# Patient Record
Sex: Female | Born: 1945 | Race: White | Hispanic: No | Smoking: Former smoker
Health system: Southern US, Community
[De-identification: ages and names within clinical notes are randomized; demographics above are authoritative.]

## PROBLEM LIST (undated history)

## (undated) DIAGNOSIS — I10 Essential (primary) hypertension: Secondary | ICD-10-CM

## (undated) DIAGNOSIS — K859 Acute pancreatitis without necrosis or infection, unspecified: Secondary | ICD-10-CM

## (undated) DIAGNOSIS — I4891 Unspecified atrial fibrillation: Secondary | ICD-10-CM

## (undated) DIAGNOSIS — B029 Zoster without complications: Secondary | ICD-10-CM

## (undated) HISTORY — DX: Zoster without complications: B02.9

## (undated) HISTORY — PX: CHOLECYSTECTOMY: SHX55

## (undated) HISTORY — DX: Acute pancreatitis without necrosis or infection, unspecified: K85.90

## (undated) HISTORY — DX: Essential (primary) hypertension: I10

## (undated) HISTORY — PX: OTHER SURGICAL HISTORY: SHX169

---

## 2012-01-29 DIAGNOSIS — I10 Essential (primary) hypertension: Secondary | ICD-10-CM | POA: Diagnosis not present

## 2012-05-06 DIAGNOSIS — J309 Allergic rhinitis, unspecified: Secondary | ICD-10-CM | POA: Diagnosis not present

## 2012-05-06 DIAGNOSIS — I10 Essential (primary) hypertension: Secondary | ICD-10-CM | POA: Diagnosis not present

## 2012-08-23 DIAGNOSIS — Z0000 Encounter for general adult medical examination without abnormal findings: Secondary | ICD-10-CM | POA: Diagnosis not present

## 2012-08-23 DIAGNOSIS — Z136 Encounter for screening for cardiovascular disorders: Secondary | ICD-10-CM | POA: Diagnosis not present

## 2012-08-23 DIAGNOSIS — I10 Essential (primary) hypertension: Secondary | ICD-10-CM | POA: Diagnosis not present

## 2012-08-23 DIAGNOSIS — Z13220 Encounter for screening for lipoid disorders: Secondary | ICD-10-CM | POA: Diagnosis not present

## 2012-11-25 DIAGNOSIS — I10 Essential (primary) hypertension: Secondary | ICD-10-CM | POA: Diagnosis not present

## 2013-05-09 DIAGNOSIS — I10 Essential (primary) hypertension: Secondary | ICD-10-CM | POA: Diagnosis not present

## 2013-08-09 DIAGNOSIS — I10 Essential (primary) hypertension: Secondary | ICD-10-CM | POA: Diagnosis not present

## 2013-08-09 DIAGNOSIS — Z131 Encounter for screening for diabetes mellitus: Secondary | ICD-10-CM | POA: Diagnosis not present

## 2013-11-17 DIAGNOSIS — I10 Essential (primary) hypertension: Secondary | ICD-10-CM | POA: Diagnosis not present

## 2014-01-20 DIAGNOSIS — M25569 Pain in unspecified knee: Secondary | ICD-10-CM | POA: Diagnosis not present

## 2014-01-20 DIAGNOSIS — J019 Acute sinusitis, unspecified: Secondary | ICD-10-CM | POA: Diagnosis not present

## 2014-05-25 DIAGNOSIS — Z1389 Encounter for screening for other disorder: Secondary | ICD-10-CM | POA: Diagnosis not present

## 2014-05-25 DIAGNOSIS — I10 Essential (primary) hypertension: Secondary | ICD-10-CM | POA: Diagnosis not present

## 2014-05-25 DIAGNOSIS — Z0000 Encounter for general adult medical examination without abnormal findings: Secondary | ICD-10-CM | POA: Diagnosis not present

## 2014-06-05 DIAGNOSIS — H25813 Combined forms of age-related cataract, bilateral: Secondary | ICD-10-CM | POA: Diagnosis not present

## 2014-06-20 DIAGNOSIS — Z1231 Encounter for screening mammogram for malignant neoplasm of breast: Secondary | ICD-10-CM | POA: Diagnosis not present

## 2014-06-20 DIAGNOSIS — Z23 Encounter for immunization: Secondary | ICD-10-CM | POA: Diagnosis not present

## 2014-07-14 HISTORY — PX: CATARACT EXTRACTION: SUR2

## 2014-11-29 DIAGNOSIS — H25813 Combined forms of age-related cataract, bilateral: Secondary | ICD-10-CM | POA: Diagnosis not present

## 2014-12-28 DIAGNOSIS — I10 Essential (primary) hypertension: Secondary | ICD-10-CM | POA: Diagnosis not present

## 2015-02-26 DIAGNOSIS — H18411 Arcus senilis, right eye: Secondary | ICD-10-CM | POA: Diagnosis not present

## 2015-02-26 DIAGNOSIS — H25011 Cortical age-related cataract, right eye: Secondary | ICD-10-CM | POA: Diagnosis not present

## 2015-02-26 DIAGNOSIS — H2511 Age-related nuclear cataract, right eye: Secondary | ICD-10-CM | POA: Diagnosis not present

## 2015-02-26 DIAGNOSIS — H269 Unspecified cataract: Secondary | ICD-10-CM | POA: Diagnosis not present

## 2015-02-26 DIAGNOSIS — H02839 Dermatochalasis of unspecified eye, unspecified eyelid: Secondary | ICD-10-CM | POA: Diagnosis not present

## 2015-03-30 DIAGNOSIS — N3080 Other cystitis without hematuria: Secondary | ICD-10-CM | POA: Diagnosis not present

## 2015-03-30 DIAGNOSIS — I10 Essential (primary) hypertension: Secondary | ICD-10-CM | POA: Diagnosis not present

## 2015-04-12 DIAGNOSIS — H2511 Age-related nuclear cataract, right eye: Secondary | ICD-10-CM | POA: Diagnosis not present

## 2015-04-12 DIAGNOSIS — H25811 Combined forms of age-related cataract, right eye: Secondary | ICD-10-CM | POA: Diagnosis not present

## 2015-04-13 DIAGNOSIS — H2512 Age-related nuclear cataract, left eye: Secondary | ICD-10-CM | POA: Diagnosis not present

## 2015-05-03 DIAGNOSIS — H25812 Combined forms of age-related cataract, left eye: Secondary | ICD-10-CM | POA: Diagnosis not present

## 2015-05-03 DIAGNOSIS — H2512 Age-related nuclear cataract, left eye: Secondary | ICD-10-CM | POA: Diagnosis not present

## 2015-07-02 DIAGNOSIS — Z131 Encounter for screening for diabetes mellitus: Secondary | ICD-10-CM | POA: Diagnosis not present

## 2015-07-02 DIAGNOSIS — Z0000 Encounter for general adult medical examination without abnormal findings: Secondary | ICD-10-CM | POA: Diagnosis not present

## 2015-07-02 DIAGNOSIS — Z23 Encounter for immunization: Secondary | ICD-10-CM | POA: Diagnosis not present

## 2015-07-02 DIAGNOSIS — Z1389 Encounter for screening for other disorder: Secondary | ICD-10-CM | POA: Diagnosis not present

## 2015-07-02 DIAGNOSIS — I10 Essential (primary) hypertension: Secondary | ICD-10-CM | POA: Diagnosis not present

## 2015-10-04 DIAGNOSIS — I10 Essential (primary) hypertension: Secondary | ICD-10-CM | POA: Diagnosis not present

## 2015-10-04 DIAGNOSIS — F3289 Other specified depressive episodes: Secondary | ICD-10-CM | POA: Diagnosis not present

## 2016-01-03 DIAGNOSIS — N952 Postmenopausal atrophic vaginitis: Secondary | ICD-10-CM | POA: Diagnosis not present

## 2016-01-03 DIAGNOSIS — I10 Essential (primary) hypertension: Secondary | ICD-10-CM | POA: Diagnosis not present

## 2016-01-03 DIAGNOSIS — F3289 Other specified depressive episodes: Secondary | ICD-10-CM | POA: Diagnosis not present

## 2016-04-07 DIAGNOSIS — I10 Essential (primary) hypertension: Secondary | ICD-10-CM | POA: Diagnosis not present

## 2016-04-07 DIAGNOSIS — F3289 Other specified depressive episodes: Secondary | ICD-10-CM | POA: Diagnosis not present

## 2016-07-10 DIAGNOSIS — Z1389 Encounter for screening for other disorder: Secondary | ICD-10-CM | POA: Diagnosis not present

## 2016-07-10 DIAGNOSIS — Z0000 Encounter for general adult medical examination without abnormal findings: Secondary | ICD-10-CM | POA: Diagnosis not present

## 2016-07-10 DIAGNOSIS — Z23 Encounter for immunization: Secondary | ICD-10-CM | POA: Diagnosis not present

## 2016-07-10 DIAGNOSIS — I10 Essential (primary) hypertension: Secondary | ICD-10-CM | POA: Diagnosis not present

## 2016-07-10 DIAGNOSIS — F3289 Other specified depressive episodes: Secondary | ICD-10-CM | POA: Diagnosis not present

## 2016-07-11 DIAGNOSIS — Z23 Encounter for immunization: Secondary | ICD-10-CM | POA: Diagnosis not present

## 2016-10-08 DIAGNOSIS — F3289 Other specified depressive episodes: Secondary | ICD-10-CM | POA: Diagnosis not present

## 2016-10-08 DIAGNOSIS — I10 Essential (primary) hypertension: Secondary | ICD-10-CM | POA: Diagnosis not present

## 2017-01-12 DIAGNOSIS — I10 Essential (primary) hypertension: Secondary | ICD-10-CM | POA: Diagnosis not present

## 2017-01-12 DIAGNOSIS — N3000 Acute cystitis without hematuria: Secondary | ICD-10-CM | POA: Diagnosis not present

## 2017-01-12 DIAGNOSIS — F3289 Other specified depressive episodes: Secondary | ICD-10-CM | POA: Diagnosis not present

## 2017-04-13 DIAGNOSIS — I10 Essential (primary) hypertension: Secondary | ICD-10-CM | POA: Diagnosis not present

## 2017-04-13 DIAGNOSIS — M5432 Sciatica, left side: Secondary | ICD-10-CM | POA: Diagnosis not present

## 2017-04-13 DIAGNOSIS — F3289 Other specified depressive episodes: Secondary | ICD-10-CM | POA: Diagnosis not present

## 2017-06-04 DIAGNOSIS — M79602 Pain in left arm: Secondary | ICD-10-CM | POA: Diagnosis not present

## 2017-06-04 DIAGNOSIS — S59902A Unspecified injury of left elbow, initial encounter: Secondary | ICD-10-CM | POA: Diagnosis not present

## 2017-06-04 DIAGNOSIS — T148XXA Other injury of unspecified body region, initial encounter: Secondary | ICD-10-CM | POA: Diagnosis not present

## 2017-06-04 DIAGNOSIS — M25522 Pain in left elbow: Secondary | ICD-10-CM | POA: Diagnosis not present

## 2017-06-04 DIAGNOSIS — I10 Essential (primary) hypertension: Secondary | ICD-10-CM | POA: Diagnosis not present

## 2017-06-04 DIAGNOSIS — S42212A Unspecified displaced fracture of surgical neck of left humerus, initial encounter for closed fracture: Secondary | ICD-10-CM | POA: Diagnosis not present

## 2017-06-04 DIAGNOSIS — W1830XA Fall on same level, unspecified, initial encounter: Secondary | ICD-10-CM | POA: Diagnosis not present

## 2017-06-04 DIAGNOSIS — S42202A Unspecified fracture of upper end of left humerus, initial encounter for closed fracture: Secondary | ICD-10-CM | POA: Diagnosis not present

## 2017-06-04 DIAGNOSIS — S42352A Displaced comminuted fracture of shaft of humerus, left arm, initial encounter for closed fracture: Secondary | ICD-10-CM | POA: Diagnosis not present

## 2017-06-04 DIAGNOSIS — Z885 Allergy status to narcotic agent status: Secondary | ICD-10-CM | POA: Diagnosis not present

## 2017-06-04 DIAGNOSIS — M79632 Pain in left forearm: Secondary | ICD-10-CM | POA: Diagnosis not present

## 2017-06-04 DIAGNOSIS — S59912A Unspecified injury of left forearm, initial encounter: Secondary | ICD-10-CM | POA: Diagnosis not present

## 2017-06-04 DIAGNOSIS — Z882 Allergy status to sulfonamides status: Secondary | ICD-10-CM | POA: Diagnosis not present

## 2017-06-05 DIAGNOSIS — S42202A Unspecified fracture of upper end of left humerus, initial encounter for closed fracture: Secondary | ICD-10-CM | POA: Diagnosis not present

## 2017-06-05 DIAGNOSIS — S4292XA Fracture of left shoulder girdle, part unspecified, initial encounter for closed fracture: Secondary | ICD-10-CM | POA: Diagnosis not present

## 2017-06-05 DIAGNOSIS — S143XXA Injury of brachial plexus, initial encounter: Secondary | ICD-10-CM | POA: Diagnosis not present

## 2017-06-05 DIAGNOSIS — I10 Essential (primary) hypertension: Secondary | ICD-10-CM | POA: Diagnosis not present

## 2017-06-05 DIAGNOSIS — Z01810 Encounter for preprocedural cardiovascular examination: Secondary | ICD-10-CM | POA: Diagnosis not present

## 2017-06-05 DIAGNOSIS — Z01818 Encounter for other preprocedural examination: Secondary | ICD-10-CM | POA: Diagnosis not present

## 2017-06-05 DIAGNOSIS — W11XXXA Fall on and from ladder, initial encounter: Secondary | ICD-10-CM | POA: Diagnosis not present

## 2017-06-05 DIAGNOSIS — S42112S Displaced fracture of body of scapula, left shoulder, sequela: Secondary | ICD-10-CM | POA: Diagnosis not present

## 2017-06-05 DIAGNOSIS — S42292A Other displaced fracture of upper end of left humerus, initial encounter for closed fracture: Secondary | ICD-10-CM | POA: Diagnosis not present

## 2017-06-05 DIAGNOSIS — Z0000 Encounter for general adult medical examination without abnormal findings: Secondary | ICD-10-CM | POA: Diagnosis not present

## 2017-06-05 DIAGNOSIS — R001 Bradycardia, unspecified: Secondary | ICD-10-CM | POA: Diagnosis not present

## 2017-06-09 DIAGNOSIS — S143XXA Injury of brachial plexus, initial encounter: Secondary | ICD-10-CM | POA: Diagnosis not present

## 2017-06-09 DIAGNOSIS — S43005A Unspecified dislocation of left shoulder joint, initial encounter: Secondary | ICD-10-CM | POA: Diagnosis not present

## 2017-06-09 DIAGNOSIS — S42202A Unspecified fracture of upper end of left humerus, initial encounter for closed fracture: Secondary | ICD-10-CM | POA: Diagnosis not present

## 2017-06-10 DIAGNOSIS — M84412A Pathological fracture, left shoulder, initial encounter for fracture: Secondary | ICD-10-CM | POA: Diagnosis not present

## 2017-06-10 DIAGNOSIS — S143XXA Injury of brachial plexus, initial encounter: Secondary | ICD-10-CM | POA: Diagnosis present

## 2017-06-10 DIAGNOSIS — S42202A Unspecified fracture of upper end of left humerus, initial encounter for closed fracture: Secondary | ICD-10-CM | POA: Diagnosis not present

## 2017-06-10 DIAGNOSIS — M25512 Pain in left shoulder: Secondary | ICD-10-CM | POA: Diagnosis not present

## 2017-06-10 DIAGNOSIS — S42242A 4-part fracture of surgical neck of left humerus, initial encounter for closed fracture: Secondary | ICD-10-CM | POA: Diagnosis present

## 2017-06-10 DIAGNOSIS — Z96612 Presence of left artificial shoulder joint: Secondary | ICD-10-CM | POA: Diagnosis not present

## 2017-06-10 DIAGNOSIS — I10 Essential (primary) hypertension: Secondary | ICD-10-CM | POA: Diagnosis present

## 2017-06-11 DIAGNOSIS — Z96612 Presence of left artificial shoulder joint: Secondary | ICD-10-CM | POA: Insufficient documentation

## 2017-06-12 DIAGNOSIS — Z96612 Presence of left artificial shoulder joint: Secondary | ICD-10-CM | POA: Diagnosis not present

## 2017-06-12 DIAGNOSIS — Z7982 Long term (current) use of aspirin: Secondary | ICD-10-CM | POA: Diagnosis not present

## 2017-06-12 DIAGNOSIS — Z79891 Long term (current) use of opiate analgesic: Secondary | ICD-10-CM | POA: Diagnosis not present

## 2017-06-12 DIAGNOSIS — R2689 Other abnormalities of gait and mobility: Secondary | ICD-10-CM | POA: Diagnosis not present

## 2017-06-12 DIAGNOSIS — Z471 Aftercare following joint replacement surgery: Secondary | ICD-10-CM | POA: Diagnosis not present

## 2017-06-12 DIAGNOSIS — Z9049 Acquired absence of other specified parts of digestive tract: Secondary | ICD-10-CM | POA: Diagnosis not present

## 2017-06-19 DIAGNOSIS — Z471 Aftercare following joint replacement surgery: Secondary | ICD-10-CM | POA: Diagnosis not present

## 2017-06-19 DIAGNOSIS — Z9049 Acquired absence of other specified parts of digestive tract: Secondary | ICD-10-CM | POA: Diagnosis not present

## 2017-06-19 DIAGNOSIS — Z96612 Presence of left artificial shoulder joint: Secondary | ICD-10-CM | POA: Diagnosis not present

## 2017-06-19 DIAGNOSIS — R2689 Other abnormalities of gait and mobility: Secondary | ICD-10-CM | POA: Diagnosis not present

## 2017-06-19 DIAGNOSIS — Z79891 Long term (current) use of opiate analgesic: Secondary | ICD-10-CM | POA: Diagnosis not present

## 2017-06-19 DIAGNOSIS — Z7982 Long term (current) use of aspirin: Secondary | ICD-10-CM | POA: Diagnosis not present

## 2017-06-25 DIAGNOSIS — R2689 Other abnormalities of gait and mobility: Secondary | ICD-10-CM | POA: Diagnosis not present

## 2017-06-25 DIAGNOSIS — Z7982 Long term (current) use of aspirin: Secondary | ICD-10-CM | POA: Diagnosis not present

## 2017-06-25 DIAGNOSIS — Z96612 Presence of left artificial shoulder joint: Secondary | ICD-10-CM | POA: Diagnosis not present

## 2017-06-25 DIAGNOSIS — Z9049 Acquired absence of other specified parts of digestive tract: Secondary | ICD-10-CM | POA: Diagnosis not present

## 2017-06-25 DIAGNOSIS — Z79891 Long term (current) use of opiate analgesic: Secondary | ICD-10-CM | POA: Diagnosis not present

## 2017-06-25 DIAGNOSIS — Z471 Aftercare following joint replacement surgery: Secondary | ICD-10-CM | POA: Diagnosis not present

## 2017-06-29 DIAGNOSIS — Z471 Aftercare following joint replacement surgery: Secondary | ICD-10-CM | POA: Diagnosis not present

## 2017-07-01 DIAGNOSIS — Z7982 Long term (current) use of aspirin: Secondary | ICD-10-CM | POA: Diagnosis not present

## 2017-07-01 DIAGNOSIS — Z96612 Presence of left artificial shoulder joint: Secondary | ICD-10-CM | POA: Diagnosis not present

## 2017-07-01 DIAGNOSIS — Z9049 Acquired absence of other specified parts of digestive tract: Secondary | ICD-10-CM | POA: Diagnosis not present

## 2017-07-01 DIAGNOSIS — Z79891 Long term (current) use of opiate analgesic: Secondary | ICD-10-CM | POA: Diagnosis not present

## 2017-07-01 DIAGNOSIS — R2689 Other abnormalities of gait and mobility: Secondary | ICD-10-CM | POA: Diagnosis not present

## 2017-07-01 DIAGNOSIS — Z471 Aftercare following joint replacement surgery: Secondary | ICD-10-CM | POA: Diagnosis not present

## 2017-07-10 DIAGNOSIS — Z96612 Presence of left artificial shoulder joint: Secondary | ICD-10-CM | POA: Diagnosis not present

## 2017-07-10 DIAGNOSIS — Z471 Aftercare following joint replacement surgery: Secondary | ICD-10-CM | POA: Diagnosis not present

## 2017-07-10 DIAGNOSIS — R2689 Other abnormalities of gait and mobility: Secondary | ICD-10-CM | POA: Diagnosis not present

## 2017-07-10 DIAGNOSIS — Z9049 Acquired absence of other specified parts of digestive tract: Secondary | ICD-10-CM | POA: Diagnosis not present

## 2017-07-10 DIAGNOSIS — Z79891 Long term (current) use of opiate analgesic: Secondary | ICD-10-CM | POA: Diagnosis not present

## 2017-07-10 DIAGNOSIS — Z7982 Long term (current) use of aspirin: Secondary | ICD-10-CM | POA: Diagnosis not present

## 2017-07-15 DIAGNOSIS — I10 Essential (primary) hypertension: Secondary | ICD-10-CM | POA: Diagnosis not present

## 2017-07-15 DIAGNOSIS — M5432 Sciatica, left side: Secondary | ICD-10-CM | POA: Diagnosis not present

## 2017-07-15 DIAGNOSIS — F3289 Other specified depressive episodes: Secondary | ICD-10-CM | POA: Diagnosis not present

## 2017-07-15 DIAGNOSIS — M14812 Arthropathies in other specified diseases classified elsewhere, left shoulder: Secondary | ICD-10-CM | POA: Diagnosis not present

## 2017-07-16 DIAGNOSIS — Z7982 Long term (current) use of aspirin: Secondary | ICD-10-CM | POA: Diagnosis not present

## 2017-07-16 DIAGNOSIS — Z96612 Presence of left artificial shoulder joint: Secondary | ICD-10-CM | POA: Diagnosis not present

## 2017-07-16 DIAGNOSIS — Z79891 Long term (current) use of opiate analgesic: Secondary | ICD-10-CM | POA: Diagnosis not present

## 2017-07-16 DIAGNOSIS — R2689 Other abnormalities of gait and mobility: Secondary | ICD-10-CM | POA: Diagnosis not present

## 2017-07-16 DIAGNOSIS — Z471 Aftercare following joint replacement surgery: Secondary | ICD-10-CM | POA: Diagnosis not present

## 2017-07-16 DIAGNOSIS — Z9049 Acquired absence of other specified parts of digestive tract: Secondary | ICD-10-CM | POA: Diagnosis not present

## 2017-07-22 DIAGNOSIS — Z471 Aftercare following joint replacement surgery: Secondary | ICD-10-CM | POA: Diagnosis not present

## 2017-07-22 DIAGNOSIS — Z9049 Acquired absence of other specified parts of digestive tract: Secondary | ICD-10-CM | POA: Diagnosis not present

## 2017-07-22 DIAGNOSIS — R2689 Other abnormalities of gait and mobility: Secondary | ICD-10-CM | POA: Diagnosis not present

## 2017-07-22 DIAGNOSIS — Z7982 Long term (current) use of aspirin: Secondary | ICD-10-CM | POA: Diagnosis not present

## 2017-07-22 DIAGNOSIS — Z79891 Long term (current) use of opiate analgesic: Secondary | ICD-10-CM | POA: Diagnosis not present

## 2017-07-22 DIAGNOSIS — Z96612 Presence of left artificial shoulder joint: Secondary | ICD-10-CM | POA: Diagnosis not present

## 2017-07-24 DIAGNOSIS — Z7982 Long term (current) use of aspirin: Secondary | ICD-10-CM | POA: Diagnosis not present

## 2017-07-24 DIAGNOSIS — Z79891 Long term (current) use of opiate analgesic: Secondary | ICD-10-CM | POA: Diagnosis not present

## 2017-07-24 DIAGNOSIS — Z9049 Acquired absence of other specified parts of digestive tract: Secondary | ICD-10-CM | POA: Diagnosis not present

## 2017-07-24 DIAGNOSIS — Z471 Aftercare following joint replacement surgery: Secondary | ICD-10-CM | POA: Diagnosis not present

## 2017-07-24 DIAGNOSIS — R2689 Other abnormalities of gait and mobility: Secondary | ICD-10-CM | POA: Diagnosis not present

## 2017-07-24 DIAGNOSIS — Z96612 Presence of left artificial shoulder joint: Secondary | ICD-10-CM | POA: Diagnosis not present

## 2017-07-27 DIAGNOSIS — Z9049 Acquired absence of other specified parts of digestive tract: Secondary | ICD-10-CM | POA: Diagnosis not present

## 2017-07-27 DIAGNOSIS — R2689 Other abnormalities of gait and mobility: Secondary | ICD-10-CM | POA: Diagnosis not present

## 2017-07-27 DIAGNOSIS — Z7982 Long term (current) use of aspirin: Secondary | ICD-10-CM | POA: Diagnosis not present

## 2017-07-27 DIAGNOSIS — Z79891 Long term (current) use of opiate analgesic: Secondary | ICD-10-CM | POA: Diagnosis not present

## 2017-07-27 DIAGNOSIS — Z96612 Presence of left artificial shoulder joint: Secondary | ICD-10-CM | POA: Diagnosis not present

## 2017-07-27 DIAGNOSIS — Z471 Aftercare following joint replacement surgery: Secondary | ICD-10-CM | POA: Diagnosis not present

## 2017-07-28 DIAGNOSIS — Z96612 Presence of left artificial shoulder joint: Secondary | ICD-10-CM | POA: Diagnosis not present

## 2017-07-28 DIAGNOSIS — Z471 Aftercare following joint replacement surgery: Secondary | ICD-10-CM | POA: Diagnosis not present

## 2017-07-30 DIAGNOSIS — Z7982 Long term (current) use of aspirin: Secondary | ICD-10-CM | POA: Diagnosis not present

## 2017-07-30 DIAGNOSIS — Z96612 Presence of left artificial shoulder joint: Secondary | ICD-10-CM | POA: Diagnosis not present

## 2017-07-30 DIAGNOSIS — Z9049 Acquired absence of other specified parts of digestive tract: Secondary | ICD-10-CM | POA: Diagnosis not present

## 2017-07-30 DIAGNOSIS — R2689 Other abnormalities of gait and mobility: Secondary | ICD-10-CM | POA: Diagnosis not present

## 2017-07-30 DIAGNOSIS — Z471 Aftercare following joint replacement surgery: Secondary | ICD-10-CM | POA: Diagnosis not present

## 2017-07-30 DIAGNOSIS — Z79891 Long term (current) use of opiate analgesic: Secondary | ICD-10-CM | POA: Diagnosis not present

## 2017-07-31 DIAGNOSIS — Z79891 Long term (current) use of opiate analgesic: Secondary | ICD-10-CM | POA: Diagnosis not present

## 2017-07-31 DIAGNOSIS — R2689 Other abnormalities of gait and mobility: Secondary | ICD-10-CM | POA: Diagnosis not present

## 2017-07-31 DIAGNOSIS — Z9049 Acquired absence of other specified parts of digestive tract: Secondary | ICD-10-CM | POA: Diagnosis not present

## 2017-07-31 DIAGNOSIS — Z96612 Presence of left artificial shoulder joint: Secondary | ICD-10-CM | POA: Diagnosis not present

## 2017-07-31 DIAGNOSIS — Z7982 Long term (current) use of aspirin: Secondary | ICD-10-CM | POA: Diagnosis not present

## 2017-07-31 DIAGNOSIS — Z471 Aftercare following joint replacement surgery: Secondary | ICD-10-CM | POA: Diagnosis not present

## 2017-08-04 DIAGNOSIS — Z7982 Long term (current) use of aspirin: Secondary | ICD-10-CM | POA: Diagnosis not present

## 2017-08-04 DIAGNOSIS — Z9049 Acquired absence of other specified parts of digestive tract: Secondary | ICD-10-CM | POA: Diagnosis not present

## 2017-08-04 DIAGNOSIS — Z79891 Long term (current) use of opiate analgesic: Secondary | ICD-10-CM | POA: Diagnosis not present

## 2017-08-04 DIAGNOSIS — Z471 Aftercare following joint replacement surgery: Secondary | ICD-10-CM | POA: Diagnosis not present

## 2017-08-04 DIAGNOSIS — R2689 Other abnormalities of gait and mobility: Secondary | ICD-10-CM | POA: Diagnosis not present

## 2017-08-04 DIAGNOSIS — Z96612 Presence of left artificial shoulder joint: Secondary | ICD-10-CM | POA: Diagnosis not present

## 2017-08-06 DIAGNOSIS — Z9049 Acquired absence of other specified parts of digestive tract: Secondary | ICD-10-CM | POA: Diagnosis not present

## 2017-08-06 DIAGNOSIS — R2689 Other abnormalities of gait and mobility: Secondary | ICD-10-CM | POA: Diagnosis not present

## 2017-08-06 DIAGNOSIS — Z7982 Long term (current) use of aspirin: Secondary | ICD-10-CM | POA: Diagnosis not present

## 2017-08-06 DIAGNOSIS — Z79891 Long term (current) use of opiate analgesic: Secondary | ICD-10-CM | POA: Diagnosis not present

## 2017-08-06 DIAGNOSIS — Z96612 Presence of left artificial shoulder joint: Secondary | ICD-10-CM | POA: Diagnosis not present

## 2017-08-06 DIAGNOSIS — Z471 Aftercare following joint replacement surgery: Secondary | ICD-10-CM | POA: Diagnosis not present

## 2017-08-07 DIAGNOSIS — R2689 Other abnormalities of gait and mobility: Secondary | ICD-10-CM | POA: Diagnosis not present

## 2017-08-07 DIAGNOSIS — Z7982 Long term (current) use of aspirin: Secondary | ICD-10-CM | POA: Diagnosis not present

## 2017-08-07 DIAGNOSIS — Z79891 Long term (current) use of opiate analgesic: Secondary | ICD-10-CM | POA: Diagnosis not present

## 2017-08-07 DIAGNOSIS — Z9049 Acquired absence of other specified parts of digestive tract: Secondary | ICD-10-CM | POA: Diagnosis not present

## 2017-08-07 DIAGNOSIS — Z471 Aftercare following joint replacement surgery: Secondary | ICD-10-CM | POA: Diagnosis not present

## 2017-08-07 DIAGNOSIS — Z96612 Presence of left artificial shoulder joint: Secondary | ICD-10-CM | POA: Diagnosis not present

## 2017-08-10 DIAGNOSIS — Z7982 Long term (current) use of aspirin: Secondary | ICD-10-CM | POA: Diagnosis not present

## 2017-08-10 DIAGNOSIS — Z9049 Acquired absence of other specified parts of digestive tract: Secondary | ICD-10-CM | POA: Diagnosis not present

## 2017-08-10 DIAGNOSIS — R2689 Other abnormalities of gait and mobility: Secondary | ICD-10-CM | POA: Diagnosis not present

## 2017-08-10 DIAGNOSIS — Z79891 Long term (current) use of opiate analgesic: Secondary | ICD-10-CM | POA: Diagnosis not present

## 2017-08-10 DIAGNOSIS — Z96612 Presence of left artificial shoulder joint: Secondary | ICD-10-CM | POA: Diagnosis not present

## 2017-08-10 DIAGNOSIS — Z471 Aftercare following joint replacement surgery: Secondary | ICD-10-CM | POA: Diagnosis not present

## 2017-08-11 DIAGNOSIS — I10 Essential (primary) hypertension: Secondary | ICD-10-CM | POA: Diagnosis not present

## 2017-08-11 DIAGNOSIS — Z9049 Acquired absence of other specified parts of digestive tract: Secondary | ICD-10-CM | POA: Diagnosis not present

## 2017-08-11 DIAGNOSIS — Z7982 Long term (current) use of aspirin: Secondary | ICD-10-CM | POA: Diagnosis not present

## 2017-08-11 DIAGNOSIS — R2689 Other abnormalities of gait and mobility: Secondary | ICD-10-CM | POA: Diagnosis not present

## 2017-08-11 DIAGNOSIS — Z96612 Presence of left artificial shoulder joint: Secondary | ICD-10-CM | POA: Diagnosis not present

## 2017-08-11 DIAGNOSIS — Z471 Aftercare following joint replacement surgery: Secondary | ICD-10-CM | POA: Diagnosis not present

## 2017-08-11 DIAGNOSIS — Z79891 Long term (current) use of opiate analgesic: Secondary | ICD-10-CM | POA: Diagnosis not present

## 2017-08-13 DIAGNOSIS — Z9049 Acquired absence of other specified parts of digestive tract: Secondary | ICD-10-CM | POA: Diagnosis not present

## 2017-08-13 DIAGNOSIS — Z96612 Presence of left artificial shoulder joint: Secondary | ICD-10-CM | POA: Diagnosis not present

## 2017-08-13 DIAGNOSIS — Z79891 Long term (current) use of opiate analgesic: Secondary | ICD-10-CM | POA: Diagnosis not present

## 2017-08-13 DIAGNOSIS — R2689 Other abnormalities of gait and mobility: Secondary | ICD-10-CM | POA: Diagnosis not present

## 2017-08-13 DIAGNOSIS — Z471 Aftercare following joint replacement surgery: Secondary | ICD-10-CM | POA: Diagnosis not present

## 2017-08-13 DIAGNOSIS — Z7982 Long term (current) use of aspirin: Secondary | ICD-10-CM | POA: Diagnosis not present

## 2017-08-14 DIAGNOSIS — Z7982 Long term (current) use of aspirin: Secondary | ICD-10-CM | POA: Diagnosis not present

## 2017-08-14 DIAGNOSIS — R2689 Other abnormalities of gait and mobility: Secondary | ICD-10-CM | POA: Diagnosis not present

## 2017-08-14 DIAGNOSIS — Z471 Aftercare following joint replacement surgery: Secondary | ICD-10-CM | POA: Diagnosis not present

## 2017-08-14 DIAGNOSIS — Z9049 Acquired absence of other specified parts of digestive tract: Secondary | ICD-10-CM | POA: Diagnosis not present

## 2017-08-14 DIAGNOSIS — Z79891 Long term (current) use of opiate analgesic: Secondary | ICD-10-CM | POA: Diagnosis not present

## 2017-08-14 DIAGNOSIS — Z96612 Presence of left artificial shoulder joint: Secondary | ICD-10-CM | POA: Diagnosis not present

## 2017-08-17 DIAGNOSIS — Z7982 Long term (current) use of aspirin: Secondary | ICD-10-CM | POA: Diagnosis not present

## 2017-08-17 DIAGNOSIS — Z471 Aftercare following joint replacement surgery: Secondary | ICD-10-CM | POA: Diagnosis not present

## 2017-08-17 DIAGNOSIS — Z96612 Presence of left artificial shoulder joint: Secondary | ICD-10-CM | POA: Diagnosis not present

## 2017-08-17 DIAGNOSIS — R2689 Other abnormalities of gait and mobility: Secondary | ICD-10-CM | POA: Diagnosis not present

## 2017-08-17 DIAGNOSIS — Z79891 Long term (current) use of opiate analgesic: Secondary | ICD-10-CM | POA: Diagnosis not present

## 2017-08-17 DIAGNOSIS — Z9049 Acquired absence of other specified parts of digestive tract: Secondary | ICD-10-CM | POA: Diagnosis not present

## 2017-08-19 DIAGNOSIS — Z96612 Presence of left artificial shoulder joint: Secondary | ICD-10-CM | POA: Diagnosis not present

## 2017-08-19 DIAGNOSIS — Z7982 Long term (current) use of aspirin: Secondary | ICD-10-CM | POA: Diagnosis not present

## 2017-08-19 DIAGNOSIS — R2689 Other abnormalities of gait and mobility: Secondary | ICD-10-CM | POA: Diagnosis not present

## 2017-08-19 DIAGNOSIS — Z9049 Acquired absence of other specified parts of digestive tract: Secondary | ICD-10-CM | POA: Diagnosis not present

## 2017-08-19 DIAGNOSIS — Z79891 Long term (current) use of opiate analgesic: Secondary | ICD-10-CM | POA: Diagnosis not present

## 2017-08-19 DIAGNOSIS — Z471 Aftercare following joint replacement surgery: Secondary | ICD-10-CM | POA: Diagnosis not present

## 2017-08-25 DIAGNOSIS — J40 Bronchitis, not specified as acute or chronic: Secondary | ICD-10-CM | POA: Diagnosis not present

## 2017-08-27 DIAGNOSIS — Z471 Aftercare following joint replacement surgery: Secondary | ICD-10-CM | POA: Diagnosis not present

## 2017-08-27 DIAGNOSIS — R2689 Other abnormalities of gait and mobility: Secondary | ICD-10-CM | POA: Diagnosis not present

## 2017-08-27 DIAGNOSIS — Z79891 Long term (current) use of opiate analgesic: Secondary | ICD-10-CM | POA: Diagnosis not present

## 2017-08-27 DIAGNOSIS — Z7982 Long term (current) use of aspirin: Secondary | ICD-10-CM | POA: Diagnosis not present

## 2017-08-27 DIAGNOSIS — Z96612 Presence of left artificial shoulder joint: Secondary | ICD-10-CM | POA: Diagnosis not present

## 2017-08-27 DIAGNOSIS — Z9049 Acquired absence of other specified parts of digestive tract: Secondary | ICD-10-CM | POA: Diagnosis not present

## 2017-08-31 DIAGNOSIS — Z7982 Long term (current) use of aspirin: Secondary | ICD-10-CM | POA: Diagnosis not present

## 2017-08-31 DIAGNOSIS — Z9049 Acquired absence of other specified parts of digestive tract: Secondary | ICD-10-CM | POA: Diagnosis not present

## 2017-08-31 DIAGNOSIS — Z471 Aftercare following joint replacement surgery: Secondary | ICD-10-CM | POA: Diagnosis not present

## 2017-08-31 DIAGNOSIS — Z79891 Long term (current) use of opiate analgesic: Secondary | ICD-10-CM | POA: Diagnosis not present

## 2017-08-31 DIAGNOSIS — Z96612 Presence of left artificial shoulder joint: Secondary | ICD-10-CM | POA: Diagnosis not present

## 2017-08-31 DIAGNOSIS — R2689 Other abnormalities of gait and mobility: Secondary | ICD-10-CM | POA: Diagnosis not present

## 2017-09-02 DIAGNOSIS — Z79891 Long term (current) use of opiate analgesic: Secondary | ICD-10-CM | POA: Diagnosis not present

## 2017-09-02 DIAGNOSIS — R2689 Other abnormalities of gait and mobility: Secondary | ICD-10-CM | POA: Diagnosis not present

## 2017-09-02 DIAGNOSIS — Z9049 Acquired absence of other specified parts of digestive tract: Secondary | ICD-10-CM | POA: Diagnosis not present

## 2017-09-02 DIAGNOSIS — Z7982 Long term (current) use of aspirin: Secondary | ICD-10-CM | POA: Diagnosis not present

## 2017-09-02 DIAGNOSIS — Z471 Aftercare following joint replacement surgery: Secondary | ICD-10-CM | POA: Diagnosis not present

## 2017-09-02 DIAGNOSIS — Z96612 Presence of left artificial shoulder joint: Secondary | ICD-10-CM | POA: Diagnosis not present

## 2017-09-04 DIAGNOSIS — R2689 Other abnormalities of gait and mobility: Secondary | ICD-10-CM | POA: Diagnosis not present

## 2017-09-04 DIAGNOSIS — Z471 Aftercare following joint replacement surgery: Secondary | ICD-10-CM | POA: Diagnosis not present

## 2017-09-04 DIAGNOSIS — Z96612 Presence of left artificial shoulder joint: Secondary | ICD-10-CM | POA: Diagnosis not present

## 2017-09-04 DIAGNOSIS — Z9049 Acquired absence of other specified parts of digestive tract: Secondary | ICD-10-CM | POA: Diagnosis not present

## 2017-09-04 DIAGNOSIS — Z79891 Long term (current) use of opiate analgesic: Secondary | ICD-10-CM | POA: Diagnosis not present

## 2017-09-04 DIAGNOSIS — Z7982 Long term (current) use of aspirin: Secondary | ICD-10-CM | POA: Diagnosis not present

## 2017-09-08 DIAGNOSIS — S42292A Other displaced fracture of upper end of left humerus, initial encounter for closed fracture: Secondary | ICD-10-CM | POA: Diagnosis not present

## 2017-09-08 DIAGNOSIS — Z96612 Presence of left artificial shoulder joint: Secondary | ICD-10-CM | POA: Diagnosis not present

## 2017-09-08 DIAGNOSIS — Z471 Aftercare following joint replacement surgery: Secondary | ICD-10-CM | POA: Diagnosis not present

## 2017-09-08 DIAGNOSIS — Z0000 Encounter for general adult medical examination without abnormal findings: Secondary | ICD-10-CM | POA: Diagnosis not present

## 2017-09-09 DIAGNOSIS — Z471 Aftercare following joint replacement surgery: Secondary | ICD-10-CM | POA: Diagnosis not present

## 2017-09-09 DIAGNOSIS — Z7982 Long term (current) use of aspirin: Secondary | ICD-10-CM | POA: Diagnosis not present

## 2017-09-09 DIAGNOSIS — Z96612 Presence of left artificial shoulder joint: Secondary | ICD-10-CM | POA: Diagnosis not present

## 2017-09-09 DIAGNOSIS — Z9049 Acquired absence of other specified parts of digestive tract: Secondary | ICD-10-CM | POA: Diagnosis not present

## 2017-09-09 DIAGNOSIS — R2689 Other abnormalities of gait and mobility: Secondary | ICD-10-CM | POA: Diagnosis not present

## 2017-09-09 DIAGNOSIS — Z79891 Long term (current) use of opiate analgesic: Secondary | ICD-10-CM | POA: Diagnosis not present

## 2017-09-11 DIAGNOSIS — Z7982 Long term (current) use of aspirin: Secondary | ICD-10-CM | POA: Diagnosis not present

## 2017-09-11 DIAGNOSIS — Z96612 Presence of left artificial shoulder joint: Secondary | ICD-10-CM | POA: Diagnosis not present

## 2017-09-11 DIAGNOSIS — Z9049 Acquired absence of other specified parts of digestive tract: Secondary | ICD-10-CM | POA: Diagnosis not present

## 2017-09-11 DIAGNOSIS — R2689 Other abnormalities of gait and mobility: Secondary | ICD-10-CM | POA: Diagnosis not present

## 2017-09-11 DIAGNOSIS — Z79891 Long term (current) use of opiate analgesic: Secondary | ICD-10-CM | POA: Diagnosis not present

## 2017-09-11 DIAGNOSIS — Z471 Aftercare following joint replacement surgery: Secondary | ICD-10-CM | POA: Diagnosis not present

## 2017-09-15 DIAGNOSIS — Z471 Aftercare following joint replacement surgery: Secondary | ICD-10-CM | POA: Diagnosis not present

## 2017-09-15 DIAGNOSIS — R2689 Other abnormalities of gait and mobility: Secondary | ICD-10-CM | POA: Diagnosis not present

## 2017-09-15 DIAGNOSIS — Z7982 Long term (current) use of aspirin: Secondary | ICD-10-CM | POA: Diagnosis not present

## 2017-09-15 DIAGNOSIS — Z9049 Acquired absence of other specified parts of digestive tract: Secondary | ICD-10-CM | POA: Diagnosis not present

## 2017-09-15 DIAGNOSIS — Z79891 Long term (current) use of opiate analgesic: Secondary | ICD-10-CM | POA: Diagnosis not present

## 2017-09-15 DIAGNOSIS — Z96612 Presence of left artificial shoulder joint: Secondary | ICD-10-CM | POA: Diagnosis not present

## 2017-09-18 DIAGNOSIS — Z96612 Presence of left artificial shoulder joint: Secondary | ICD-10-CM | POA: Diagnosis not present

## 2017-09-18 DIAGNOSIS — Z7982 Long term (current) use of aspirin: Secondary | ICD-10-CM | POA: Diagnosis not present

## 2017-09-18 DIAGNOSIS — R2689 Other abnormalities of gait and mobility: Secondary | ICD-10-CM | POA: Diagnosis not present

## 2017-09-18 DIAGNOSIS — Z9049 Acquired absence of other specified parts of digestive tract: Secondary | ICD-10-CM | POA: Diagnosis not present

## 2017-09-18 DIAGNOSIS — Z79891 Long term (current) use of opiate analgesic: Secondary | ICD-10-CM | POA: Diagnosis not present

## 2017-09-18 DIAGNOSIS — Z471 Aftercare following joint replacement surgery: Secondary | ICD-10-CM | POA: Diagnosis not present

## 2017-09-23 DIAGNOSIS — Z79891 Long term (current) use of opiate analgesic: Secondary | ICD-10-CM | POA: Diagnosis not present

## 2017-09-23 DIAGNOSIS — Z471 Aftercare following joint replacement surgery: Secondary | ICD-10-CM | POA: Diagnosis not present

## 2017-09-23 DIAGNOSIS — Z7982 Long term (current) use of aspirin: Secondary | ICD-10-CM | POA: Diagnosis not present

## 2017-09-23 DIAGNOSIS — R2689 Other abnormalities of gait and mobility: Secondary | ICD-10-CM | POA: Diagnosis not present

## 2017-09-23 DIAGNOSIS — Z96612 Presence of left artificial shoulder joint: Secondary | ICD-10-CM | POA: Diagnosis not present

## 2017-09-23 DIAGNOSIS — Z9049 Acquired absence of other specified parts of digestive tract: Secondary | ICD-10-CM | POA: Diagnosis not present

## 2017-09-24 DIAGNOSIS — Z96612 Presence of left artificial shoulder joint: Secondary | ICD-10-CM | POA: Diagnosis not present

## 2017-09-24 DIAGNOSIS — Z7982 Long term (current) use of aspirin: Secondary | ICD-10-CM | POA: Diagnosis not present

## 2017-09-24 DIAGNOSIS — Z471 Aftercare following joint replacement surgery: Secondary | ICD-10-CM | POA: Diagnosis not present

## 2017-09-24 DIAGNOSIS — Z9049 Acquired absence of other specified parts of digestive tract: Secondary | ICD-10-CM | POA: Diagnosis not present

## 2017-09-24 DIAGNOSIS — R2689 Other abnormalities of gait and mobility: Secondary | ICD-10-CM | POA: Diagnosis not present

## 2017-09-24 DIAGNOSIS — Z79891 Long term (current) use of opiate analgesic: Secondary | ICD-10-CM | POA: Diagnosis not present

## 2017-09-28 DIAGNOSIS — Z471 Aftercare following joint replacement surgery: Secondary | ICD-10-CM | POA: Diagnosis not present

## 2017-09-28 DIAGNOSIS — Z9049 Acquired absence of other specified parts of digestive tract: Secondary | ICD-10-CM | POA: Diagnosis not present

## 2017-09-28 DIAGNOSIS — Z96612 Presence of left artificial shoulder joint: Secondary | ICD-10-CM | POA: Diagnosis not present

## 2017-09-28 DIAGNOSIS — Z79891 Long term (current) use of opiate analgesic: Secondary | ICD-10-CM | POA: Diagnosis not present

## 2017-09-28 DIAGNOSIS — R2689 Other abnormalities of gait and mobility: Secondary | ICD-10-CM | POA: Diagnosis not present

## 2017-09-28 DIAGNOSIS — Z7982 Long term (current) use of aspirin: Secondary | ICD-10-CM | POA: Diagnosis not present

## 2017-09-30 DIAGNOSIS — R2689 Other abnormalities of gait and mobility: Secondary | ICD-10-CM | POA: Diagnosis not present

## 2017-09-30 DIAGNOSIS — Z96612 Presence of left artificial shoulder joint: Secondary | ICD-10-CM | POA: Diagnosis not present

## 2017-09-30 DIAGNOSIS — Z7982 Long term (current) use of aspirin: Secondary | ICD-10-CM | POA: Diagnosis not present

## 2017-09-30 DIAGNOSIS — Z471 Aftercare following joint replacement surgery: Secondary | ICD-10-CM | POA: Diagnosis not present

## 2017-09-30 DIAGNOSIS — Z79891 Long term (current) use of opiate analgesic: Secondary | ICD-10-CM | POA: Diagnosis not present

## 2017-09-30 DIAGNOSIS — Z9049 Acquired absence of other specified parts of digestive tract: Secondary | ICD-10-CM | POA: Diagnosis not present

## 2017-10-02 DIAGNOSIS — Z7982 Long term (current) use of aspirin: Secondary | ICD-10-CM | POA: Diagnosis not present

## 2017-10-02 DIAGNOSIS — Z79891 Long term (current) use of opiate analgesic: Secondary | ICD-10-CM | POA: Diagnosis not present

## 2017-10-02 DIAGNOSIS — Z9049 Acquired absence of other specified parts of digestive tract: Secondary | ICD-10-CM | POA: Diagnosis not present

## 2017-10-02 DIAGNOSIS — R2689 Other abnormalities of gait and mobility: Secondary | ICD-10-CM | POA: Diagnosis not present

## 2017-10-02 DIAGNOSIS — Z96612 Presence of left artificial shoulder joint: Secondary | ICD-10-CM | POA: Diagnosis not present

## 2017-10-02 DIAGNOSIS — Z471 Aftercare following joint replacement surgery: Secondary | ICD-10-CM | POA: Diagnosis not present

## 2017-10-05 DIAGNOSIS — Z7982 Long term (current) use of aspirin: Secondary | ICD-10-CM | POA: Diagnosis not present

## 2017-10-05 DIAGNOSIS — Z471 Aftercare following joint replacement surgery: Secondary | ICD-10-CM | POA: Diagnosis not present

## 2017-10-05 DIAGNOSIS — Z79891 Long term (current) use of opiate analgesic: Secondary | ICD-10-CM | POA: Diagnosis not present

## 2017-10-05 DIAGNOSIS — Z9049 Acquired absence of other specified parts of digestive tract: Secondary | ICD-10-CM | POA: Diagnosis not present

## 2017-10-05 DIAGNOSIS — Z96612 Presence of left artificial shoulder joint: Secondary | ICD-10-CM | POA: Diagnosis not present

## 2017-10-05 DIAGNOSIS — R2689 Other abnormalities of gait and mobility: Secondary | ICD-10-CM | POA: Diagnosis not present

## 2017-10-07 DIAGNOSIS — R2689 Other abnormalities of gait and mobility: Secondary | ICD-10-CM | POA: Diagnosis not present

## 2017-10-07 DIAGNOSIS — Z79891 Long term (current) use of opiate analgesic: Secondary | ICD-10-CM | POA: Diagnosis not present

## 2017-10-07 DIAGNOSIS — Z9049 Acquired absence of other specified parts of digestive tract: Secondary | ICD-10-CM | POA: Diagnosis not present

## 2017-10-07 DIAGNOSIS — Z471 Aftercare following joint replacement surgery: Secondary | ICD-10-CM | POA: Diagnosis not present

## 2017-10-07 DIAGNOSIS — Z7982 Long term (current) use of aspirin: Secondary | ICD-10-CM | POA: Diagnosis not present

## 2017-10-07 DIAGNOSIS — Z96612 Presence of left artificial shoulder joint: Secondary | ICD-10-CM | POA: Diagnosis not present

## 2017-10-09 DIAGNOSIS — Z471 Aftercare following joint replacement surgery: Secondary | ICD-10-CM | POA: Diagnosis not present

## 2017-10-09 DIAGNOSIS — Z79891 Long term (current) use of opiate analgesic: Secondary | ICD-10-CM | POA: Diagnosis not present

## 2017-10-09 DIAGNOSIS — Z96612 Presence of left artificial shoulder joint: Secondary | ICD-10-CM | POA: Diagnosis not present

## 2017-10-09 DIAGNOSIS — R2689 Other abnormalities of gait and mobility: Secondary | ICD-10-CM | POA: Diagnosis not present

## 2017-10-09 DIAGNOSIS — Z9049 Acquired absence of other specified parts of digestive tract: Secondary | ICD-10-CM | POA: Diagnosis not present

## 2017-10-09 DIAGNOSIS — Z7982 Long term (current) use of aspirin: Secondary | ICD-10-CM | POA: Diagnosis not present

## 2017-10-13 DIAGNOSIS — M6281 Muscle weakness (generalized): Secondary | ICD-10-CM | POA: Diagnosis not present

## 2017-10-13 DIAGNOSIS — M25612 Stiffness of left shoulder, not elsewhere classified: Secondary | ICD-10-CM | POA: Diagnosis not present

## 2017-10-14 DIAGNOSIS — Z0000 Encounter for general adult medical examination without abnormal findings: Secondary | ICD-10-CM | POA: Diagnosis not present

## 2017-10-14 DIAGNOSIS — F329 Major depressive disorder, single episode, unspecified: Secondary | ICD-10-CM | POA: Diagnosis not present

## 2017-10-14 DIAGNOSIS — I10 Essential (primary) hypertension: Secondary | ICD-10-CM | POA: Diagnosis not present

## 2017-10-14 DIAGNOSIS — Z1389 Encounter for screening for other disorder: Secondary | ICD-10-CM | POA: Diagnosis not present

## 2017-10-15 DIAGNOSIS — M6281 Muscle weakness (generalized): Secondary | ICD-10-CM | POA: Diagnosis not present

## 2017-10-15 DIAGNOSIS — M25612 Stiffness of left shoulder, not elsewhere classified: Secondary | ICD-10-CM | POA: Diagnosis not present

## 2017-10-20 DIAGNOSIS — M25612 Stiffness of left shoulder, not elsewhere classified: Secondary | ICD-10-CM | POA: Diagnosis not present

## 2017-10-20 DIAGNOSIS — M6281 Muscle weakness (generalized): Secondary | ICD-10-CM | POA: Diagnosis not present

## 2017-10-22 DIAGNOSIS — M25612 Stiffness of left shoulder, not elsewhere classified: Secondary | ICD-10-CM | POA: Diagnosis not present

## 2017-10-22 DIAGNOSIS — M6281 Muscle weakness (generalized): Secondary | ICD-10-CM | POA: Diagnosis not present

## 2017-10-27 DIAGNOSIS — M6281 Muscle weakness (generalized): Secondary | ICD-10-CM | POA: Diagnosis not present

## 2017-10-27 DIAGNOSIS — M25612 Stiffness of left shoulder, not elsewhere classified: Secondary | ICD-10-CM | POA: Diagnosis not present

## 2017-11-03 DIAGNOSIS — M25612 Stiffness of left shoulder, not elsewhere classified: Secondary | ICD-10-CM | POA: Diagnosis not present

## 2017-11-03 DIAGNOSIS — M6281 Muscle weakness (generalized): Secondary | ICD-10-CM | POA: Diagnosis not present

## 2017-11-10 DIAGNOSIS — S143XXD Injury of brachial plexus, subsequent encounter: Secondary | ICD-10-CM | POA: Diagnosis not present

## 2017-11-10 DIAGNOSIS — Z09 Encounter for follow-up examination after completed treatment for conditions other than malignant neoplasm: Secondary | ICD-10-CM | POA: Diagnosis not present

## 2017-11-10 DIAGNOSIS — Z471 Aftercare following joint replacement surgery: Secondary | ICD-10-CM | POA: Diagnosis not present

## 2017-11-10 DIAGNOSIS — S42292A Other displaced fracture of upper end of left humerus, initial encounter for closed fracture: Secondary | ICD-10-CM | POA: Diagnosis not present

## 2017-11-10 DIAGNOSIS — Z96612 Presence of left artificial shoulder joint: Secondary | ICD-10-CM | POA: Diagnosis not present

## 2017-11-10 DIAGNOSIS — Z0000 Encounter for general adult medical examination without abnormal findings: Secondary | ICD-10-CM | POA: Diagnosis not present

## 2017-11-12 DIAGNOSIS — M25612 Stiffness of left shoulder, not elsewhere classified: Secondary | ICD-10-CM | POA: Diagnosis not present

## 2017-11-12 DIAGNOSIS — M6281 Muscle weakness (generalized): Secondary | ICD-10-CM | POA: Diagnosis not present

## 2017-11-17 DIAGNOSIS — M6281 Muscle weakness (generalized): Secondary | ICD-10-CM | POA: Diagnosis not present

## 2017-11-17 DIAGNOSIS — M25612 Stiffness of left shoulder, not elsewhere classified: Secondary | ICD-10-CM | POA: Diagnosis not present

## 2017-11-19 DIAGNOSIS — M79602 Pain in left arm: Secondary | ICD-10-CM | POA: Diagnosis not present

## 2017-11-19 DIAGNOSIS — R202 Paresthesia of skin: Secondary | ICD-10-CM | POA: Diagnosis not present

## 2017-11-19 DIAGNOSIS — S143XXS Injury of brachial plexus, sequela: Secondary | ICD-10-CM | POA: Diagnosis not present

## 2017-11-24 DIAGNOSIS — M6281 Muscle weakness (generalized): Secondary | ICD-10-CM | POA: Diagnosis not present

## 2017-11-24 DIAGNOSIS — M25612 Stiffness of left shoulder, not elsewhere classified: Secondary | ICD-10-CM | POA: Diagnosis not present

## 2017-11-26 DIAGNOSIS — S143XXD Injury of brachial plexus, subsequent encounter: Secondary | ICD-10-CM | POA: Diagnosis not present

## 2017-12-01 DIAGNOSIS — M25612 Stiffness of left shoulder, not elsewhere classified: Secondary | ICD-10-CM | POA: Diagnosis not present

## 2017-12-01 DIAGNOSIS — M6281 Muscle weakness (generalized): Secondary | ICD-10-CM | POA: Diagnosis not present

## 2017-12-03 DIAGNOSIS — M6281 Muscle weakness (generalized): Secondary | ICD-10-CM | POA: Diagnosis not present

## 2017-12-03 DIAGNOSIS — M25612 Stiffness of left shoulder, not elsewhere classified: Secondary | ICD-10-CM | POA: Diagnosis not present

## 2017-12-08 DIAGNOSIS — M6281 Muscle weakness (generalized): Secondary | ICD-10-CM | POA: Diagnosis not present

## 2017-12-08 DIAGNOSIS — M25612 Stiffness of left shoulder, not elsewhere classified: Secondary | ICD-10-CM | POA: Diagnosis not present

## 2017-12-10 DIAGNOSIS — M25612 Stiffness of left shoulder, not elsewhere classified: Secondary | ICD-10-CM | POA: Diagnosis not present

## 2017-12-10 DIAGNOSIS — M6281 Muscle weakness (generalized): Secondary | ICD-10-CM | POA: Diagnosis not present

## 2017-12-17 DIAGNOSIS — M6281 Muscle weakness (generalized): Secondary | ICD-10-CM | POA: Diagnosis not present

## 2017-12-17 DIAGNOSIS — M25612 Stiffness of left shoulder, not elsewhere classified: Secondary | ICD-10-CM | POA: Diagnosis not present

## 2017-12-22 DIAGNOSIS — M25612 Stiffness of left shoulder, not elsewhere classified: Secondary | ICD-10-CM | POA: Diagnosis not present

## 2017-12-22 DIAGNOSIS — M2560 Stiffness of unspecified joint, not elsewhere classified: Secondary | ICD-10-CM | POA: Diagnosis not present

## 2017-12-22 DIAGNOSIS — M6281 Muscle weakness (generalized): Secondary | ICD-10-CM | POA: Diagnosis not present

## 2017-12-27 DIAGNOSIS — K449 Diaphragmatic hernia without obstruction or gangrene: Secondary | ICD-10-CM | POA: Diagnosis not present

## 2017-12-27 DIAGNOSIS — K859 Acute pancreatitis without necrosis or infection, unspecified: Secondary | ICD-10-CM | POA: Diagnosis not present

## 2017-12-27 DIAGNOSIS — Z90710 Acquired absence of both cervix and uterus: Secondary | ICD-10-CM | POA: Diagnosis not present

## 2017-12-27 DIAGNOSIS — R079 Chest pain, unspecified: Secondary | ICD-10-CM | POA: Diagnosis not present

## 2017-12-27 DIAGNOSIS — Z9049 Acquired absence of other specified parts of digestive tract: Secondary | ICD-10-CM | POA: Diagnosis not present

## 2017-12-27 DIAGNOSIS — R945 Abnormal results of liver function studies: Secondary | ICD-10-CM | POA: Diagnosis not present

## 2017-12-27 DIAGNOSIS — Z87891 Personal history of nicotine dependence: Secondary | ICD-10-CM | POA: Diagnosis not present

## 2017-12-27 DIAGNOSIS — I10 Essential (primary) hypertension: Secondary | ICD-10-CM | POA: Diagnosis not present

## 2017-12-27 DIAGNOSIS — F329 Major depressive disorder, single episode, unspecified: Secondary | ICD-10-CM | POA: Diagnosis not present

## 2017-12-27 DIAGNOSIS — G629 Polyneuropathy, unspecified: Secondary | ICD-10-CM | POA: Diagnosis not present

## 2017-12-27 DIAGNOSIS — K858 Other acute pancreatitis without necrosis or infection: Secondary | ICD-10-CM | POA: Diagnosis not present

## 2017-12-27 DIAGNOSIS — B029 Zoster without complications: Secondary | ICD-10-CM | POA: Diagnosis not present

## 2017-12-27 DIAGNOSIS — Z79899 Other long term (current) drug therapy: Secondary | ICD-10-CM | POA: Diagnosis not present

## 2018-01-06 DIAGNOSIS — K903 Pancreatic steatorrhea: Secondary | ICD-10-CM | POA: Diagnosis not present

## 2018-01-06 DIAGNOSIS — I10 Essential (primary) hypertension: Secondary | ICD-10-CM | POA: Diagnosis not present

## 2018-01-06 DIAGNOSIS — K859 Acute pancreatitis without necrosis or infection, unspecified: Secondary | ICD-10-CM | POA: Diagnosis not present

## 2018-01-12 DIAGNOSIS — Z79899 Other long term (current) drug therapy: Secondary | ICD-10-CM | POA: Diagnosis not present

## 2018-01-12 DIAGNOSIS — I10 Essential (primary) hypertension: Secondary | ICD-10-CM | POA: Diagnosis not present

## 2018-01-12 DIAGNOSIS — K903 Pancreatic steatorrhea: Secondary | ICD-10-CM | POA: Diagnosis not present

## 2018-01-27 ENCOUNTER — Encounter (INDEPENDENT_AMBULATORY_CARE_PROVIDER_SITE_OTHER): Payer: Self-pay | Admitting: Internal Medicine

## 2018-01-27 ENCOUNTER — Ambulatory Visit (INDEPENDENT_AMBULATORY_CARE_PROVIDER_SITE_OTHER): Payer: Medicare Other | Admitting: Internal Medicine

## 2018-01-27 ENCOUNTER — Encounter (INDEPENDENT_AMBULATORY_CARE_PROVIDER_SITE_OTHER): Payer: Self-pay | Admitting: *Deleted

## 2018-01-27 VITALS — BP 150/90 | HR 52 | Temp 98.3°F | Ht 64.5 in | Wt 190.6 lb

## 2018-01-27 DIAGNOSIS — R1012 Left upper quadrant pain: Secondary | ICD-10-CM

## 2018-01-27 DIAGNOSIS — K858 Other acute pancreatitis without necrosis or infection: Secondary | ICD-10-CM

## 2018-01-27 NOTE — Patient Instructions (Signed)
Labs and CT.  

## 2018-01-27 NOTE — Progress Notes (Addendum)
   Subjective:    Patient ID: Shelly Maldonado, female    DOB: 01/15/1946, 72 y.o.   MRN: [id number]  HPI Referred by Dr. Olena LeatherwoodHasanaj for elevated liver enzymes. She was [facility] for acute pancreatitis per records. She had pain in her chest and LUQ. She says if she lies on her rt side she has pain. Had had pain for several weeks before admission. She had diarrhea. She thought she had a bug. She was having 5 loose stools a day. When she ate, she would have diarrhea.She says the pain has resolved now unless she is on a Surveyor, mininglawn mower.  Having a BM every 2-3 days which is normal for her. Please note she also had shingles to left lower portion of her chest.    12/27/2017 CT abdomen/pelvis with CM: chest pain. No abnormality of the bowel. No bowel inflammation or obstruction. Dilatation of the intra and extra hepatic bile ducts to the level the ampulla. May well represent benign biliary dilation following cholecystomy.  Mild prominence of the pancreatic duct with differential. CBD 6 mm.  No pancreatic inflammation  01/06/2018 ALP 130, total bili 0.2, AST 24, Lipase 116.Marland Kitchen.    Review of Systems Past Medical History:  Diagnosis Date  . Hypertension   . Pancreatitis   . Shingles       Allergies  Allergen Reactions  . Codeine     unknown  . Morphine And Related     Vein became red    Current Outpatient Medications on File Prior to Visit  Medication Sig Dispense Refill  . bisoprolol-hydrochlorothiazide (ZIAC) 10-6.25 MG tablet Take 1 tablet by mouth daily.    . DULoxetine (CYMBALTA) 60 MG capsule Take 60 mg by mouth daily.    Marland Kitchen. gabapentin (NEURONTIN) 300 MG capsule Take 300 mg by mouth 3 (three) times daily.    Marland Kitchen. HYDROcodone-acetaminophen (NORCO/VICODIN) 5-325 MG tablet Take 1 tablet by mouth every 6 (six) hours as needed for moderate pain.    Marland Kitchen. lipase/protease/amylase (CREON) 12000 units CPEP capsule Take 12,000 Units by mouth. Before meals     No current  facility-administered medications on file prior to visit.         Objective:   Physical Exam Blood pressure (!) 150/90, pulse (!) 52, temperature 98.3 F (36.8 C), height 5' 4.5" (1.638 m), weight 190 lb 9.6 oz (86.5 kg). Alert and oriented. Skin warm and dry. Oral mucosa is moist.   . Sclera anicteric, conjunctivae is pink. Thyroid not enlarged. No cervical lymphadenopathy. Lungs clear. Heart regular rate and rhythm.  Abdomen is soft. Bowel sounds are positive. No hepatomegaly. No abdominal masses felt. No tenderness.  No edema to lower extremities. Patient is alert and oriented.        Assessment & Plan:  ? Pancreatitis. CT did not show. However she did have elevated Lipase and LUQ pain. She had shingles LUQ. Will get a CBC, CMET, Lipase and CT abdomen

## 2018-01-28 LAB — LIPID PANEL
CHOL/HDL RATIO: 3.6 (calc) (ref <5.0)
Cholesterol: 186 mg/dL (ref <200)
HDL: 52 mg/dL (ref >50)
LDL Cholesterol (Calc): 94 mg/dL (calc)
NON-HDL CHOLESTEROL (CALC): 134 mg/dL — AB (ref <130)
Triglycerides: 281 mg/dL — ABNORMAL HIGH (ref <150)

## 2018-01-28 LAB — COMPREHENSIVE METABOLIC PANEL
AG Ratio: 1.4 (calc) (ref 1.0–2.5)
ALBUMIN MSPROF: 4.1 g/dL (ref 3.6–5.1)
ALT: 7 U/L (ref 6–29)
AST: 13 U/L (ref 10–35)
Alkaline phosphatase (APISO): 78 U/L (ref 33–130)
BUN: 15 mg/dL (ref 7–25)
CHLORIDE: 101 mmol/L (ref 98–110)
CO2: 28 mmol/L (ref 20–32)
CREATININE: 0.88 mg/dL (ref 0.60–0.93)
Calcium: 9.8 mg/dL (ref 8.6–10.4)
GLOBULIN: 3 g/dL (ref 1.9–3.7)
Glucose, Bld: 82 mg/dL (ref 65–139)
POTASSIUM: 4.4 mmol/L (ref 3.5–5.3)
SODIUM: 138 mmol/L (ref 135–146)
TOTAL PROTEIN: 7.1 g/dL (ref 6.1–8.1)
Total Bilirubin: 0.2 mg/dL (ref 0.2–1.2)

## 2018-01-28 LAB — CBC WITH DIFFERENTIAL/PLATELET
Basophils Absolute: 96 cells/uL (ref 0–200)
Basophils Relative: 1.3 %
Eosinophils Absolute: 170 cells/uL (ref 15–500)
Eosinophils Relative: 2.3 %
HEMATOCRIT: 40 % (ref 35.0–45.0)
Hemoglobin: 13.4 g/dL (ref 11.7–15.5)
LYMPHS ABS: 1665 {cells}/uL (ref 850–3900)
MCH: 32.3 pg (ref 27.0–33.0)
MCHC: 33.5 g/dL (ref 32.0–36.0)
MCV: 96.4 fL (ref 80.0–100.0)
MPV: 10.2 fL (ref 7.5–12.5)
Monocytes Relative: 11.2 %
NEUTROS PCT: 62.7 %
Neutro Abs: 4640 cells/uL (ref 1500–7800)
PLATELETS: 369 10*3/uL (ref 140–400)
RBC: 4.15 10*6/uL (ref 3.80–5.10)
RDW: 13 % (ref 11.0–15.0)
TOTAL LYMPHOCYTE: 22.5 %
WBC: 7.4 10*3/uL (ref 3.8–10.8)
WBCMIX: 829 {cells}/uL (ref 200–950)

## 2018-01-28 LAB — LIPASE: LIPASE: 64 U/L — AB (ref 7–60)

## 2018-02-04 ENCOUNTER — Ambulatory Visit (HOSPITAL_COMMUNITY)
Admission: RE | Admit: 2018-02-04 | Discharge: 2018-02-04 | Disposition: A | Discharge status: Home / Self Care | Payer: Medicare Other | Source: Ambulatory Visit | Attending: Internal Medicine | Admitting: Internal Medicine

## 2018-02-04 ENCOUNTER — Encounter (HOSPITAL_COMMUNITY): Payer: Self-pay | Admitting: Radiology

## 2018-02-04 DIAGNOSIS — R1012 Left upper quadrant pain: Secondary | ICD-10-CM | POA: Diagnosis not present

## 2018-02-04 DIAGNOSIS — K869 Disease of pancreas, unspecified: Secondary | ICD-10-CM | POA: Diagnosis not present

## 2018-02-04 DIAGNOSIS — K858 Other acute pancreatitis without necrosis or infection: Secondary | ICD-10-CM

## 2018-02-04 DIAGNOSIS — N281 Cyst of kidney, acquired: Secondary | ICD-10-CM | POA: Diagnosis not present

## 2018-02-04 MED ORDER — IOPAMIDOL (ISOVUE-300) INJECTION 61%
100.0000 mL | Freq: Once | INTRAVENOUS | Status: AC | PRN
  Administered 2018-02-04: 100 mL via INTRAVENOUS

## 2018-02-08 DIAGNOSIS — S143XXD Injury of brachial plexus, subsequent encounter: Secondary | ICD-10-CM | POA: Diagnosis not present

## 2018-02-08 DIAGNOSIS — Z96612 Presence of left artificial shoulder joint: Secondary | ICD-10-CM | POA: Diagnosis not present

## 2018-02-08 DIAGNOSIS — Z471 Aftercare following joint replacement surgery: Secondary | ICD-10-CM | POA: Diagnosis not present

## 2018-02-09 ENCOUNTER — Telehealth (INDEPENDENT_AMBULATORY_CARE_PROVIDER_SITE_OTHER): Payer: Self-pay | Admitting: *Deleted

## 2018-02-09 ENCOUNTER — Telehealth (INDEPENDENT_AMBULATORY_CARE_PROVIDER_SITE_OTHER): Payer: Self-pay | Admitting: Internal Medicine

## 2018-02-09 ENCOUNTER — Encounter (INDEPENDENT_AMBULATORY_CARE_PROVIDER_SITE_OTHER): Payer: Self-pay | Admitting: *Deleted

## 2018-02-09 ENCOUNTER — Other Ambulatory Visit (INDEPENDENT_AMBULATORY_CARE_PROVIDER_SITE_OTHER): Payer: Self-pay | Admitting: Internal Medicine

## 2018-02-09 DIAGNOSIS — R935 Abnormal findings on diagnostic imaging of other abdominal regions, including retroperitoneum: Secondary | ICD-10-CM | POA: Insufficient documentation

## 2018-02-09 MED ORDER — SUPREP BOWEL PREP KIT 17.5-3.13-1.6 GM/177ML PO SOLN: 1.0000 | Freq: Once | ORAL | 0 refills | Status: AC

## 2018-02-09 NOTE — Telephone Encounter (Signed)
Patient needs suprep 

## 2018-02-09 NOTE — Telephone Encounter (Signed)
err

## 2018-02-09 NOTE — Telephone Encounter (Signed)
She had a colonoscopy 07/31/2009 by Dr. Linna DarnerAnwar. Screening.  Normal. No colonic or rectal polyps. No colonic neoplasms noted. No diverticulosis.  Scoped was advanced to the cecum with no limitations.

## 2018-02-09 NOTE — Telephone Encounter (Signed)
Ann, colonoscopy. Dx: abnormal CT abdomen.

## 2018-02-09 NOTE — Telephone Encounter (Signed)
TCS sch'd 03/04/18, patient aware, instructions mailed

## 2018-02-12 DIAGNOSIS — M25612 Stiffness of left shoulder, not elsewhere classified: Secondary | ICD-10-CM | POA: Diagnosis not present

## 2018-02-12 DIAGNOSIS — M25632 Stiffness of left wrist, not elsewhere classified: Secondary | ICD-10-CM | POA: Diagnosis not present

## 2018-02-12 DIAGNOSIS — M6281 Muscle weakness (generalized): Secondary | ICD-10-CM | POA: Diagnosis not present

## 2018-02-12 DIAGNOSIS — M25642 Stiffness of left hand, not elsewhere classified: Secondary | ICD-10-CM | POA: Diagnosis not present

## 2018-02-15 DIAGNOSIS — M25642 Stiffness of left hand, not elsewhere classified: Secondary | ICD-10-CM | POA: Diagnosis not present

## 2018-02-15 DIAGNOSIS — M25612 Stiffness of left shoulder, not elsewhere classified: Secondary | ICD-10-CM | POA: Diagnosis not present

## 2018-02-15 DIAGNOSIS — M6281 Muscle weakness (generalized): Secondary | ICD-10-CM | POA: Diagnosis not present

## 2018-02-15 DIAGNOSIS — M25632 Stiffness of left wrist, not elsewhere classified: Secondary | ICD-10-CM | POA: Diagnosis not present

## 2018-02-19 DIAGNOSIS — M25642 Stiffness of left hand, not elsewhere classified: Secondary | ICD-10-CM | POA: Diagnosis not present

## 2018-02-19 DIAGNOSIS — M6281 Muscle weakness (generalized): Secondary | ICD-10-CM | POA: Diagnosis not present

## 2018-02-19 DIAGNOSIS — M25632 Stiffness of left wrist, not elsewhere classified: Secondary | ICD-10-CM | POA: Diagnosis not present

## 2018-02-19 DIAGNOSIS — M25612 Stiffness of left shoulder, not elsewhere classified: Secondary | ICD-10-CM | POA: Diagnosis not present

## 2018-02-23 DIAGNOSIS — M25612 Stiffness of left shoulder, not elsewhere classified: Secondary | ICD-10-CM | POA: Diagnosis not present

## 2018-02-23 DIAGNOSIS — M25632 Stiffness of left wrist, not elsewhere classified: Secondary | ICD-10-CM | POA: Diagnosis not present

## 2018-02-23 DIAGNOSIS — M6281 Muscle weakness (generalized): Secondary | ICD-10-CM | POA: Diagnosis not present

## 2018-02-23 DIAGNOSIS — M25642 Stiffness of left hand, not elsewhere classified: Secondary | ICD-10-CM | POA: Diagnosis not present

## 2018-02-26 DIAGNOSIS — M25612 Stiffness of left shoulder, not elsewhere classified: Secondary | ICD-10-CM | POA: Diagnosis not present

## 2018-02-26 DIAGNOSIS — M6281 Muscle weakness (generalized): Secondary | ICD-10-CM | POA: Diagnosis not present

## 2018-02-26 DIAGNOSIS — M25632 Stiffness of left wrist, not elsewhere classified: Secondary | ICD-10-CM | POA: Diagnosis not present

## 2018-02-26 DIAGNOSIS — M25642 Stiffness of left hand, not elsewhere classified: Secondary | ICD-10-CM | POA: Diagnosis not present

## 2018-03-01 DIAGNOSIS — M25632 Stiffness of left wrist, not elsewhere classified: Secondary | ICD-10-CM | POA: Diagnosis not present

## 2018-03-01 DIAGNOSIS — M25642 Stiffness of left hand, not elsewhere classified: Secondary | ICD-10-CM | POA: Diagnosis not present

## 2018-03-01 DIAGNOSIS — M6281 Muscle weakness (generalized): Secondary | ICD-10-CM | POA: Diagnosis not present

## 2018-03-01 DIAGNOSIS — M25612 Stiffness of left shoulder, not elsewhere classified: Secondary | ICD-10-CM | POA: Diagnosis not present

## 2018-03-04 ENCOUNTER — Other Ambulatory Visit: Payer: Self-pay

## 2018-03-04 ENCOUNTER — Encounter (HOSPITAL_COMMUNITY)
Admission: RE | Disposition: A | Discharge status: Home / Self Care | Payer: Self-pay | Source: Ambulatory Visit | Attending: Internal Medicine

## 2018-03-04 ENCOUNTER — Encounter (HOSPITAL_COMMUNITY): Payer: Self-pay | Admitting: *Deleted

## 2018-03-04 ENCOUNTER — Ambulatory Visit (HOSPITAL_COMMUNITY)
Admission: RE | Admit: 2018-03-04 | Discharge: 2018-03-04 | Disposition: A | Discharge status: Home / Self Care | Payer: Medicare Other | Source: Ambulatory Visit | Attending: Internal Medicine | Admitting: Internal Medicine

## 2018-03-04 DIAGNOSIS — Z87891 Personal history of nicotine dependence: Secondary | ICD-10-CM | POA: Diagnosis not present

## 2018-03-04 DIAGNOSIS — R109 Unspecified abdominal pain: Secondary | ICD-10-CM | POA: Diagnosis not present

## 2018-03-04 DIAGNOSIS — R935 Abnormal findings on diagnostic imaging of other abdominal regions, including retroperitoneum: Secondary | ICD-10-CM | POA: Diagnosis not present

## 2018-03-04 DIAGNOSIS — I10 Essential (primary) hypertension: Secondary | ICD-10-CM | POA: Insufficient documentation

## 2018-03-04 DIAGNOSIS — Z791 Long term (current) use of non-steroidal anti-inflammatories (NSAID): Secondary | ICD-10-CM | POA: Diagnosis not present

## 2018-03-04 DIAGNOSIS — Z9049 Acquired absence of other specified parts of digestive tract: Secondary | ICD-10-CM | POA: Insufficient documentation

## 2018-03-04 DIAGNOSIS — R933 Abnormal findings on diagnostic imaging of other parts of digestive tract: Secondary | ICD-10-CM | POA: Diagnosis not present

## 2018-03-04 DIAGNOSIS — K644 Residual hemorrhoidal skin tags: Secondary | ICD-10-CM | POA: Diagnosis not present

## 2018-03-04 DIAGNOSIS — Z79899 Other long term (current) drug therapy: Secondary | ICD-10-CM | POA: Insufficient documentation

## 2018-03-04 DIAGNOSIS — K5730 Diverticulosis of large intestine without perforation or abscess without bleeding: Secondary | ICD-10-CM | POA: Diagnosis not present

## 2018-03-04 DIAGNOSIS — Z8719 Personal history of other diseases of the digestive system: Secondary | ICD-10-CM | POA: Diagnosis not present

## 2018-03-04 HISTORY — PX: COLONOSCOPY: SHX5424

## 2018-03-04 SURGERY — COLONOSCOPY
Anesthesia: Moderate Sedation

## 2018-03-04 MED ORDER — MEPERIDINE HCL 50 MG/ML IJ SOLN
INTRAMUSCULAR | Status: DC | PRN
  Administered 2018-03-04 (×2): 25 mg via INTRAVENOUS

## 2018-03-04 MED ORDER — MIDAZOLAM HCL 5 MG/5ML IJ SOLN
INTRAMUSCULAR | Status: AC
  Filled 2018-03-04: qty 10

## 2018-03-04 MED ORDER — STERILE WATER FOR IRRIGATION IR SOLN
Status: DC | PRN
  Administered 2018-03-04: 100 mL

## 2018-03-04 MED ORDER — SODIUM CHLORIDE 0.9 % IV SOLN
INTRAVENOUS | Status: DC
  Administered 2018-03-04: 13:00:00 via INTRAVENOUS

## 2018-03-04 MED ORDER — MEPERIDINE HCL 50 MG/ML IJ SOLN
INTRAMUSCULAR | Status: AC
  Filled 2018-03-04: qty 1

## 2018-03-04 MED ORDER — MIDAZOLAM HCL 5 MG/5ML IJ SOLN
INTRAMUSCULAR | Status: DC | PRN
  Administered 2018-03-04 (×3): 2 mg via INTRAVENOUS

## 2018-03-04 NOTE — Discharge Instructions (Signed)
Resume usual medications as before. High-fiber diet. No driving for 24 hours. Call if right lower quadrant abdominal pain becomes more frequent or intense.       Colonoscopy, Adult, Care After This sheet gives you information about how to care for yourself after your procedure. Your doctor may also give you more specific instructions. If you have problems or questions, call your doctor. Follow these instructions at home: General instructions   For the first 24 hours after the procedure: ? Do not drive or use machinery. ? Do not sign important documents. ? Do not drink alcohol. ? Do your daily activities more slowly than normal. ? Eat foods that are soft and easy to digest. ? Rest often.  Take over-the-counter or prescription medicines only as told by your doctor.  It is up to you to get the results of your procedure. Ask your doctor, or the department performing the procedure, when your results will be ready. To help cramping and bloating:  Try walking around.  Put heat on your belly (abdomen) as told by your doctor. Use a heat source that your doctor recommends, such as a moist heat pack or a heating pad. ? Put a towel between your skin and the heat source. ? Leave the heat on for 20-30 minutes. ? Remove the heat if your skin turns bright red. This is especially important if you cannot feel pain, heat, or cold. You can get burned. Eating and drinking  Drink enough fluid to keep your pee (urine) clear or pale yellow.  Return to your normal diet as told by your doctor. Avoid heavy or fried foods that are hard to digest.  Avoid drinking alcohol for as long as told by your doctor. Contact a doctor if:  You have blood in your poop (stool) 2-3 days after the procedure. Get help right away if:  You have more than a small amount of blood in your poop.  You see large clumps of tissue (blood clots) in your poop.  Your belly is swollen.  You feel sick to your stomach  (nauseous).  You throw up (vomit).  You have a fever.  You have belly pain that gets worse, and medicine does not help your pain. This information is not intended to replace advice given to you by your health care provider. Make sure you discuss any questions you have with your health care provider. Document Released: 08/02/2010 Document Revised: 03/24/2016 Document Reviewed: 03/24/2016 Elsevier Interactive Patient Education  2017 Elsevier Inc.     High-Fiber Diet Fiber, also called dietary fiber, is a type of carbohydrate found in fruits, vegetables, whole grains, and beans. A high-fiber diet can have many health benefits. Your health care provider may recommend a high-fiber diet to help:  Prevent constipation. Fiber can make your bowel movements more regular.  Lower your cholesterol.  Relieve hemorrhoids, uncomplicated diverticulosis, or irritable bowel syndrome.  Prevent overeating as part of a weight-loss plan.  Prevent heart disease, type 2 diabetes, and certain cancers.  What is my plan? The recommended daily intake of fiber includes:  38 grams for men under age 71.  30 grams for men over age 71.  25 grams for women under age 71.  21 grams for women over age 71.  You can get the recommended daily intake of dietary fiber by eating a variety of fruits, vegetables, grains, and beans. Your health care provider may also recommend a fiber supplement if it is not possible to get enough fiber through your  diet. What do I need to know about a high-fiber diet?  Fiber supplements have not been widely studied for their effectiveness, so it is better to get fiber through food sources.  Always check the fiber content on thenutrition facts label of any prepackaged food. Look for foods that contain at least 5 grams of fiber per serving.  Ask your dietitian if you have questions about specific foods that are related to your condition, especially if those foods are not listed in  the following section.  Increase your daily fiber consumption gradually. Increasing your intake of dietary fiber too quickly may cause bloating, cramping, or gas.  Drink plenty of water. Water helps you to digest fiber. What foods can I eat? Grains Whole-grain breads. Multigrain cereal. Oats and oatmeal. Brown rice. Barley. Bulgur wheat. Millet. Bran muffins. Popcorn. Rye wafer crackers. Vegetables Sweet potatoes. Spinach. Kale. Artichokes. Cabbage. Broccoli. Green peas. Carrots. Squash. Fruits Berries. Pears. Apples. Oranges. Avocados. Prunes and raisins. Dried figs. Meats and Other Protein Sources Navy, kidney, pinto, and soy beans. Split peas. Lentils. Nuts and seeds. Dairy Fiber-fortified yogurt. Beverages Fiber-fortified soy milk. Fiber-fortified orange juice. Other Fiber bars. The items listed above may not be a complete list of recommended foods or beverages. Contact your dietitian for more options. What foods are not recommended? Grains White bread. Pasta made with refined flour. White rice. Vegetables Fried potatoes. Canned vegetables. Well-cooked vegetables. Fruits Fruit juice. Cooked, strained fruit. Meats and Other Protein Sources Fatty cuts of meat. Fried Environmental education officer or fried fish. Dairy Milk. Yogurt. Cream cheese. Sour cream. Beverages Soft drinks. Other Cakes and pastries. Butter and oils. The items listed above may not be a complete list of foods and beverages to avoid. Contact your dietitian for more information. What are some tips for including high-fiber foods in my diet?  Eat a wide variety of high-fiber foods.  Make sure that half of all grains consumed each day are whole grains.  Replace breads and cereals made from refined flour or white flour with whole-grain breads and cereals.  Replace white rice with brown rice, bulgur wheat, or millet.  Start the day with a breakfast that is high in fiber, such as a cereal that contains at least 5 grams of fiber  per serving.  Use beans in place of meat in soups, salads, or pasta.  Eat high-fiber snacks, such as berries, raw vegetables, nuts, or popcorn. This information is not intended to replace advice given to you by your health care provider. Make sure you discuss any questions you have with your health care provider. Document Released: 06/30/2005 Document Revised: 12/06/2015 Document Reviewed: 12/13/2013 Elsevier Interactive Patient Education  2018 [employer].   Diverticulosis Diverticulosis is a condition that develops when small pouches (diverticula) form in the wall of the large intestine (colon). The colon is where water is absorbed and stool is formed. The pouches form when the inside layer of the colon pushes through weak spots in the outer layers of the colon. You may have a few pouches or many of them. What are the causes? The cause of this condition is not known. What increases the risk? The following factors may make you more likely to develop this condition:  Being older than age 61. Your risk for this condition increases with age. Diverticulosis is rare among people younger than age 60. By age 11, many people have it.  Eating a low-fiber diet.  Having frequent constipation.  Being overweight.  Not getting enough exercise.  Smoking.  Taking over-the-counter pain medicines, like aspirin and ibuprofen.  Having a family history of diverticulosis.  What are the signs or symptoms? In most people, there are no symptoms of this condition. If you do have symptoms, they may include:  Bloating.  Cramps in the abdomen.  Constipation or diarrhea.  Pain in the lower left side of the abdomen.  How is this diagnosed? This condition is most often diagnosed during an exam for other colon problems. Because diverticulosis usually has no symptoms, it often cannot be diagnosed independently. This condition may be diagnosed by:  Using a flexible scope to examine the colon  (colonoscopy).  Taking an X-ray of the colon after dye has been put into the colon (barium enema).  Doing a CT scan.  How is this treated? You may not need treatment for this condition if you have never developed an infection related to diverticulosis. If you have had an infection before, treatment may include:  Eating a high-fiber diet. This may include eating more fruits, vegetables, and grains.  Taking a fiber supplement.  Taking a live bacteria supplement (probiotic).  Taking medicine to relax your colon.  Taking antibiotic medicines.  Follow these instructions at home:  Drink 6-8 glasses of water or more each day to prevent constipation.  Try not to strain when you have a bowel movement.  If you have had an infection before: ? Eat more fiber as directed by your health care provider or your diet and nutrition specialist (dietitian). ? Take a fiber supplement or probiotic, if your health care provider approves.  Take over-the-counter and prescription medicines only as told by your health care provider.  If you were prescribed an antibiotic, take it as told by your health care provider. Do not stop taking the antibiotic even if you start to feel better.  Keep all follow-up visits as told by your health care provider. This is important. Contact a health care provider if:  You have pain in your abdomen.  You have bloating.  You have cramps.  You have not had a bowel movement in 3 days. Get help right away if:  Your pain gets worse.  Your bloating becomes very bad.  You have a fever or chills, and your symptoms suddenly get worse.  You vomit.  You have bowel movements that are bloody or black.  You have bleeding from your rectum. Summary  Diverticulosis is a condition that develops when small pouches (diverticula) form in the wall of the large intestine (colon).  You may have a few pouches or many of them.  This condition is most often diagnosed during an  exam for other colon problems.  If you have had an infection related to diverticulosis, treatment may include increasing the fiber in your diet, taking supplements, or taking medicines. This information is not intended to replace advice given to you by your health care provider. Make sure you discuss any questions you have with your health care provider. Document Released: 03/27/2004 Document Revised: 05/19/2016 Document Reviewed: 05/19/2016 Elsevier Interactive Patient Education  2017 [employer].

## 2018-03-04 NOTE — Op Note (Signed)
[facility] Patient Name: Shelly SavoyFrances Maldonado Procedure Date: 03/04/2018 1:00 PM MRN: [id number] Date of Birth: 09/01/1945 Attending MD: Lionel DecemberNajeeb Rehman , MD CSN: [id number] Age: [age] Admit Type: Outpatient Procedure:                Colonoscopy Indications:              Abnormal CT of the GI tract; cecal wall thickening. Providers:                Lionel DecemberNajeeb Rehman, MD, Nena PolioLisa Moore, RN, Dyann Ruddleonya Wilson Referring MD:             Lia HoppingXaje Hasanaj, MD Medicines:                Meperidine 50 mg IV, Midazolam 6 mg IV Complications:            No immediate complications. Estimated Blood Loss:     Estimated blood loss: none. Procedure:                Pre-Anesthesia Assessment:                           - Prior to the procedure, a History and Physical                            was performed, and patient medications and                            allergies were reviewed. The patient's tolerance of                            previous anesthesia was also reviewed. The risks                            and benefits of the procedure and the sedation                            options and risks were discussed with the patient.                            All questions were answered, and informed consent                            was obtained. Prior Anticoagulants: The patient                            last took ibuprofen 2 days prior to the procedure.                            ASA Grade Assessment: II - A patient with mild                            systemic disease. After reviewing the risks and                            benefits, the patient was deemed in satisfactory  condition to undergo the procedure.                           After obtaining informed consent, the colonoscope                            was passed under direct vision. Throughout the                            procedure, the patient's blood pressure, pulse, and                            oxygen saturations were  monitored continuously. The                            [id number]-[id number] ([id number]) scope was introduced through                            the anus and advanced to the the cecum, identified                            by appendiceal orifice and ileocecal valve. The                            colonoscopy was performed without difficulty. The                            patient tolerated the procedure well. The quality                            of the bowel preparation was good. The ileocecal                            valve, appendiceal orifice, and rectum were                            photographed. Scope In: 1:22:31 PM Scope Out: 1:47:38 PM Scope Withdrawal Time: 0 hours 13 minutes 7 seconds  Total Procedure Duration: 0 hours 25 minutes 7 seconds  Findings:      The perianal and digital rectal examinations were normal.      A few medium-mouthed diverticula were found in the sigmoid colon.      The exam was otherwise normal throughout the examined colon.      External hemorrhoids were found during retroflexion. The hemorrhoids       were small. Impression:               - Diverticulosis in the sigmoid colon.                           - External hemorrhoids.                           - No specimens collected. Moderate Sedation:      Moderate (conscious) sedation was administered by the endoscopy nurse       and  supervised by the endoscopist. The following parameters were       monitored: oxygen saturation, heart rate, blood pressure, CO2       capnography and response to care. Total physician intraservice time was       30 minutes. Recommendation:           - Patient has a contact number available for                            emergencies. The signs and symptoms of potential                            delayed complications were discussed with the                            patient. Return to normal activities tomorrow.                            Written discharge instructions were provided  to the                            patient.                           - High fiber diet today.                           - Continue present medications.                           - No repeat colonoscopy due to age and the absence                            of advanced adenomas. Procedure Code(s):        --- Professional ---                           [phone], Colonoscopy, flexible; diagnostic, including                            collection of specimen(s) by brushing or washing,                            when performed (separate procedure)                           G0500, Moderate sedation services provided by the                            same physician or other qualified health care                            professional performing a gastrointestinal                            endoscopic service that sedation supports,  requiring the presence of an independent trained                            observer to assist in the monitoring of the                            patient's level of consciousness and physiological                            status; initial 15 minutes of intra-service time;                            patient age 84 years or older (additional time may                            be reported with 16109, as appropriate)                           [phone], Moderate sedation services provided by the                            same physician or other qualified health care                            professional performing the diagnostic or                            therapeutic service that the sedation supports,                            requiring the presence of an independent trained                            observer to assist in the monitoring of the                            patient's level of consciousness and physiological                            status; each additional 15 minutes intraservice                            time (List separately  in addition to code for                            primary service) Diagnosis Code(s):        --- Professional ---                           K64.4, Residual hemorrhoidal skin tags                           K57.30, Diverticulosis of large intestine without  perforation or abscess without bleeding                           R93.3, Abnormal findings on diagnostic imaging of                            other parts of digestive tract CPT copyright 2017 American Medical Association. All rights reserved. The codes documented in this report are preliminary and upon coder review may  be revised to meet current compliance requirements. Lionel December, MD Lionel December, MD 03/04/2018 1:54:52 PM This report has been signed electronically. Number of Addenda: 0

## 2018-03-04 NOTE — H&P (Signed)
Pollyann SavoyFrances Fissel is an 72 y.o. female.   Chief Complaint: Patient is here for colonoscopy. HPI: Patient is 72 year old Caucasian female whose last colonoscopy was in 2011 who underwent abdominal pelvic CT for abdominal pain.  She was noted to have cecal wall thickening.  Pain comes and goes.  She denies melena or rectal bleeding. Family history is negative for CRC.  Past Medical History:  Diagnosis Date  . Hypertension   . Pancreatitis   . Shingles     Past Surgical History:  Procedure Laterality Date  . CHOLECYSTECTOMY    . complete hysterecomy      Family History  Problem Relation Age of Onset  . Colon cancer Neg Hx    Social History:  reports that she has quit smoking. She has never used smokeless tobacco. She reports that she does not drink alcohol or use drugs.  Allergies:  Allergies  Allergen Reactions  . Codeine Other (See Comments)    unknown  . Morphine And Related     Vein became red    Medications Prior to Admission  Medication Sig Dispense Refill  . bisoprolol-hydrochlorothiazide (ZIAC) 10-6.25 MG tablet Take 1 tablet by mouth daily.    . cephALEXin (KEFLEX) 500 MG capsule Take 500 mg by mouth 2 (two) times daily.  0  . DULoxetine (CYMBALTA) 60 MG capsule Take 60 mg by mouth daily.    Marland Kitchen. gabapentin (NEURONTIN) 300 MG capsule Take 300 mg by mouth 3 (three) times daily.    Marland Kitchen. ibuprofen (ADVIL,MOTRIN) 200 MG tablet Take 200 mg by mouth every 6 (six) hours as needed for moderate pain.      No results found for this or any previous visit (from the past 48 hour(s)). No results found.  ROS  Blood pressure (!) 144/65, pulse (!) 56, temperature 98.7 F (37.1 C), temperature source Oral, resp. rate 17, height 5' 4.5" (1.638 m), weight 87.5 kg, SpO2 99 %. Physical Exam  Constitutional: She appears well-developed and well-nourished.  HENT:  Mouth/Throat: Oropharynx is clear and moist.  Bony exostosis at hard palate  Eyes: Conjunctivae are normal. No scleral icterus.   Neck: No thyromegaly present.  Cardiovascular: Normal rate, regular rhythm and normal heart sounds.  No murmur heard. Respiratory: Effort normal and breath sounds normal.  GI: Soft. She exhibits no distension and no mass. There is no tenderness.  Musculoskeletal: She exhibits no edema.  Neurological: She is alert.  Skin: Skin is warm and dry.     Assessment/Plan Cecal wall thickening on abdominal pelvic CT. Diagnostic colonoscopy.  Lionel DecemberNajeeb Grayson Pfefferle, MD 03/04/2018, 1:13 PM

## 2018-03-08 DIAGNOSIS — M25612 Stiffness of left shoulder, not elsewhere classified: Secondary | ICD-10-CM | POA: Diagnosis not present

## 2018-03-08 DIAGNOSIS — M25632 Stiffness of left wrist, not elsewhere classified: Secondary | ICD-10-CM | POA: Diagnosis not present

## 2018-03-08 DIAGNOSIS — M6281 Muscle weakness (generalized): Secondary | ICD-10-CM | POA: Diagnosis not present

## 2018-03-08 DIAGNOSIS — M25642 Stiffness of left hand, not elsewhere classified: Secondary | ICD-10-CM | POA: Diagnosis not present

## 2018-03-09 ENCOUNTER — Encounter (HOSPITAL_COMMUNITY): Payer: Self-pay | Admitting: Internal Medicine

## 2018-03-16 DIAGNOSIS — M25632 Stiffness of left wrist, not elsewhere classified: Secondary | ICD-10-CM | POA: Diagnosis not present

## 2018-03-16 DIAGNOSIS — M6281 Muscle weakness (generalized): Secondary | ICD-10-CM | POA: Diagnosis not present

## 2018-03-16 DIAGNOSIS — M25642 Stiffness of left hand, not elsewhere classified: Secondary | ICD-10-CM | POA: Diagnosis not present

## 2018-03-16 DIAGNOSIS — M25612 Stiffness of left shoulder, not elsewhere classified: Secondary | ICD-10-CM | POA: Diagnosis not present

## 2018-03-19 DIAGNOSIS — Z4889 Encounter for other specified surgical aftercare: Secondary | ICD-10-CM | POA: Diagnosis not present

## 2018-03-19 DIAGNOSIS — Z96612 Presence of left artificial shoulder joint: Secondary | ICD-10-CM | POA: Diagnosis not present

## 2018-03-22 DIAGNOSIS — M25612 Stiffness of left shoulder, not elsewhere classified: Secondary | ICD-10-CM | POA: Diagnosis not present

## 2018-03-22 DIAGNOSIS — M25642 Stiffness of left hand, not elsewhere classified: Secondary | ICD-10-CM | POA: Diagnosis not present

## 2018-03-22 DIAGNOSIS — M6281 Muscle weakness (generalized): Secondary | ICD-10-CM | POA: Diagnosis not present

## 2018-03-22 DIAGNOSIS — M25632 Stiffness of left wrist, not elsewhere classified: Secondary | ICD-10-CM | POA: Diagnosis not present

## 2018-03-25 DIAGNOSIS — M25632 Stiffness of left wrist, not elsewhere classified: Secondary | ICD-10-CM | POA: Diagnosis not present

## 2018-03-25 DIAGNOSIS — M25642 Stiffness of left hand, not elsewhere classified: Secondary | ICD-10-CM | POA: Diagnosis not present

## 2018-03-25 DIAGNOSIS — M6281 Muscle weakness (generalized): Secondary | ICD-10-CM | POA: Diagnosis not present

## 2018-03-25 DIAGNOSIS — M25612 Stiffness of left shoulder, not elsewhere classified: Secondary | ICD-10-CM | POA: Diagnosis not present

## 2018-03-29 DIAGNOSIS — M25632 Stiffness of left wrist, not elsewhere classified: Secondary | ICD-10-CM | POA: Diagnosis not present

## 2018-03-29 DIAGNOSIS — M25642 Stiffness of left hand, not elsewhere classified: Secondary | ICD-10-CM | POA: Diagnosis not present

## 2018-03-29 DIAGNOSIS — M6281 Muscle weakness (generalized): Secondary | ICD-10-CM | POA: Diagnosis not present

## 2018-03-29 DIAGNOSIS — M25612 Stiffness of left shoulder, not elsewhere classified: Secondary | ICD-10-CM | POA: Diagnosis not present

## 2018-04-01 DIAGNOSIS — M25642 Stiffness of left hand, not elsewhere classified: Secondary | ICD-10-CM | POA: Diagnosis not present

## 2018-04-01 DIAGNOSIS — M6281 Muscle weakness (generalized): Secondary | ICD-10-CM | POA: Diagnosis not present

## 2018-04-01 DIAGNOSIS — M25632 Stiffness of left wrist, not elsewhere classified: Secondary | ICD-10-CM | POA: Diagnosis not present

## 2018-04-01 DIAGNOSIS — M25612 Stiffness of left shoulder, not elsewhere classified: Secondary | ICD-10-CM | POA: Diagnosis not present

## 2018-04-05 DIAGNOSIS — M25612 Stiffness of left shoulder, not elsewhere classified: Secondary | ICD-10-CM | POA: Diagnosis not present

## 2018-04-05 DIAGNOSIS — M25632 Stiffness of left wrist, not elsewhere classified: Secondary | ICD-10-CM | POA: Diagnosis not present

## 2018-04-05 DIAGNOSIS — M25642 Stiffness of left hand, not elsewhere classified: Secondary | ICD-10-CM | POA: Diagnosis not present

## 2018-04-05 DIAGNOSIS — M6281 Muscle weakness (generalized): Secondary | ICD-10-CM | POA: Diagnosis not present

## 2018-04-12 DIAGNOSIS — M6281 Muscle weakness (generalized): Secondary | ICD-10-CM | POA: Diagnosis not present

## 2018-04-12 DIAGNOSIS — M25632 Stiffness of left wrist, not elsewhere classified: Secondary | ICD-10-CM | POA: Diagnosis not present

## 2018-04-12 DIAGNOSIS — M25642 Stiffness of left hand, not elsewhere classified: Secondary | ICD-10-CM | POA: Diagnosis not present

## 2018-04-12 DIAGNOSIS — M25612 Stiffness of left shoulder, not elsewhere classified: Secondary | ICD-10-CM | POA: Diagnosis not present

## 2018-04-15 DIAGNOSIS — M25632 Stiffness of left wrist, not elsewhere classified: Secondary | ICD-10-CM | POA: Diagnosis not present

## 2018-04-15 DIAGNOSIS — M25612 Stiffness of left shoulder, not elsewhere classified: Secondary | ICD-10-CM | POA: Diagnosis not present

## 2018-04-15 DIAGNOSIS — M25642 Stiffness of left hand, not elsewhere classified: Secondary | ICD-10-CM | POA: Diagnosis not present

## 2018-04-15 DIAGNOSIS — M6281 Muscle weakness (generalized): Secondary | ICD-10-CM | POA: Diagnosis not present

## 2018-04-19 DIAGNOSIS — M25612 Stiffness of left shoulder, not elsewhere classified: Secondary | ICD-10-CM | POA: Diagnosis not present

## 2018-04-19 DIAGNOSIS — M25642 Stiffness of left hand, not elsewhere classified: Secondary | ICD-10-CM | POA: Diagnosis not present

## 2018-04-19 DIAGNOSIS — M6281 Muscle weakness (generalized): Secondary | ICD-10-CM | POA: Diagnosis not present

## 2018-04-19 DIAGNOSIS — M25632 Stiffness of left wrist, not elsewhere classified: Secondary | ICD-10-CM | POA: Diagnosis not present

## 2018-04-22 DIAGNOSIS — M25642 Stiffness of left hand, not elsewhere classified: Secondary | ICD-10-CM | POA: Diagnosis not present

## 2018-04-22 DIAGNOSIS — M25612 Stiffness of left shoulder, not elsewhere classified: Secondary | ICD-10-CM | POA: Diagnosis not present

## 2018-04-22 DIAGNOSIS — M6281 Muscle weakness (generalized): Secondary | ICD-10-CM | POA: Diagnosis not present

## 2018-04-22 DIAGNOSIS — M25632 Stiffness of left wrist, not elsewhere classified: Secondary | ICD-10-CM | POA: Diagnosis not present

## 2018-04-29 DIAGNOSIS — M25632 Stiffness of left wrist, not elsewhere classified: Secondary | ICD-10-CM | POA: Diagnosis not present

## 2018-04-29 DIAGNOSIS — M25642 Stiffness of left hand, not elsewhere classified: Secondary | ICD-10-CM | POA: Diagnosis not present

## 2018-04-29 DIAGNOSIS — M6281 Muscle weakness (generalized): Secondary | ICD-10-CM | POA: Diagnosis not present

## 2018-04-29 DIAGNOSIS — M25612 Stiffness of left shoulder, not elsewhere classified: Secondary | ICD-10-CM | POA: Diagnosis not present

## 2018-05-04 DIAGNOSIS — M25612 Stiffness of left shoulder, not elsewhere classified: Secondary | ICD-10-CM | POA: Diagnosis not present

## 2018-05-04 DIAGNOSIS — M25642 Stiffness of left hand, not elsewhere classified: Secondary | ICD-10-CM | POA: Diagnosis not present

## 2018-05-04 DIAGNOSIS — M6281 Muscle weakness (generalized): Secondary | ICD-10-CM | POA: Diagnosis not present

## 2018-05-04 DIAGNOSIS — M25632 Stiffness of left wrist, not elsewhere classified: Secondary | ICD-10-CM | POA: Diagnosis not present

## 2018-05-06 DIAGNOSIS — M6281 Muscle weakness (generalized): Secondary | ICD-10-CM | POA: Diagnosis not present

## 2018-05-06 DIAGNOSIS — M25642 Stiffness of left hand, not elsewhere classified: Secondary | ICD-10-CM | POA: Diagnosis not present

## 2018-05-06 DIAGNOSIS — M25612 Stiffness of left shoulder, not elsewhere classified: Secondary | ICD-10-CM | POA: Diagnosis not present

## 2018-05-06 DIAGNOSIS — M25632 Stiffness of left wrist, not elsewhere classified: Secondary | ICD-10-CM | POA: Diagnosis not present

## 2018-05-12 DIAGNOSIS — M25632 Stiffness of left wrist, not elsewhere classified: Secondary | ICD-10-CM | POA: Diagnosis not present

## 2018-05-12 DIAGNOSIS — M25642 Stiffness of left hand, not elsewhere classified: Secondary | ICD-10-CM | POA: Diagnosis not present

## 2018-05-12 DIAGNOSIS — M6281 Muscle weakness (generalized): Secondary | ICD-10-CM | POA: Diagnosis not present

## 2018-05-12 DIAGNOSIS — M25612 Stiffness of left shoulder, not elsewhere classified: Secondary | ICD-10-CM | POA: Diagnosis not present

## 2018-05-21 DIAGNOSIS — Z4889 Encounter for other specified surgical aftercare: Secondary | ICD-10-CM | POA: Diagnosis not present

## 2018-05-21 DIAGNOSIS — Z96612 Presence of left artificial shoulder joint: Secondary | ICD-10-CM | POA: Diagnosis not present

## 2018-05-21 DIAGNOSIS — Z471 Aftercare following joint replacement surgery: Secondary | ICD-10-CM | POA: Diagnosis not present

## 2018-05-21 DIAGNOSIS — Z0000 Encounter for general adult medical examination without abnormal findings: Secondary | ICD-10-CM | POA: Diagnosis not present

## 2018-05-24 DIAGNOSIS — I10 Essential (primary) hypertension: Secondary | ICD-10-CM | POA: Diagnosis not present

## 2018-05-24 DIAGNOSIS — G588 Other specified mononeuropathies: Secondary | ICD-10-CM | POA: Diagnosis not present

## 2018-06-03 DIAGNOSIS — M6281 Muscle weakness (generalized): Secondary | ICD-10-CM | POA: Diagnosis not present

## 2018-06-03 DIAGNOSIS — M25632 Stiffness of left wrist, not elsewhere classified: Secondary | ICD-10-CM | POA: Diagnosis not present

## 2018-06-03 DIAGNOSIS — M25642 Stiffness of left hand, not elsewhere classified: Secondary | ICD-10-CM | POA: Diagnosis not present

## 2018-06-03 DIAGNOSIS — Z23 Encounter for immunization: Secondary | ICD-10-CM | POA: Diagnosis not present

## 2018-06-03 DIAGNOSIS — M25612 Stiffness of left shoulder, not elsewhere classified: Secondary | ICD-10-CM | POA: Diagnosis not present

## 2018-06-08 DIAGNOSIS — M25642 Stiffness of left hand, not elsewhere classified: Secondary | ICD-10-CM | POA: Diagnosis not present

## 2018-06-08 DIAGNOSIS — M25632 Stiffness of left wrist, not elsewhere classified: Secondary | ICD-10-CM | POA: Diagnosis not present

## 2018-06-08 DIAGNOSIS — M6281 Muscle weakness (generalized): Secondary | ICD-10-CM | POA: Diagnosis not present

## 2018-06-08 DIAGNOSIS — M25612 Stiffness of left shoulder, not elsewhere classified: Secondary | ICD-10-CM | POA: Diagnosis not present

## 2018-06-11 DIAGNOSIS — M6281 Muscle weakness (generalized): Secondary | ICD-10-CM | POA: Diagnosis not present

## 2018-06-11 DIAGNOSIS — M25632 Stiffness of left wrist, not elsewhere classified: Secondary | ICD-10-CM | POA: Diagnosis not present

## 2018-06-11 DIAGNOSIS — M25612 Stiffness of left shoulder, not elsewhere classified: Secondary | ICD-10-CM | POA: Diagnosis not present

## 2018-06-11 DIAGNOSIS — M25642 Stiffness of left hand, not elsewhere classified: Secondary | ICD-10-CM | POA: Diagnosis not present

## 2018-06-16 DIAGNOSIS — M25612 Stiffness of left shoulder, not elsewhere classified: Secondary | ICD-10-CM | POA: Diagnosis not present

## 2018-06-16 DIAGNOSIS — M25642 Stiffness of left hand, not elsewhere classified: Secondary | ICD-10-CM | POA: Diagnosis not present

## 2018-06-16 DIAGNOSIS — M25632 Stiffness of left wrist, not elsewhere classified: Secondary | ICD-10-CM | POA: Diagnosis not present

## 2018-06-16 DIAGNOSIS — M6281 Muscle weakness (generalized): Secondary | ICD-10-CM | POA: Diagnosis not present

## 2018-06-18 DIAGNOSIS — M25612 Stiffness of left shoulder, not elsewhere classified: Secondary | ICD-10-CM | POA: Diagnosis not present

## 2018-06-18 DIAGNOSIS — M25642 Stiffness of left hand, not elsewhere classified: Secondary | ICD-10-CM | POA: Diagnosis not present

## 2018-06-18 DIAGNOSIS — M25632 Stiffness of left wrist, not elsewhere classified: Secondary | ICD-10-CM | POA: Diagnosis not present

## 2018-06-18 DIAGNOSIS — M6281 Muscle weakness (generalized): Secondary | ICD-10-CM | POA: Diagnosis not present

## 2018-06-28 DIAGNOSIS — M25612 Stiffness of left shoulder, not elsewhere classified: Secondary | ICD-10-CM | POA: Diagnosis not present

## 2018-06-28 DIAGNOSIS — M6281 Muscle weakness (generalized): Secondary | ICD-10-CM | POA: Diagnosis not present

## 2018-06-28 DIAGNOSIS — M25642 Stiffness of left hand, not elsewhere classified: Secondary | ICD-10-CM | POA: Diagnosis not present

## 2018-06-28 DIAGNOSIS — M25632 Stiffness of left wrist, not elsewhere classified: Secondary | ICD-10-CM | POA: Diagnosis not present

## 2018-07-01 DIAGNOSIS — M25642 Stiffness of left hand, not elsewhere classified: Secondary | ICD-10-CM | POA: Diagnosis not present

## 2018-07-01 DIAGNOSIS — M25632 Stiffness of left wrist, not elsewhere classified: Secondary | ICD-10-CM | POA: Diagnosis not present

## 2018-07-01 DIAGNOSIS — M25612 Stiffness of left shoulder, not elsewhere classified: Secondary | ICD-10-CM | POA: Diagnosis not present

## 2018-07-01 DIAGNOSIS — M6281 Muscle weakness (generalized): Secondary | ICD-10-CM | POA: Diagnosis not present

## 2018-07-09 DIAGNOSIS — M6281 Muscle weakness (generalized): Secondary | ICD-10-CM | POA: Diagnosis not present

## 2018-07-09 DIAGNOSIS — M25612 Stiffness of left shoulder, not elsewhere classified: Secondary | ICD-10-CM | POA: Diagnosis not present

## 2018-07-09 DIAGNOSIS — M25632 Stiffness of left wrist, not elsewhere classified: Secondary | ICD-10-CM | POA: Diagnosis not present

## 2018-07-09 DIAGNOSIS — M25642 Stiffness of left hand, not elsewhere classified: Secondary | ICD-10-CM | POA: Diagnosis not present

## 2018-07-22 DIAGNOSIS — M6281 Muscle weakness (generalized): Secondary | ICD-10-CM | POA: Diagnosis not present

## 2018-07-22 DIAGNOSIS — M25612 Stiffness of left shoulder, not elsewhere classified: Secondary | ICD-10-CM | POA: Diagnosis not present

## 2018-07-22 DIAGNOSIS — M25642 Stiffness of left hand, not elsewhere classified: Secondary | ICD-10-CM | POA: Diagnosis not present

## 2018-07-22 DIAGNOSIS — M25632 Stiffness of left wrist, not elsewhere classified: Secondary | ICD-10-CM | POA: Diagnosis not present

## 2018-07-28 DIAGNOSIS — M6281 Muscle weakness (generalized): Secondary | ICD-10-CM | POA: Diagnosis not present

## 2018-07-28 DIAGNOSIS — M25632 Stiffness of left wrist, not elsewhere classified: Secondary | ICD-10-CM | POA: Diagnosis not present

## 2018-07-28 DIAGNOSIS — M25612 Stiffness of left shoulder, not elsewhere classified: Secondary | ICD-10-CM | POA: Diagnosis not present

## 2018-07-28 DIAGNOSIS — M25642 Stiffness of left hand, not elsewhere classified: Secondary | ICD-10-CM | POA: Diagnosis not present

## 2018-07-30 DIAGNOSIS — M6281 Muscle weakness (generalized): Secondary | ICD-10-CM | POA: Diagnosis not present

## 2018-07-30 DIAGNOSIS — M25612 Stiffness of left shoulder, not elsewhere classified: Secondary | ICD-10-CM | POA: Diagnosis not present

## 2018-07-30 DIAGNOSIS — M25632 Stiffness of left wrist, not elsewhere classified: Secondary | ICD-10-CM | POA: Diagnosis not present

## 2018-07-30 DIAGNOSIS — M25642 Stiffness of left hand, not elsewhere classified: Secondary | ICD-10-CM | POA: Diagnosis not present

## 2018-08-03 DIAGNOSIS — M25642 Stiffness of left hand, not elsewhere classified: Secondary | ICD-10-CM | POA: Diagnosis not present

## 2018-08-03 DIAGNOSIS — M25632 Stiffness of left wrist, not elsewhere classified: Secondary | ICD-10-CM | POA: Diagnosis not present

## 2018-08-03 DIAGNOSIS — M6281 Muscle weakness (generalized): Secondary | ICD-10-CM | POA: Diagnosis not present

## 2018-08-03 DIAGNOSIS — M25612 Stiffness of left shoulder, not elsewhere classified: Secondary | ICD-10-CM | POA: Diagnosis not present

## 2018-08-06 DIAGNOSIS — M25632 Stiffness of left wrist, not elsewhere classified: Secondary | ICD-10-CM | POA: Diagnosis not present

## 2018-08-06 DIAGNOSIS — M25612 Stiffness of left shoulder, not elsewhere classified: Secondary | ICD-10-CM | POA: Diagnosis not present

## 2018-08-06 DIAGNOSIS — M25642 Stiffness of left hand, not elsewhere classified: Secondary | ICD-10-CM | POA: Diagnosis not present

## 2018-08-06 DIAGNOSIS — M6281 Muscle weakness (generalized): Secondary | ICD-10-CM | POA: Diagnosis not present

## 2018-08-18 DIAGNOSIS — M6281 Muscle weakness (generalized): Secondary | ICD-10-CM | POA: Diagnosis not present

## 2018-08-18 DIAGNOSIS — M25612 Stiffness of left shoulder, not elsewhere classified: Secondary | ICD-10-CM | POA: Diagnosis not present

## 2018-08-18 DIAGNOSIS — M25642 Stiffness of left hand, not elsewhere classified: Secondary | ICD-10-CM | POA: Diagnosis not present

## 2018-08-18 DIAGNOSIS — M25632 Stiffness of left wrist, not elsewhere classified: Secondary | ICD-10-CM | POA: Diagnosis not present

## 2018-08-26 DIAGNOSIS — M25642 Stiffness of left hand, not elsewhere classified: Secondary | ICD-10-CM | POA: Diagnosis not present

## 2018-08-26 DIAGNOSIS — M6281 Muscle weakness (generalized): Secondary | ICD-10-CM | POA: Diagnosis not present

## 2018-08-26 DIAGNOSIS — M25632 Stiffness of left wrist, not elsewhere classified: Secondary | ICD-10-CM | POA: Diagnosis not present

## 2018-08-26 DIAGNOSIS — M25612 Stiffness of left shoulder, not elsewhere classified: Secondary | ICD-10-CM | POA: Diagnosis not present

## 2018-08-31 DIAGNOSIS — M25632 Stiffness of left wrist, not elsewhere classified: Secondary | ICD-10-CM | POA: Diagnosis not present

## 2018-08-31 DIAGNOSIS — M25642 Stiffness of left hand, not elsewhere classified: Secondary | ICD-10-CM | POA: Diagnosis not present

## 2018-08-31 DIAGNOSIS — M25612 Stiffness of left shoulder, not elsewhere classified: Secondary | ICD-10-CM | POA: Diagnosis not present

## 2018-08-31 DIAGNOSIS — M6281 Muscle weakness (generalized): Secondary | ICD-10-CM | POA: Diagnosis not present

## 2018-09-07 DIAGNOSIS — M25632 Stiffness of left wrist, not elsewhere classified: Secondary | ICD-10-CM | POA: Diagnosis not present

## 2018-09-07 DIAGNOSIS — M25612 Stiffness of left shoulder, not elsewhere classified: Secondary | ICD-10-CM | POA: Diagnosis not present

## 2018-09-07 DIAGNOSIS — M6281 Muscle weakness (generalized): Secondary | ICD-10-CM | POA: Diagnosis not present

## 2018-09-07 DIAGNOSIS — M25642 Stiffness of left hand, not elsewhere classified: Secondary | ICD-10-CM | POA: Diagnosis not present

## 2018-09-10 DIAGNOSIS — M6281 Muscle weakness (generalized): Secondary | ICD-10-CM | POA: Diagnosis not present

## 2018-09-10 DIAGNOSIS — M25612 Stiffness of left shoulder, not elsewhere classified: Secondary | ICD-10-CM | POA: Diagnosis not present

## 2018-09-10 DIAGNOSIS — M25632 Stiffness of left wrist, not elsewhere classified: Secondary | ICD-10-CM | POA: Diagnosis not present

## 2018-09-10 DIAGNOSIS — M25642 Stiffness of left hand, not elsewhere classified: Secondary | ICD-10-CM | POA: Diagnosis not present

## 2018-09-14 DIAGNOSIS — S143XXD Injury of brachial plexus, subsequent encounter: Secondary | ICD-10-CM | POA: Diagnosis not present

## 2018-10-19 DIAGNOSIS — G588 Other specified mononeuropathies: Secondary | ICD-10-CM | POA: Diagnosis not present

## 2018-10-19 DIAGNOSIS — I10 Essential (primary) hypertension: Secondary | ICD-10-CM | POA: Diagnosis not present

## 2018-10-19 DIAGNOSIS — K8591 Acute pancreatitis with uninfected necrosis, unspecified: Secondary | ICD-10-CM | POA: Diagnosis not present

## 2018-11-09 DIAGNOSIS — L279 Dermatitis due to unspecified substance taken internally: Secondary | ICD-10-CM | POA: Diagnosis not present

## 2019-01-12 DIAGNOSIS — F419 Anxiety disorder, unspecified: Secondary | ICD-10-CM | POA: Diagnosis not present

## 2019-01-12 DIAGNOSIS — I10 Essential (primary) hypertension: Secondary | ICD-10-CM | POA: Diagnosis not present

## 2019-01-12 DIAGNOSIS — Z0000 Encounter for general adult medical examination without abnormal findings: Secondary | ICD-10-CM | POA: Diagnosis not present

## 2019-01-12 DIAGNOSIS — Z1389 Encounter for screening for other disorder: Secondary | ICD-10-CM | POA: Diagnosis not present

## 2019-01-19 DIAGNOSIS — R300 Dysuria: Secondary | ICD-10-CM | POA: Diagnosis not present

## 2019-01-19 DIAGNOSIS — R21 Rash and other nonspecific skin eruption: Secondary | ICD-10-CM | POA: Diagnosis not present

## 2019-01-19 DIAGNOSIS — N3000 Acute cystitis without hematuria: Secondary | ICD-10-CM | POA: Diagnosis not present

## 2019-01-19 DIAGNOSIS — B029 Zoster without complications: Secondary | ICD-10-CM | POA: Diagnosis not present

## 2019-01-19 DIAGNOSIS — N390 Urinary tract infection, site not specified: Secondary | ICD-10-CM | POA: Diagnosis not present

## 2019-02-10 ENCOUNTER — Other Ambulatory Visit: Payer: Self-pay

## 2019-02-16 DIAGNOSIS — Z09 Encounter for follow-up examination after completed treatment for conditions other than malignant neoplasm: Secondary | ICD-10-CM | POA: Diagnosis not present

## 2019-02-16 DIAGNOSIS — Z96612 Presence of left artificial shoulder joint: Secondary | ICD-10-CM | POA: Diagnosis not present

## 2019-02-22 DIAGNOSIS — Z1231 Encounter for screening mammogram for malignant neoplasm of breast: Secondary | ICD-10-CM | POA: Diagnosis not present

## 2019-04-14 DIAGNOSIS — I10 Essential (primary) hypertension: Secondary | ICD-10-CM | POA: Diagnosis not present

## 2019-04-14 DIAGNOSIS — F411 Generalized anxiety disorder: Secondary | ICD-10-CM | POA: Diagnosis not present

## 2019-04-14 DIAGNOSIS — Z0000 Encounter for general adult medical examination without abnormal findings: Secondary | ICD-10-CM | POA: Diagnosis not present

## 2019-05-17 DIAGNOSIS — R21 Rash and other nonspecific skin eruption: Secondary | ICD-10-CM | POA: Diagnosis not present

## 2019-05-17 DIAGNOSIS — B029 Zoster without complications: Secondary | ICD-10-CM | POA: Diagnosis not present

## 2019-07-18 DIAGNOSIS — I10 Essential (primary) hypertension: Secondary | ICD-10-CM | POA: Diagnosis not present

## 2019-07-18 DIAGNOSIS — K861 Other chronic pancreatitis: Secondary | ICD-10-CM | POA: Diagnosis not present

## 2019-07-18 DIAGNOSIS — F411 Generalized anxiety disorder: Secondary | ICD-10-CM | POA: Diagnosis not present

## 2019-10-19 DIAGNOSIS — F411 Generalized anxiety disorder: Secondary | ICD-10-CM | POA: Diagnosis not present

## 2019-10-19 DIAGNOSIS — I10 Essential (primary) hypertension: Secondary | ICD-10-CM | POA: Diagnosis not present

## 2019-10-19 DIAGNOSIS — K861 Other chronic pancreatitis: Secondary | ICD-10-CM | POA: Diagnosis not present

## 2019-12-29 ENCOUNTER — Encounter (INDEPENDENT_AMBULATORY_CARE_PROVIDER_SITE_OTHER): Payer: Self-pay | Admitting: Ophthalmology

## 2019-12-29 ENCOUNTER — Other Ambulatory Visit: Payer: Self-pay

## 2019-12-29 ENCOUNTER — Ambulatory Visit (INDEPENDENT_AMBULATORY_CARE_PROVIDER_SITE_OTHER): Payer: Medicare Other | Admitting: Ophthalmology

## 2019-12-29 DIAGNOSIS — H35411 Lattice degeneration of retina, right eye: Secondary | ICD-10-CM

## 2019-12-29 DIAGNOSIS — H26492 Other secondary cataract, left eye: Secondary | ICD-10-CM

## 2019-12-29 DIAGNOSIS — H33329 Round hole, unspecified eye: Secondary | ICD-10-CM | POA: Insufficient documentation

## 2019-12-29 DIAGNOSIS — H33321 Round hole, right eye: Secondary | ICD-10-CM | POA: Diagnosis not present

## 2019-12-29 DIAGNOSIS — H43813 Vitreous degeneration, bilateral: Secondary | ICD-10-CM | POA: Diagnosis not present

## 2019-12-29 NOTE — Assessment & Plan Note (Signed)
OS I do recommend YAG capsulotomy to improve the acuity in the near future.

## 2019-12-29 NOTE — Assessment & Plan Note (Signed)
Consideration of laser retinopexy of the right eye for an area of retinal thinning and retinal hole that is not currently symptomatic but poses a risk for retinal detachment in the future.  This is a patient with former high myopia.

## 2019-12-29 NOTE — Progress Notes (Signed)
12/29/2019     CHIEF COMPLAINT Patient presents for Blurred Vision   HISTORY OF PRESENT ILLNESS: Shelly Maldonado is a 74 y.o. female who presents to the clinic today for:   HPI    Blurred Vision    In both eyes.  Onset was gradual.  Vision is blurred.  Severity is moderate.  Occurring intermittently.  It is worse throughout the day.  Since onset it is stable.  Treatments tried include no treatments.  Response to treatment was no improvement.  I, the attending physician,  performed the HPI with the patient and updated documentation appropriately.          Comments    Pt referred by Dr. Olena Leatherwood for Possible RD OS worse than OD Pt was seen this AM by Dr. Olena Leatherwood  Pt c/o vision in OS being worse than OD vision. Pt states a few months ago she started seeing blue "bubbles" in OS vision. Pt states she sees them mainly when she is outside.        Last edited by Elyse Jarvis on 12/29/2019  3:08 PM. (History)      Referring physician: Toma Deiters, MD [address] [place],  [place] [address]  HISTORICAL INFORMATION:   Selected notes from the MEDICAL RECORD NUMBER       CURRENT MEDICATIONS: No current outpatient medications on file. (Ophthalmic Drugs)   No current facility-administered medications for this visit. (Ophthalmic Drugs)   Current Outpatient Medications (Other)  Medication Sig  . bisoprolol-hydrochlorothiazide (ZIAC) 10-6.25 MG tablet Take 1 tablet by mouth daily.  . cephALEXin (KEFLEX) 500 MG capsule Take 500 mg by mouth 2 (two) times daily.  . DULoxetine (CYMBALTA) 60 MG capsule Take 60 mg by mouth daily.  Marland Kitchen gabapentin (NEURONTIN) 300 MG capsule Take 300 mg by mouth 3 (three) times daily.  Marland Kitchen ibuprofen (ADVIL,MOTRIN) 200 MG tablet Take 200 mg by mouth every 6 (six) hours as needed for moderate pain.   No current facility-administered medications for this visit. (Other)      REVIEW OF SYSTEMS:    ALLERGIES Allergies  Allergen Reactions  . Codeine  Other (See Comments)    unknown  . Morphine And Related     Vein became red    PAST MEDICAL HISTORY Past Medical History:  Diagnosis Date  . Hypertension   . Pancreatitis   . Shingles    Past Surgical History:  Procedure Laterality Date  . CATARACT EXTRACTION Bilateral 2016  . CHOLECYSTECTOMY    . COLONOSCOPY N/A 03/04/2018   Procedure: COLONOSCOPY;  Surgeon: Malissa Hippo, MD;  Location: [facility];  Service: Endoscopy;  Laterality: N/A;  12:45  . complete hysterecomy      FAMILY HISTORY Family History  Problem Relation Age of Onset  . Colon cancer Neg Hx     SOCIAL HISTORY Social History   Tobacco Use  . Smoking status: Former Games developer  . Smokeless tobacco: Never Used  . Tobacco comment: smoked 48 yrs ago.   Vaping Use  . Vaping Use: Never used  Substance Use Topics  . Alcohol use: Never  . Drug use: Never         OPHTHALMIC EXAM:  Base Eye Exam    Visual Acuity (Snellen - Linear)      Right Left   Dist [place] 20/30 + 20/60 -1   Dist ph [place]  20/30 +       Pupils      Pupils Dark Light  Shape React APD   Right PERRL 4 3 Round Brisk None   Left PERRL 4 3 Round Brisk None       Visual Fields (Counting fingers)      Left Right    Full Full       Neuro/Psych    Oriented x3: Yes   Mood/Affect: Normal        Slit Lamp and Fundus Exam    External Exam      Right Left   External Normal Normal       Slit Lamp Exam      Right Left   Lids/Lashes Normal Normal   Conjunctiva/Sclera White and quiet White and quiet   Cornea Clear Clear   Anterior Chamber Deep and quiet Deep and quiet   Iris Round and reactive Round and reactive   Lens Centered posterior chamber intraocular lens Centered posterior chamber intraocular lens, 3+ Posterior capsular opacification   Anterior Vitreous Normal Normal       Fundus Exam      Right Left   Posterior Vitreous Posterior vitreous detachment Posterior vitreous detachment   Disc Peripapillary atrophy Normal    C/D Ratio 0.15 0.15   Macula Normal Normal   Vessels Normal Normal   Periphery Lattice degeneration with holes Normal,, no holes or tears          IMAGING AND PROCEDURES  Imaging and Procedures for 12/29/19  OCT, Retina - OU - Both Eyes       Right Eye Quality was good. Scan locations included subfoveal. Central Foveal Thickness: 268. Findings include normal observations.   Left Eye Quality was good. Scan locations included subfoveal. Central Foveal Thickness: 339.   Notes Severe media opacity OS secondary to the posterior capsule changes                ASSESSMENT/PLAN:  Posterior vitreous detachment of both eyes   The nature of posterior vitreous detachment was discussed with the patient as well as its physiology, its age prevalence, and its possible implication regarding retinal breaks and detachment.  An informational brochure was given to the patient.  All the patient's questions were answered.  The patient was asked to return if new or different flashes or floaters develops.   Patient was instructed to contact office immediately if any changes were noticed. I explained to the patient that vitreous inside the eye is similar to jello inside a bowl. As the jello melts it can start to pull away from the bowl, similarly the vitreous throughout our lives can begin to pull away from the retina. That process is called a posterior vitreous detachment. In some cases, the vitreous can tug hard enough on the retina to form a retinal tear. I discussed with the patient the signs and symptoms of a retinal detachment.  Do not rub the eye.  OS with new symptoms although there are no holes or tears the posterior capsule does hamper somewhat peripheral viewing but adequate viewing enough to notice not a retinal detachment and no likely tears  Lattice degeneration of peripheral retina, right Consideration of laser retinopexy of the right eye for an area of retinal thinning and retinal hole  that is not currently symptomatic but poses a risk for retinal detachment in the future.  This is a patient with former high myopia.  Left posterior capsular opacification OS I do recommend YAG capsulotomy to improve the acuity in the near future.      ICD-10-CM   1.  Posterior vitreous detachment of both eyes  H43.813 OCT, Retina - OU - Both Eyes  2. Left posterior capsular opacification  H26.492   3. Lattice degeneration of peripheral retina, right  H35.411   4. Round hole of right retina without detachment  H33.321     1.  2.  3.  Ophthalmic Meds Ordered this visit:  No orders of the defined types were placed in this encounter.      Return in about 1 week (around 01/05/2020) for OS,, YAG capsulotomy schedule soon.  There are no Patient Instructions on file for this visit.   Explained the diagnoses, plan, and follow up with the patient and they expressed understanding.  Patient expressed understanding of the importance of proper follow up care.   Clent Demark Lanorris Kalisz M.D. Diseases & Surgery of the Retina and Vitreous [facility] 12/29/19     Abbreviations: M myopia (nearsighted); A astigmatism; H hyperopia (farsighted); P presbyopia; Mrx spectacle prescription;  CTL contact lenses; OD right eye; OS left eye; OU both eyes  XT exotropia; ET esotropia; PEK punctate epithelial keratitis; PEE punctate epithelial erosions; DES dry eye syndrome; MGD meibomian gland dysfunction; ATs artificial tears; PFAT's preservative free artificial tears; [employer] nuclear sclerotic cataract; PSC posterior subcapsular cataract; ERM epi-retinal membrane; PVD posterior vitreous detachment; RD retinal detachment; DM diabetes mellitus; DR diabetic retinopathy; NPDR non-proliferative diabetic retinopathy; PDR proliferative diabetic retinopathy; CSME clinically significant macular edema; DME diabetic macular edema; dbh dot blot hemorrhages; CWS cotton wool spot; POAG primary open angle glaucoma;  C/D cup-to-disc ratio; HVF humphrey visual field; GVF goldmann visual field; OCT optical coherence tomography; IOP intraocular pressure; BRVO Branch retinal vein occlusion; CRVO central retinal vein occlusion; CRAO central retinal artery occlusion; BRAO branch retinal artery occlusion; RT retinal tear; SB scleral buckle; PPV pars plana vitrectomy; VH Vitreous hemorrhage; PRP panretinal laser photocoagulation; IVK intravitreal kenalog; VMT vitreomacular traction; MH Macular hole;  NVD neovascularization of the disc; NVE neovascularization elsewhere; AREDS age related eye disease study; ARMD age related macular degeneration; POAG primary open angle glaucoma; EBMD epithelial/anterior basement membrane dystrophy; ACIOL anterior chamber intraocular lens; IOL intraocular lens; PCIOL posterior chamber intraocular lens; Phaco/IOL phacoemulsification with intraocular lens placement; [place] photorefractive keratectomy; LASIK laser assisted in situ keratomileusis; HTN hypertension; DM diabetes mellitus; COPD chronic obstructive pulmonary disease

## 2019-12-29 NOTE — Assessment & Plan Note (Signed)
The nature of posterior vitreous detachment was discussed with the patient as well as its physiology, its age prevalence, and its possible implication regarding retinal breaks and detachment.  An informational brochure was given to the patient.  All the patient's questions were answered.  The patient was asked to return if new or different flashes or floaters develops.   Patient was instructed to contact office immediately if any changes were noticed. I explained to the patient that vitreous inside the eye is similar to jello inside a bowl. As the jello melts it can start to pull away from the bowl, similarly the vitreous throughout our lives can begin to pull away from the retina. That process is called a posterior vitreous detachment. In some cases, the vitreous can tug hard enough on the retina to form a retinal tear. I discussed with the patient the signs and symptoms of a retinal detachment.  Do not rub the eye.  OS with new symptoms although there are no holes or tears the posterior capsule does hamper somewhat peripheral viewing but adequate viewing enough to notice not a retinal detachment and no likely tears

## 2020-01-04 IMAGING — CT CT ABD-PELV W/ CM
2 of 5 series · 16 of 46 positions shown, 18 images · IV contrast (Isovue)
Comparison: 12/27/2017

CLINICAL DATA: Left upper quadrant pain. Elevated liver enzymes.
Recent history of pancreatitis.

EXAM:
CT ABDOMEN AND PELVIS WITH CONTRAST
TECHNIQUE: Multidetector CT imaging of the abdomen and pelvis was performed
using the standard protocol following bolus administration of
intravenous contrast.
CONTRAST:  100mL RTL7X1-H88 IOPAMIDOL (RTL7X1-H88) INJECTION 61%

[Series 2: axial st · axial · 0.70mm/px · z∈[-377,+13]mm · 13 of 88 slices shown, 15 images]
[im 5/88  soft-tissue]
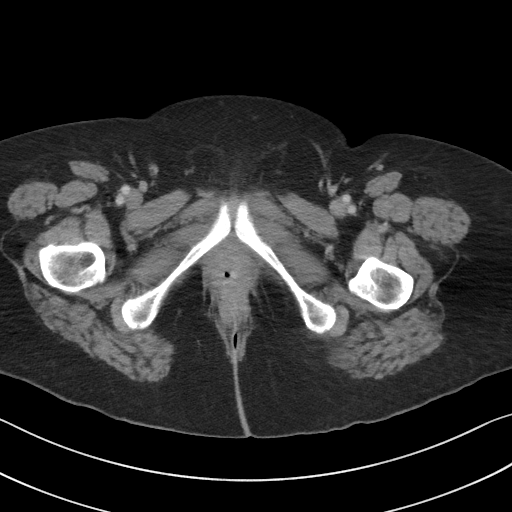
[im 5/88  bone]
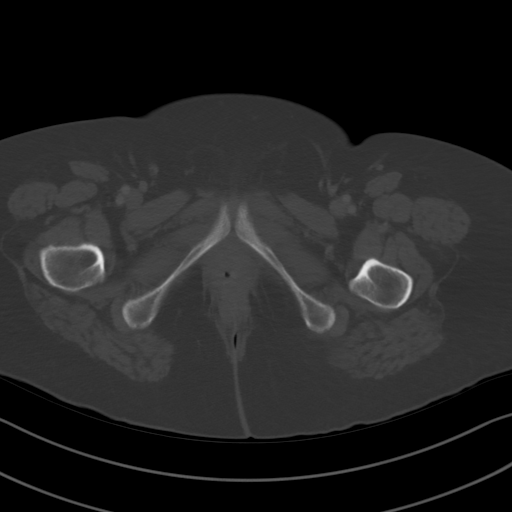
[im 10/88  soft-tissue]
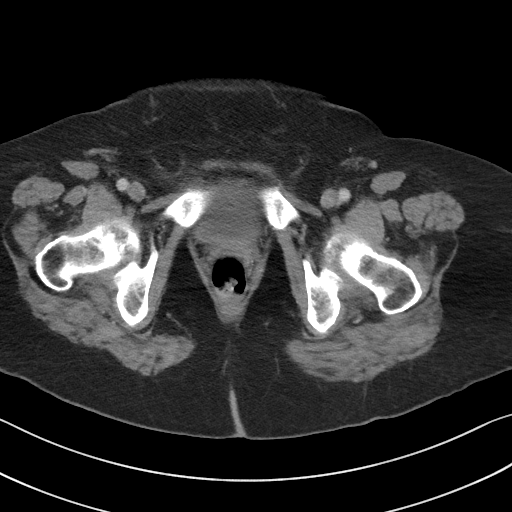
[im 20/88  soft-tissue]
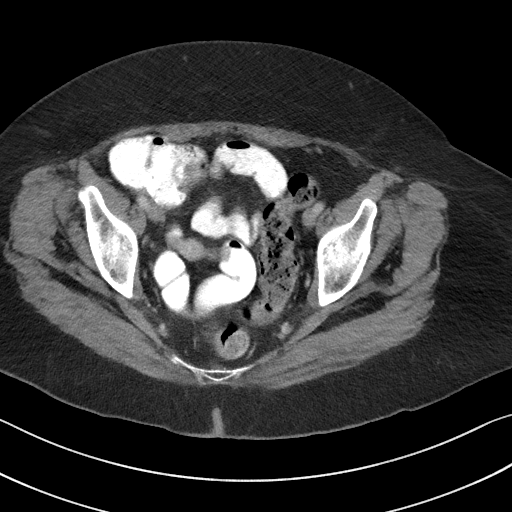
[im 25/88  soft-tissue]
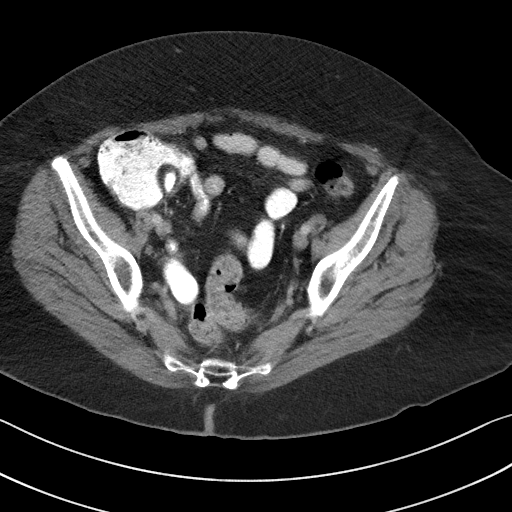
[im 30/88  soft-tissue]
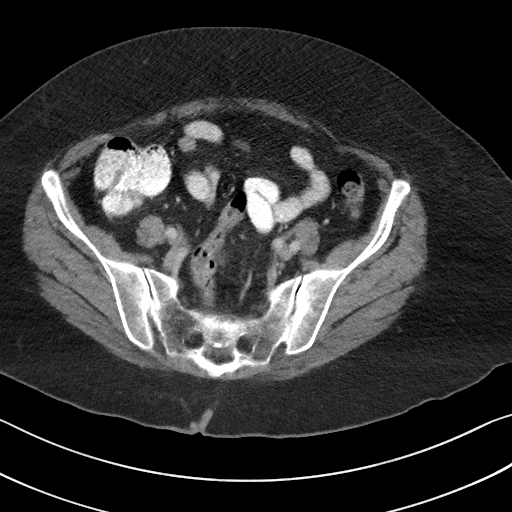
[im 39/88  soft-tissue]
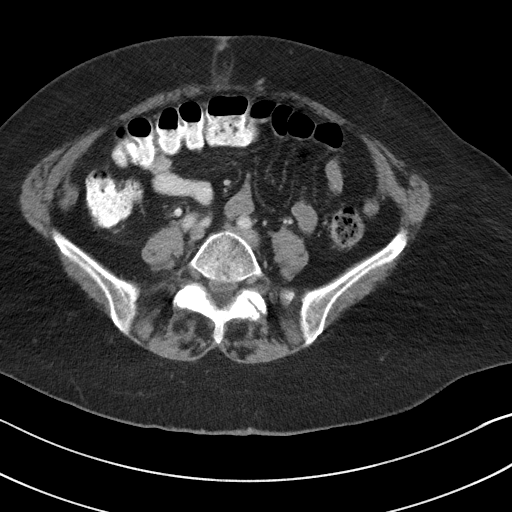
[im 44/88  soft-tissue]
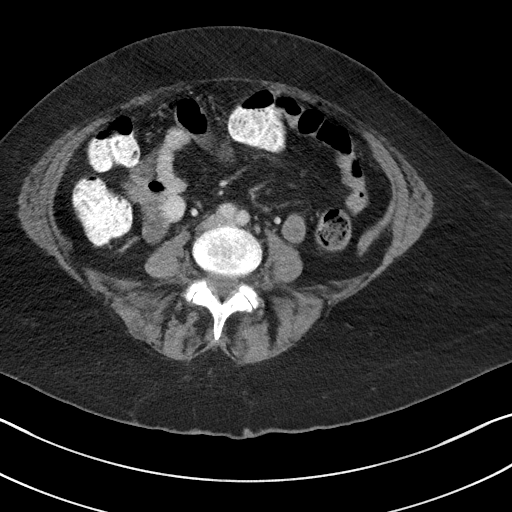
[im 49/88  soft-tissue]
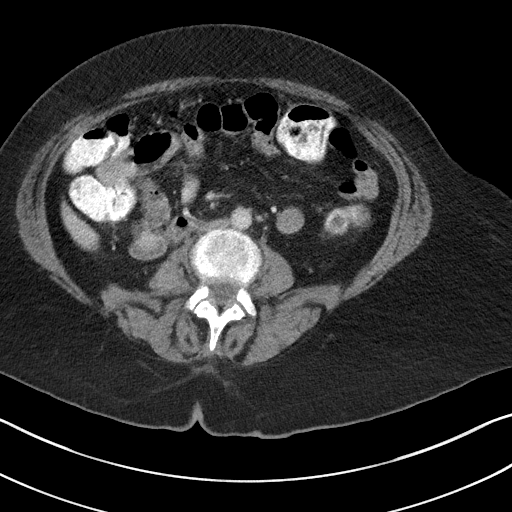
[im 59/88  soft-tissue]
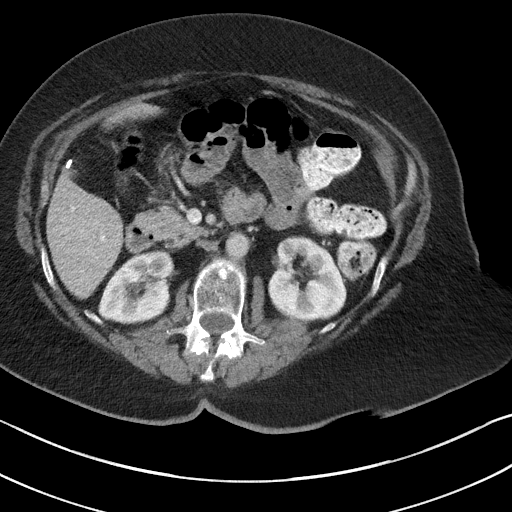
[im 59/88  bone]
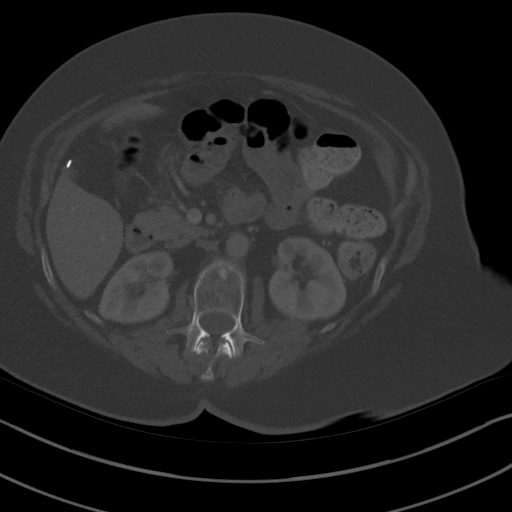
[im 63/88  soft-tissue]
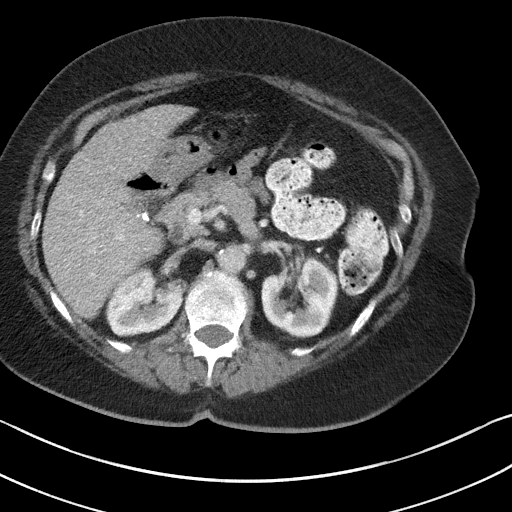
[im 68/88  soft-tissue]
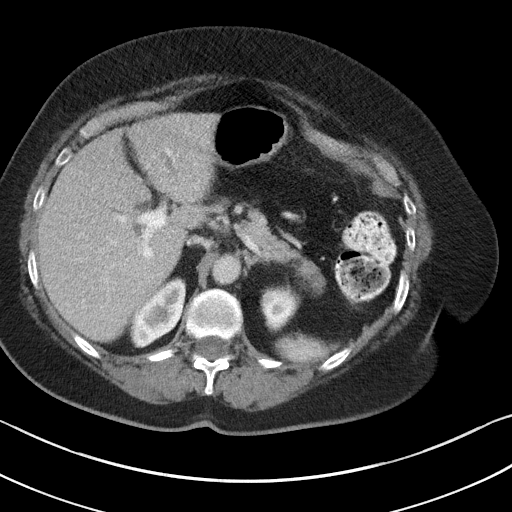
[im 78/88  soft-tissue]
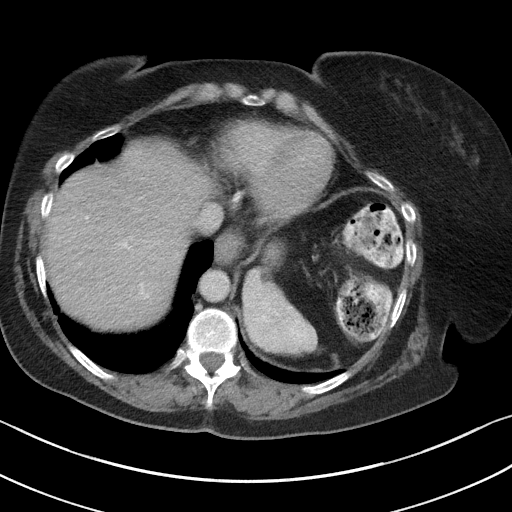
[im 83/88  soft-tissue]
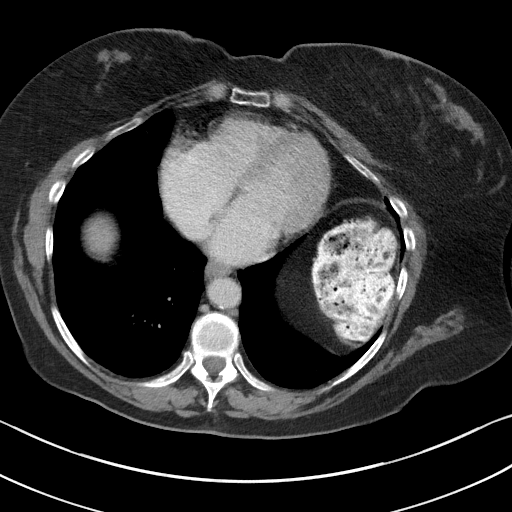

[Series 6: coronal st · coronal · 0.77mm/px · 3 of 107 slices shown]
[im 36/107  soft-tissue]
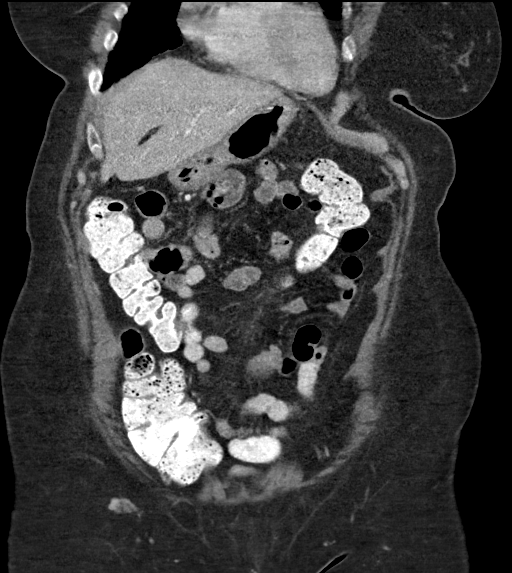
[im 48/107  soft-tissue]
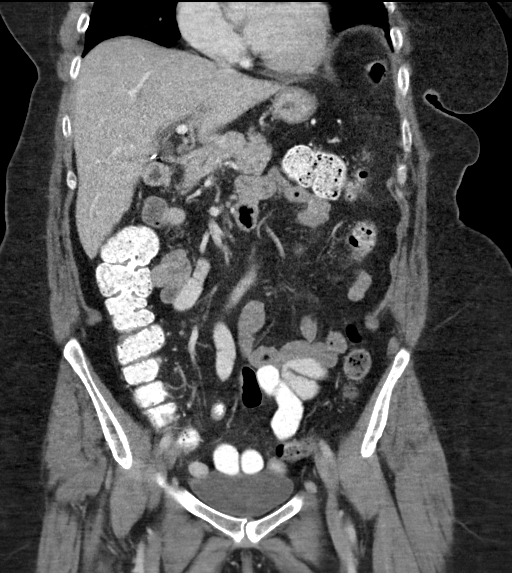
[im 59/107  soft-tissue]
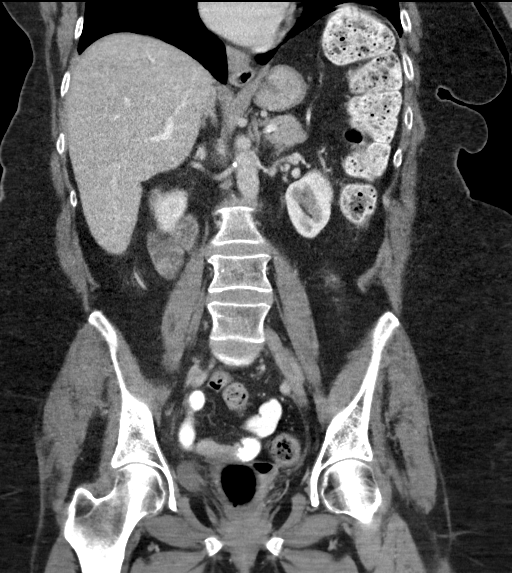

[16 of 46 positions shown; findings below may reference images not displayed]

FINDINGS: Lower chest: Subsegmental atelectasis for scarring at the bases.

Hepatobiliary: Post cholecystectomy. Liver is within normal limits.
Expected dilatation of the biliary tree.

Pancreas: Unremarkable.  Expected dilatation of the pancreatic duct.

Spleen: Unremarkable

Adrenals/Urinary Tract: Tiny cyst in the anterior mid left kidney.
Adrenal glands are unremarkable. Right kidney is unremarkable.
Bladder is relatively decompressed.

Stomach/Bowel: There is focal wall thickening involving the mucosa
of the medial cecum. See image 66 of series 2. The appendix is
nonvisualized. No evidence of small-bowel obstruction. Nonspecific
air-fluid levels are scattered throughout the colon. Stomach is
decompressed. Duodenum is unremarkable.

Vascular/Lymphatic: Aortic atherosclerotic calcification. No
evidence of aneurysm. No abnormal retroperitoneal adenopathy.

Reproductive: Uterus is absent.  Adnexa are within normal limits.

Other: No free fluid.

Musculoskeletal: No vertebral compression.
IMPRESSION: There is focal wall thickening in the cecum. Differential diagnosis
includes pseudo thickening from decompression, an inflammatory
process, or malignancy. Correlate clinically as for the need for
follow-up imaging versus endoscopy.

## 2020-01-05 ENCOUNTER — Encounter (INDEPENDENT_AMBULATORY_CARE_PROVIDER_SITE_OTHER): Payer: Self-pay | Admitting: Ophthalmology

## 2020-01-05 ENCOUNTER — Other Ambulatory Visit: Payer: Self-pay

## 2020-01-05 ENCOUNTER — Ambulatory Visit (INDEPENDENT_AMBULATORY_CARE_PROVIDER_SITE_OTHER): Payer: Medicare Other | Admitting: Ophthalmology

## 2020-01-05 DIAGNOSIS — H26492 Other secondary cataract, left eye: Secondary | ICD-10-CM | POA: Diagnosis not present

## 2020-01-05 DIAGNOSIS — H33321 Round hole, right eye: Secondary | ICD-10-CM

## 2020-01-05 NOTE — Assessment & Plan Note (Signed)
YAG laser capsulotomy completed today OS

## 2020-01-05 NOTE — Assessment & Plan Note (Signed)
OD, will plan laser retinopexy to area inferotemporal quadrant

## 2020-01-23 DIAGNOSIS — K861 Other chronic pancreatitis: Secondary | ICD-10-CM | POA: Diagnosis not present

## 2020-01-23 DIAGNOSIS — Z0000 Encounter for general adult medical examination without abnormal findings: Secondary | ICD-10-CM | POA: Diagnosis not present

## 2020-01-23 DIAGNOSIS — I10 Essential (primary) hypertension: Secondary | ICD-10-CM | POA: Diagnosis not present

## 2020-01-23 DIAGNOSIS — Z1331 Encounter for screening for depression: Secondary | ICD-10-CM | POA: Diagnosis not present

## 2020-01-23 DIAGNOSIS — E785 Hyperlipidemia, unspecified: Secondary | ICD-10-CM | POA: Diagnosis not present

## 2020-01-23 DIAGNOSIS — F3342 Major depressive disorder, recurrent, in full remission: Secondary | ICD-10-CM | POA: Diagnosis not present

## 2020-01-30 ENCOUNTER — Ambulatory Visit (INDEPENDENT_AMBULATORY_CARE_PROVIDER_SITE_OTHER): Payer: Medicare Other | Admitting: Ophthalmology

## 2020-01-30 ENCOUNTER — Encounter (INDEPENDENT_AMBULATORY_CARE_PROVIDER_SITE_OTHER): Payer: Self-pay | Admitting: Ophthalmology

## 2020-01-30 ENCOUNTER — Other Ambulatory Visit: Payer: Self-pay

## 2020-01-30 DIAGNOSIS — H33321 Round hole, right eye: Secondary | ICD-10-CM

## 2020-01-30 NOTE — Progress Notes (Signed)
01/30/2020     CHIEF COMPLAINT Patient presents for Retina Follow Up   HISTORY OF PRESENT ILLNESS: Shelly Maldonado is a 74 y.o. female who presents to the clinic today for:   HPI    Retina Follow Up    Patient presents with  Other.  In right eye.  This started 3 weeks ago.  Duration of 3 weeks.  Since onset it is stable.          Comments    3 week f/u possible Retinopexy OD and dilated exam today. Pt denies noticeable changes to [place] OU since last visit. Pt denies ocular pain, flashes of light, or floaters OU.         Last edited by Bonnye Fava, COA on 01/30/2020  9:45 AM. (History)      Referring physician: Toma Deiters, MD [address] [place],  [place] [address]  HISTORICAL INFORMATION:   Selected notes from the MEDICAL RECORD NUMBER       CURRENT MEDICATIONS: No current outpatient medications on file. (Ophthalmic Drugs)   No current facility-administered medications for this visit. (Ophthalmic Drugs)   Current Outpatient Medications (Other)  Medication Sig  . bisoprolol-hydrochlorothiazide (ZIAC) 10-6.25 MG tablet Take 1 tablet by mouth daily.  . cephALEXin (KEFLEX) 500 MG capsule Take 500 mg by mouth 2 (two) times daily.  . DULoxetine (CYMBALTA) 60 MG capsule Take 60 mg by mouth daily.  Marland Kitchen gabapentin (NEURONTIN) 300 MG capsule Take 300 mg by mouth 3 (three) times daily.  Marland Kitchen ibuprofen (ADVIL,MOTRIN) 200 MG tablet Take 200 mg by mouth every 6 (six) hours as needed for moderate pain.   No current facility-administered medications for this visit. (Other)      REVIEW OF SYSTEMS:    ALLERGIES Allergies  Allergen Reactions  . Codeine Other (See Comments)    unknown  . Morphine And Related     Vein became red    PAST MEDICAL HISTORY Past Medical History:  Diagnosis Date  . Hypertension   . Pancreatitis   . Shingles    Past Surgical History:  Procedure Laterality Date  . CATARACT EXTRACTION Bilateral 2016  . CHOLECYSTECTOMY    .  COLONOSCOPY N/A 03/04/2018   Procedure: COLONOSCOPY;  Surgeon: Malissa Hippo, MD;  Location: [facility];  Service: Endoscopy;  Laterality: N/A;  12:45  . complete hysterecomy      FAMILY HISTORY Family History  Problem Relation Age of Onset  . Colon cancer Neg Hx     SOCIAL HISTORY Social History   Tobacco Use  . Smoking status: Former Games developer  . Smokeless tobacco: Never Used  . Tobacco comment: smoked 48 yrs ago.   Vaping Use  . Vaping Use: Never used  Substance Use Topics  . Alcohol use: Never  . Drug use: Never         OPHTHALMIC EXAM:  Base Eye Exam    Visual Acuity (ETDRS)      Right Left   Dist [place] 20/25 +2 20/20       Tonometry (Tonopen, 9:49 AM)      Right Left   Pressure 11 12       Pupils      Pupils Dark Light Shape React APD   Right PERRL 4 3 Round Brisk None   Left PERRL 4 3 Round Brisk None       Visual Fields (Counting fingers)      Left Right    Full Full  Extraocular Movement      Right Left    Full Full       Neuro/Psych    Oriented x3: Yes   Mood/Affect: Normal       Dilation    Right eye: 1.0% Mydriacyl, 2.5% Phenylephrine @ 9:49 AM        Slit Lamp and Fundus Exam    External Exam      Right Left   External Normal Normal       Slit Lamp Exam      Right Left   Lids/Lashes Normal Normal   Conjunctiva/Sclera White and quiet White and quiet   Cornea Clear Clear   Anterior Chamber Deep and quiet Deep and quiet   Iris Round and reactive Round and reactive   Lens Posterior chamber intraocular lens Posterior chamber intraocular lens   Vitreous Normal Normal          IMAGING AND PROCEDURES  Imaging and Procedures for 01/30/20  Repair Retinal Breaks, Laser - OD - Right Eye       Time Out Confirmed correct patient, procedure, site, and patient consented.   Anesthesia Topical anesthesia was used. Anesthetic medications included Proparacaine 0.5%.   Laser Information The type of laser was diode. Color  was yellow. The duration in seconds was 0.02. The spot size was 390 microns. Laser power was 240. Total spots was 413.   Post-op The patient tolerated the procedure well. There were no complications. The patient received written and verbal post procedure care education.   Notes Treatment around 2 areas, previous retinal hole inferotemporal on the inferotemporal arcade but also around a larger patch of lattice degeneration from the 530 to 7 o'clock position inferotemporally                ASSESSMENT/PLAN:  No problem-specific Assessment & Plan notes found for this encounter.      ICD-10-CM   1. Round hole of right retina without detachment  H33.321 Repair Retinal Breaks, Laser - OD - Right Eye    1.  Laser retinopexy OD completed today without complication inferotemporally.  2.  3.  Ophthalmic Meds Ordered this visit:  No orders of the defined types were placed in this encounter.      Return in about 4 months (around 06/01/2020) for DILATE OU, COLOR FP.  There are no Patient Instructions on file for this visit.   Explained the diagnoses, plan, and follow up with the patient and they expressed understanding.  Patient expressed understanding of the importance of proper follow up care.   Alford Highland Deveon Kisiel M.D. Diseases & Surgery of the Retina and Vitreous [facility] 01/30/20     Abbreviations: M myopia (nearsighted); A astigmatism; H hyperopia (farsighted); P presbyopia; Mrx spectacle prescription;  CTL contact lenses; OD right eye; OS left eye; OU both eyes  XT exotropia; ET esotropia; PEK punctate epithelial keratitis; PEE punctate epithelial erosions; DES dry eye syndrome; MGD meibomian gland dysfunction; ATs artificial tears; PFAT's preservative free artificial tears; [place]SC nuclear sclerotic cataract; PSC posterior subcapsular cataract; ERM epi-retinal membrane; PVD posterior vitreous detachment; RD retinal detachment; DM diabetes mellitus; DR  diabetic retinopathy; NPDR non-proliferative diabetic retinopathy; PDR proliferative diabetic retinopathy; CSME clinically significant macular edema; DME diabetic macular edema; dbh dot blot hemorrhages; CWS cotton wool spot; POAG primary open angle glaucoma; C/D cup-to-disc ratio; HVF humphrey visual field; GVF goldmann visual field; OCT optical coherence tomography; IOP intraocular pressure; BRVO Branch retinal vein occlusion; CRVO central retinal  vein occlusion; CRAO central retinal artery occlusion; BRAO branch retinal artery occlusion; RT retinal tear; SB scleral buckle; PPV pars plana vitrectomy; VH Vitreous hemorrhage; PRP panretinal laser photocoagulation; IVK intravitreal kenalog; VMT vitreomacular traction; MH Macular hole;  NVD neovascularization of the disc; NVE neovascularization elsewhere; AREDS age related eye disease study; ARMD age related macular degeneration; POAG primary open angle glaucoma; EBMD epithelial/anterior basement membrane dystrophy; ACIOL anterior chamber intraocular lens; IOL intraocular lens; PCIOL posterior chamber intraocular lens; Phaco/IOL phacoemulsification with intraocular lens placement; [place] photorefractive keratectomy; LASIK laser assisted in situ keratomileusis; HTN hypertension; DM diabetes mellitus; COPD chronic obstructive pulmonary disease

## 2020-04-25 DIAGNOSIS — K861 Other chronic pancreatitis: Secondary | ICD-10-CM | POA: Diagnosis not present

## 2020-04-25 DIAGNOSIS — E785 Hyperlipidemia, unspecified: Secondary | ICD-10-CM | POA: Diagnosis not present

## 2020-04-25 DIAGNOSIS — I10 Essential (primary) hypertension: Secondary | ICD-10-CM | POA: Diagnosis not present

## 2020-04-25 DIAGNOSIS — F3342 Major depressive disorder, recurrent, in full remission: Secondary | ICD-10-CM | POA: Diagnosis not present

## 2020-06-04 ENCOUNTER — Encounter (INDEPENDENT_AMBULATORY_CARE_PROVIDER_SITE_OTHER): Payer: Medicare Other | Admitting: Ophthalmology

## 2020-06-28 DIAGNOSIS — Z23 Encounter for immunization: Secondary | ICD-10-CM | POA: Diagnosis not present

## 2020-09-05 DIAGNOSIS — R970 Elevated carcinoembryonic antigen [CEA]: Secondary | ICD-10-CM | POA: Diagnosis not present

## 2020-09-05 DIAGNOSIS — E785 Hyperlipidemia, unspecified: Secondary | ICD-10-CM | POA: Diagnosis not present

## 2020-09-05 DIAGNOSIS — F3342 Major depressive disorder, recurrent, in full remission: Secondary | ICD-10-CM | POA: Diagnosis not present

## 2020-09-05 DIAGNOSIS — K861 Other chronic pancreatitis: Secondary | ICD-10-CM | POA: Diagnosis not present

## 2020-09-05 DIAGNOSIS — R748 Abnormal levels of other serum enzymes: Secondary | ICD-10-CM | POA: Diagnosis not present

## 2020-09-05 DIAGNOSIS — I10 Essential (primary) hypertension: Secondary | ICD-10-CM | POA: Diagnosis not present

## 2020-12-19 DIAGNOSIS — K861 Other chronic pancreatitis: Secondary | ICD-10-CM | POA: Diagnosis not present

## 2020-12-19 DIAGNOSIS — E785 Hyperlipidemia, unspecified: Secondary | ICD-10-CM | POA: Diagnosis not present

## 2020-12-19 DIAGNOSIS — F3342 Major depressive disorder, recurrent, in full remission: Secondary | ICD-10-CM | POA: Diagnosis not present

## 2020-12-19 DIAGNOSIS — I10 Essential (primary) hypertension: Secondary | ICD-10-CM | POA: Diagnosis not present

## 2021-03-13 DIAGNOSIS — F3342 Major depressive disorder, recurrent, in full remission: Secondary | ICD-10-CM | POA: Diagnosis not present

## 2021-03-13 DIAGNOSIS — I10 Essential (primary) hypertension: Secondary | ICD-10-CM | POA: Diagnosis not present

## 2021-03-13 DIAGNOSIS — E785 Hyperlipidemia, unspecified: Secondary | ICD-10-CM | POA: Diagnosis not present

## 2021-04-17 DIAGNOSIS — Z0000 Encounter for general adult medical examination without abnormal findings: Secondary | ICD-10-CM | POA: Diagnosis not present

## 2021-04-17 DIAGNOSIS — E785 Hyperlipidemia, unspecified: Secondary | ICD-10-CM | POA: Diagnosis not present

## 2021-04-17 DIAGNOSIS — K861 Other chronic pancreatitis: Secondary | ICD-10-CM | POA: Diagnosis not present

## 2021-04-17 DIAGNOSIS — Z1331 Encounter for screening for depression: Secondary | ICD-10-CM | POA: Diagnosis not present

## 2021-04-17 DIAGNOSIS — I10 Essential (primary) hypertension: Secondary | ICD-10-CM | POA: Diagnosis not present

## 2021-04-17 DIAGNOSIS — F3342 Major depressive disorder, recurrent, in full remission: Secondary | ICD-10-CM | POA: Diagnosis not present

## 2021-05-01 DIAGNOSIS — Z23 Encounter for immunization: Secondary | ICD-10-CM | POA: Diagnosis not present

## 2021-05-13 DIAGNOSIS — F3342 Major depressive disorder, recurrent, in full remission: Secondary | ICD-10-CM | POA: Diagnosis not present

## 2021-05-13 DIAGNOSIS — I10 Essential (primary) hypertension: Secondary | ICD-10-CM | POA: Diagnosis not present

## 2021-05-13 DIAGNOSIS — E785 Hyperlipidemia, unspecified: Secondary | ICD-10-CM | POA: Diagnosis not present

## 2021-06-12 DIAGNOSIS — E785 Hyperlipidemia, unspecified: Secondary | ICD-10-CM | POA: Diagnosis not present

## 2021-06-12 DIAGNOSIS — F3342 Major depressive disorder, recurrent, in full remission: Secondary | ICD-10-CM | POA: Diagnosis not present

## 2021-06-12 DIAGNOSIS — I10 Essential (primary) hypertension: Secondary | ICD-10-CM | POA: Diagnosis not present

## 2021-06-21 DIAGNOSIS — N390 Urinary tract infection, site not specified: Secondary | ICD-10-CM | POA: Diagnosis not present

## 2021-07-12 DIAGNOSIS — E785 Hyperlipidemia, unspecified: Secondary | ICD-10-CM | POA: Diagnosis not present

## 2021-07-12 DIAGNOSIS — F3342 Major depressive disorder, recurrent, in full remission: Secondary | ICD-10-CM | POA: Diagnosis not present

## 2021-07-12 DIAGNOSIS — I10 Essential (primary) hypertension: Secondary | ICD-10-CM | POA: Diagnosis not present

## 2021-09-18 DIAGNOSIS — Z0000 Encounter for general adult medical examination without abnormal findings: Secondary | ICD-10-CM | POA: Diagnosis not present

## 2021-09-18 DIAGNOSIS — Z1331 Encounter for screening for depression: Secondary | ICD-10-CM | POA: Diagnosis not present

## 2021-09-18 DIAGNOSIS — E785 Hyperlipidemia, unspecified: Secondary | ICD-10-CM | POA: Diagnosis not present

## 2021-09-18 DIAGNOSIS — K861 Other chronic pancreatitis: Secondary | ICD-10-CM | POA: Diagnosis not present

## 2021-09-18 DIAGNOSIS — I10 Essential (primary) hypertension: Secondary | ICD-10-CM | POA: Diagnosis not present

## 2021-09-18 DIAGNOSIS — F3342 Major depressive disorder, recurrent, in full remission: Secondary | ICD-10-CM | POA: Diagnosis not present

## 2021-10-10 DIAGNOSIS — Z20822 Contact with and (suspected) exposure to covid-19: Secondary | ICD-10-CM | POA: Diagnosis not present

## 2021-10-11 DIAGNOSIS — K3189 Other diseases of stomach and duodenum: Secondary | ICD-10-CM | POA: Diagnosis not present

## 2021-10-11 DIAGNOSIS — Z20822 Contact with and (suspected) exposure to covid-19: Secondary | ICD-10-CM | POA: Diagnosis present

## 2021-10-11 DIAGNOSIS — I499 Cardiac arrhythmia, unspecified: Secondary | ICD-10-CM | POA: Diagnosis not present

## 2021-10-11 DIAGNOSIS — R109 Unspecified abdominal pain: Secondary | ICD-10-CM | POA: Diagnosis not present

## 2021-10-11 DIAGNOSIS — Z79899 Other long term (current) drug therapy: Secondary | ICD-10-CM | POA: Diagnosis not present

## 2021-10-11 DIAGNOSIS — Z885 Allergy status to narcotic agent status: Secondary | ICD-10-CM | POA: Diagnosis not present

## 2021-10-11 DIAGNOSIS — Z8249 Family history of ischemic heart disease and other diseases of the circulatory system: Secondary | ICD-10-CM | POA: Diagnosis not present

## 2021-10-11 DIAGNOSIS — R1084 Generalized abdominal pain: Secondary | ICD-10-CM | POA: Diagnosis not present

## 2021-10-11 DIAGNOSIS — N390 Urinary tract infection, site not specified: Secondary | ICD-10-CM | POA: Diagnosis present

## 2021-10-11 DIAGNOSIS — R079 Chest pain, unspecified: Secondary | ICD-10-CM | POA: Diagnosis not present

## 2021-10-11 DIAGNOSIS — I4891 Unspecified atrial fibrillation: Secondary | ICD-10-CM | POA: Diagnosis not present

## 2021-10-11 DIAGNOSIS — A084 Viral intestinal infection, unspecified: Secondary | ICD-10-CM | POA: Diagnosis present

## 2021-10-11 DIAGNOSIS — I480 Paroxysmal atrial fibrillation: Secondary | ICD-10-CM | POA: Diagnosis not present

## 2021-10-11 DIAGNOSIS — R000 Tachycardia, unspecified: Secondary | ICD-10-CM | POA: Diagnosis not present

## 2021-10-11 DIAGNOSIS — I10 Essential (primary) hypertension: Secondary | ICD-10-CM | POA: Diagnosis present

## 2021-10-11 DIAGNOSIS — E876 Hypokalemia: Secondary | ICD-10-CM | POA: Diagnosis present

## 2021-10-11 DIAGNOSIS — R197 Diarrhea, unspecified: Secondary | ICD-10-CM | POA: Diagnosis not present

## 2021-10-12 DIAGNOSIS — N390 Urinary tract infection, site not specified: Secondary | ICD-10-CM | POA: Diagnosis present

## 2021-10-12 DIAGNOSIS — Z20822 Contact with and (suspected) exposure to covid-19: Secondary | ICD-10-CM | POA: Diagnosis present

## 2021-10-12 DIAGNOSIS — R109 Unspecified abdominal pain: Secondary | ICD-10-CM | POA: Diagnosis not present

## 2021-10-12 DIAGNOSIS — E876 Hypokalemia: Secondary | ICD-10-CM | POA: Diagnosis present

## 2021-10-12 DIAGNOSIS — Z885 Allergy status to narcotic agent status: Secondary | ICD-10-CM | POA: Diagnosis not present

## 2021-10-12 DIAGNOSIS — Z79899 Other long term (current) drug therapy: Secondary | ICD-10-CM | POA: Diagnosis not present

## 2021-10-12 DIAGNOSIS — A084 Viral intestinal infection, unspecified: Secondary | ICD-10-CM | POA: Diagnosis present

## 2021-10-12 DIAGNOSIS — Z8249 Family history of ischemic heart disease and other diseases of the circulatory system: Secondary | ICD-10-CM | POA: Diagnosis not present

## 2021-10-12 DIAGNOSIS — I480 Paroxysmal atrial fibrillation: Secondary | ICD-10-CM | POA: Diagnosis present

## 2021-10-12 DIAGNOSIS — I10 Essential (primary) hypertension: Secondary | ICD-10-CM | POA: Diagnosis present

## 2021-10-12 DIAGNOSIS — I4891 Unspecified atrial fibrillation: Secondary | ICD-10-CM | POA: Diagnosis not present

## 2021-10-21 ENCOUNTER — Emergency Department (HOSPITAL_COMMUNITY): Payer: Medicare Other

## 2021-10-21 ENCOUNTER — Encounter (HOSPITAL_COMMUNITY): Payer: Self-pay | Admitting: *Deleted

## 2021-10-21 ENCOUNTER — Inpatient Hospital Stay (HOSPITAL_COMMUNITY)
Admission: EM | Admit: 2021-10-21 | Discharge: 2021-10-26 | DRG: 393 | Disposition: A | Discharge status: Home / Self Care | Payer: Medicare Other | Attending: Internal Medicine | Admitting: Internal Medicine

## 2021-10-21 DIAGNOSIS — E876 Hypokalemia: Secondary | ICD-10-CM | POA: Diagnosis present

## 2021-10-21 DIAGNOSIS — K861 Other chronic pancreatitis: Secondary | ICD-10-CM | POA: Diagnosis present

## 2021-10-21 DIAGNOSIS — K559 Vascular disorder of intestine, unspecified: Secondary | ICD-10-CM | POA: Diagnosis present

## 2021-10-21 DIAGNOSIS — D735 Infarction of spleen: Secondary | ICD-10-CM | POA: Diagnosis present

## 2021-10-21 DIAGNOSIS — K7689 Other specified diseases of liver: Secondary | ICD-10-CM | POA: Diagnosis not present

## 2021-10-21 DIAGNOSIS — Z9841 Cataract extraction status, right eye: Secondary | ICD-10-CM | POA: Diagnosis not present

## 2021-10-21 DIAGNOSIS — R0600 Dyspnea, unspecified: Secondary | ICD-10-CM | POA: Diagnosis not present

## 2021-10-21 DIAGNOSIS — I700 Atherosclerosis of aorta: Secondary | ICD-10-CM | POA: Diagnosis not present

## 2021-10-21 DIAGNOSIS — E669 Obesity, unspecified: Secondary | ICD-10-CM | POA: Diagnosis present

## 2021-10-21 DIAGNOSIS — E440 Moderate protein-calorie malnutrition: Secondary | ICD-10-CM | POA: Diagnosis present

## 2021-10-21 DIAGNOSIS — I482 Chronic atrial fibrillation, unspecified: Principal | ICD-10-CM | POA: Diagnosis present

## 2021-10-21 DIAGNOSIS — J189 Pneumonia, unspecified organism: Secondary | ICD-10-CM | POA: Diagnosis present

## 2021-10-21 DIAGNOSIS — Z6831 Body mass index (BMI) 31.0-31.9, adult: Secondary | ICD-10-CM | POA: Diagnosis not present

## 2021-10-21 DIAGNOSIS — I4891 Unspecified atrial fibrillation: Secondary | ICD-10-CM | POA: Diagnosis not present

## 2021-10-21 DIAGNOSIS — Z87891 Personal history of nicotine dependence: Secondary | ICD-10-CM | POA: Diagnosis not present

## 2021-10-21 DIAGNOSIS — K6389 Other specified diseases of intestine: Secondary | ICD-10-CM | POA: Diagnosis not present

## 2021-10-21 DIAGNOSIS — N1831 Chronic kidney disease, stage 3a: Secondary | ICD-10-CM | POA: Diagnosis not present

## 2021-10-21 DIAGNOSIS — N2889 Other specified disorders of kidney and ureter: Secondary | ICD-10-CM | POA: Diagnosis not present

## 2021-10-21 DIAGNOSIS — R531 Weakness: Secondary | ICD-10-CM | POA: Diagnosis not present

## 2021-10-21 DIAGNOSIS — I8290 Acute embolism and thrombosis of unspecified vein: Secondary | ICD-10-CM | POA: Diagnosis present

## 2021-10-21 DIAGNOSIS — J181 Lobar pneumonia, unspecified organism: Secondary | ICD-10-CM | POA: Diagnosis not present

## 2021-10-21 DIAGNOSIS — K55019 Acute (reversible) ischemia of small intestine, extent unspecified: Principal | ICD-10-CM | POA: Diagnosis present

## 2021-10-21 DIAGNOSIS — Z9842 Cataract extraction status, left eye: Secondary | ICD-10-CM

## 2021-10-21 DIAGNOSIS — Z885 Allergy status to narcotic agent status: Secondary | ICD-10-CM | POA: Diagnosis not present

## 2021-10-21 DIAGNOSIS — Z8619 Personal history of other infectious and parasitic diseases: Secondary | ICD-10-CM

## 2021-10-21 DIAGNOSIS — I272 Pulmonary hypertension, unspecified: Secondary | ICD-10-CM | POA: Diagnosis present

## 2021-10-21 DIAGNOSIS — Z79899 Other long term (current) drug therapy: Secondary | ICD-10-CM

## 2021-10-21 DIAGNOSIS — N390 Urinary tract infection, site not specified: Secondary | ICD-10-CM | POA: Diagnosis not present

## 2021-10-21 DIAGNOSIS — Z20822 Contact with and (suspected) exposure to covid-19: Secondary | ICD-10-CM | POA: Diagnosis present

## 2021-10-21 DIAGNOSIS — B9620 Unspecified Escherichia coli [E. coli] as the cause of diseases classified elsewhere: Secondary | ICD-10-CM | POA: Diagnosis present

## 2021-10-21 DIAGNOSIS — R8281 Pyuria: Secondary | ICD-10-CM

## 2021-10-21 DIAGNOSIS — K55059 Acute (reversible) ischemia of intestine, part and extent unspecified: Secondary | ICD-10-CM | POA: Diagnosis not present

## 2021-10-21 DIAGNOSIS — I129 Hypertensive chronic kidney disease with stage 1 through stage 4 chronic kidney disease, or unspecified chronic kidney disease: Secondary | ICD-10-CM | POA: Diagnosis present

## 2021-10-21 DIAGNOSIS — R059 Cough, unspecified: Secondary | ICD-10-CM | POA: Diagnosis present

## 2021-10-21 DIAGNOSIS — I10 Essential (primary) hypertension: Secondary | ICD-10-CM | POA: Diagnosis present

## 2021-10-21 DIAGNOSIS — R0602 Shortness of breath: Secondary | ICD-10-CM | POA: Diagnosis not present

## 2021-10-21 DIAGNOSIS — R197 Diarrhea, unspecified: Secondary | ICD-10-CM

## 2021-10-21 DIAGNOSIS — I159 Secondary hypertension, unspecified: Secondary | ICD-10-CM | POA: Diagnosis not present

## 2021-10-21 DIAGNOSIS — N179 Acute kidney failure, unspecified: Secondary | ICD-10-CM | POA: Diagnosis present

## 2021-10-21 HISTORY — DX: Unspecified atrial fibrillation: I48.91

## 2021-10-21 LAB — APTT: aPTT: 30 seconds (ref 24–36)

## 2021-10-21 LAB — CBC WITH DIFFERENTIAL/PLATELET
Abs Immature Granulocytes: 0.55 10*3/uL — ABNORMAL HIGH (ref 0.00–0.07)
Basophils Absolute: 0.2 10*3/uL — ABNORMAL HIGH (ref 0.0–0.1)
Basophils Relative: 2 %
Eosinophils Absolute: 0.2 10*3/uL (ref 0.0–0.5)
Eosinophils Relative: 2 %
HCT: 38.9 % (ref 36.0–46.0)
Hemoglobin: 12.6 g/dL (ref 12.0–15.0)
Immature Granulocytes: 5 %
Lymphocytes Relative: 13 %
Lymphs Abs: 1.4 10*3/uL (ref 0.7–4.0)
MCH: 31.3 pg (ref 26.0–34.0)
MCHC: 32.4 g/dL (ref 30.0–36.0)
MCV: 96.8 fL (ref 80.0–100.0)
Monocytes Absolute: 1.2 10*3/uL — ABNORMAL HIGH (ref 0.1–1.0)
Monocytes Relative: 11 %
Neutro Abs: 7.3 10*3/uL (ref 1.7–7.7)
Neutrophils Relative %: 67 %
Platelets: 417 10*3/uL — ABNORMAL HIGH (ref 150–400)
RBC: 4.02 MIL/uL (ref 3.87–5.11)
RDW: 13.2 % (ref 11.5–15.5)
WBC: 10.9 10*3/uL — ABNORMAL HIGH (ref 4.0–10.5)
nRBC: 0 % (ref 0.0–0.2)

## 2021-10-21 LAB — COMPREHENSIVE METABOLIC PANEL
ALT: 36 U/L (ref 0–44)
AST: 30 U/L (ref 15–41)
Albumin: 3 g/dL — ABNORMAL LOW (ref 3.5–5.0)
Alkaline Phosphatase: 67 U/L (ref 38–126)
Anion gap: 9 (ref 5–15)
BUN: 11 mg/dL (ref 8–23)
CO2: 25 mmol/L (ref 22–32)
Calcium: 8 mg/dL — ABNORMAL LOW (ref 8.9–10.3)
Chloride: 102 mmol/L (ref 98–111)
Creatinine, Ser: 1.02 mg/dL — ABNORMAL HIGH (ref 0.44–1.00)
GFR, Estimated: 58 mL/min — ABNORMAL LOW (ref >60)
Glucose, Bld: 105 mg/dL — ABNORMAL HIGH (ref 70–99)
Potassium: 3 mmol/L — ABNORMAL LOW (ref 3.5–5.1)
Sodium: 136 mmol/L (ref 135–145)
Total Bilirubin: 0.6 mg/dL (ref 0.3–1.2)
Total Protein: 6.3 g/dL — ABNORMAL LOW (ref 6.5–8.1)

## 2021-10-21 LAB — URINALYSIS, ROUTINE W REFLEX MICROSCOPIC
Bilirubin Urine: NEGATIVE
Glucose, UA: NEGATIVE mg/dL
Ketones, ur: NEGATIVE mg/dL
Nitrite: POSITIVE — AB
Protein, ur: NEGATIVE mg/dL
Specific Gravity, Urine: 1.005 (ref 1.005–1.030)
pH: 6 (ref 5.0–8.0)

## 2021-10-21 LAB — LIPASE, BLOOD: Lipase: 84 U/L — ABNORMAL HIGH (ref 11–51)

## 2021-10-21 LAB — LACTIC ACID, PLASMA: Lactic Acid, Venous: 0.9 mmol/L (ref 0.5–1.9)

## 2021-10-21 MED ORDER — IOHEXOL 300 MG/ML  SOLN
100.0000 mL | Freq: Once | INTRAMUSCULAR | Status: AC | PRN
  Administered 2021-10-21: 100 mL via INTRAVENOUS

## 2021-10-21 MED ORDER — SODIUM CHLORIDE 0.9 % IV BOLUS
1000.0000 mL | Freq: Once | INTRAVENOUS | Status: AC
  Administered 2021-10-21: 1000 mL via INTRAVENOUS

## 2021-10-21 MED ORDER — ACETAMINOPHEN 325 MG PO TABS
650.0000 mg | ORAL_TABLET | Freq: Four times a day (QID) | ORAL | Status: DC | PRN
  Administered 2021-10-23 (×2): 650 mg via ORAL
  Filled 2021-10-21 (×2): qty 2

## 2021-10-21 MED ORDER — HEPARIN (PORCINE) 25000 UT/250ML-% IV SOLN
1550.0000 [IU]/h | INTRAVENOUS | Status: DC
  Administered 2021-10-21: 1150 [IU]/h via INTRAVENOUS
  Administered 2021-10-22: 1600 [IU]/h via INTRAVENOUS
  Filled 2021-10-21 (×3): qty 250

## 2021-10-21 MED ORDER — FENTANYL CITRATE PF 50 MCG/ML IJ SOSY
12.5000 ug | PREFILLED_SYRINGE | INTRAMUSCULAR | Status: DC | PRN
  Administered 2021-10-22: 50 ug via INTRAVENOUS
  Filled 2021-10-21: qty 1

## 2021-10-21 MED ORDER — ONDANSETRON HCL 4 MG/2ML IJ SOLN
4.0000 mg | Freq: Once | INTRAMUSCULAR | Status: AC
  Administered 2021-10-21: 4 mg via INTRAVENOUS
  Filled 2021-10-21: qty 2

## 2021-10-21 MED ORDER — METOPROLOL TARTRATE 5 MG/5ML IV SOLN
5.0000 mg | INTRAVENOUS | Status: DC | PRN
  Administered 2021-10-23: 5 mg via INTRAVENOUS

## 2021-10-21 MED ORDER — ACETAMINOPHEN 650 MG RE SUPP: 650.0000 mg | Freq: Four times a day (QID) | RECTAL | Status: DC | PRN

## 2021-10-21 MED ORDER — POTASSIUM CHLORIDE IN NACL 40-0.9 MEQ/L-% IV SOLN
INTRAVENOUS | Status: DC
Start: 2021-10-21 — End: 2021-10-24
  Filled 2021-10-21 (×6): qty 1000

## 2021-10-21 MED ORDER — PIPERACILLIN-TAZOBACTAM 3.375 G IVPB
3.3750 g | Freq: Three times a day (TID) | INTRAVENOUS | Status: DC
  Administered 2021-10-21 – 2021-10-25 (×11): 3.375 g via INTRAVENOUS
  Filled 2021-10-21 (×11): qty 50

## 2021-10-21 MED ORDER — GUAIFENESIN-DM 100-10 MG/5ML PO SYRP
5.0000 mL | ORAL_SOLUTION | ORAL | Status: DC | PRN
  Administered 2021-10-23 – 2021-10-26 (×7): 5 mL via ORAL
  Filled 2021-10-21 (×7): qty 5

## 2021-10-21 MED ORDER — BISOPROLOL FUMARATE 5 MG PO TABS
10.0000 mg | ORAL_TABLET | Freq: Every day | ORAL | Status: DC
  Administered 2021-10-21: 10 mg via ORAL
  Filled 2021-10-21: qty 2

## 2021-10-21 MED ORDER — ONDANSETRON HCL 4 MG/2ML IJ SOLN
4.0000 mg | Freq: Four times a day (QID) | INTRAMUSCULAR | Status: DC | PRN
  Administered 2021-10-23: 4 mg via INTRAVENOUS
  Filled 2021-10-21: qty 2

## 2021-10-21 MED ORDER — SODIUM CHLORIDE 0.9 % IV SOLN
1.0000 g | Freq: Once | INTRAVENOUS | Status: AC
  Administered 2021-10-21: 1 g via INTRAVENOUS
  Filled 2021-10-21: qty 10

## 2021-10-21 MED ORDER — PIPERACILLIN-TAZOBACTAM 3.375 G IVPB 30 MIN: 3.3750 g | Freq: Four times a day (QID) | INTRAVENOUS | Status: DC

## 2021-10-21 MED ORDER — ONDANSETRON HCL 4 MG PO TABS
4.0000 mg | ORAL_TABLET | Freq: Four times a day (QID) | ORAL | Status: DC | PRN
  Administered 2021-10-22: 4 mg via ORAL
  Filled 2021-10-21: qty 1

## 2021-10-21 NOTE — Progress Notes (Signed)
ANTICOAGULATION CONSULT NOTE - Initial Consult ? 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 ?Allergies  ?Allergen Reactions  ? Codeine Other (See Comments)  ?  unknown  ? Morphine And Related   ?  Vein became red  ? ? ?Patient Measurements: ?Height: 5' 4.5" (163.8 cm) ?Weight: 83.2 kg (183 lb 8 oz) ?IBW/kg (Calculated) : 55.85 ?Heparin Dosing Weight: 74kg ? ?Vital Signs: ?Temp: 98.6 ?F (37 ?C) (04/10 1354) ?BP: 124/64 (04/10 1900) ?Pulse Rate: 96 (04/10 1900) ? ?Labs: ?Recent Labs  ?  10/21/21 ?1442  ?HGB 12.6  ?HCT 38.9  ?PLT 417*  ?CREATININE 1.02*  ? ? ?Estimated Creatinine Clearance: 51 mL/min (A) (by C-G formula based on SCr of 1.02 mg/dL (H)). ? ? ?Medical History: ?Past Medical History:  ?Diagnosis Date  ? Atrial fibrillation (HCC)   ? Hypertension   ? Pancreatitis   ? Shingles   ? ? ?Medications:  ?(Not in a hospital admission)  ?Scheduled:  ? bisoprolol  10 mg Oral Daily  ? ?Infusions:  ? 0.9 % NaCl with KCl 40 mEq / L    ? ? 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 She has been on apixaban for her afib. She has been apixaban and the last dose was this AM at 0900. We will use PTT for now to monitor until correlation.  ? ?Goal of Therapy:  ?Heparin level 0.3-0.7 units/ml ?aPTT 66-102 seconds ?Monitor platelets by anticoagulation protocol: Yes ?  ?Plan:  ?Start heparin at 1150 units/hr at 2100 ?PTT and heparin level in AM then daily ?Daily CBC ? ?Ulyses Southward, PharmD, BCIDP, AAHIVP, CPP ?Infectious Disease Pharmacist ?10/21/2021 8:44 PM ? ? ? ? ?

## 2021-10-21 NOTE — H&P (Addendum)
?History and Physical  ? ? ?Patient: Shelly Maldonado DOB: 1945-08-06 ?DOA: 10/21/2021 ?DOS: the patient was seen and examined on 10/21/2021 ?PCP: Neale Burly, MD  ?Patient coming from: Home. ?Chief Complaint: Nausea, vomiting, uncontrolled diarrhea, LUQ pain. ?Chief Complaint  ?Patient presents with  ? Diarrhea  ? ?HPI: Shelly Maldonado is a 76 y.o. female with medical history significant of chronic pancreatitis, newly diagnosed atrial fibrillation, HTN and h/o shingles who was recently hospitalized at another facility in [place] x 1 week for UTI with hospital course complicated by new afib.  During that hospital stay, she was noted to have diarrhea which was thought to be antibiotic associated (although Shelly Maldonado tells me she has had the diarrhea for many months). She was scheduled to follow up with a gastroenterologist (for the diarrhea) and a cardiologist (for the afib). She came to the ED because she was unable to tolerate the ongoing diarrhea.  Her sister is at the bedside and reports that Shelly Maldonado has not been eating well and that she has been incontinent of large volumes of liquid stool intermittently. She also has severe intermittent abdominal pain, located in the epigastric area and rated 8-9/10 at worst, associated with restlessness that is alleviated by moving her bowels though the pain relief is short-lived. If she eats or drinks anything, the pain returns and is associated with nausea and dry heaves. She is unable to sleep due to the pain. No associated melena or hematochezia. She has tried Tylenol for the pain, but it has not been helpful.  Opioid pain medications cause her to be "loopy."  Also reports a new cough since her recent hospitalization. No fever, but reports she has "chills all the time." Cough is non-productive. No associated sore throat. Still reporting burning pain with urination.  ? ?In the ED, CT scan of the abdomen and pelvis was done concerning for ischemic  enteritis with thrombus in the SMA. Surgery was consulted and recommended IV heparin and a CL diet, will formally consult in the a.m. Received a dose of Rocephin for suspected ongoing UTI after U/A showed + nitrites and trace leukocytes. Received a 1 L IVF bolus and Zofran as well. ? ?Review of Systems: As mentioned in the history of present illness. All other systems reviewed and are negative. ? ?Past Medical History:  ?Diagnosis Date  ? Atrial fibrillation ([place])   ? Hypertension   ? Pancreatitis   ? Shingles   ? ?Past Surgical History:  ?Procedure Laterality Date  ? CATARACT EXTRACTION Bilateral 2016  ? CHOLECYSTECTOMY    ? COLONOSCOPY N/A 03/04/2018  ? Procedure: COLONOSCOPY;  Surgeon: Rogene Houston, MD;  Location: [facility];  Service: Endoscopy;  Laterality: N/A;  12:45  ? complete hysterecomy    ? ?Social History:  reports that she has quit smoking. She has never used smokeless tobacco. She reports that she does not drink alcohol and does not use drugs. ? ?Allergies  ?Allergen Reactions  ? Codeine Other (See Comments)  ?  unknown  ? Morphine And Related   ?  Vein became red  ? ? ?Family History  ?Problem Relation Age of Onset  ? Colon cancer Neg Hx   ? ? ?Prior to Admission medications   ?Medication Sig Start Date End Date Taking? Authorizing Provider  ?bisoprolol-hydrochlorothiazide (ZIAC) 10-6.25 MG tablet Take 1 tablet by mouth daily.    [provider]  ?DULoxetine (CYMBALTA) 60 MG capsule Take 60 mg by mouth daily.  [provider]  ?gabapentin (NEURONTIN) 300 MG capsule Take 300 mg by mouth 3 (three) times daily.    [provider]  ?ibuprofen (ADVIL,MOTRIN) 200 MG tablet Take 200 mg by mouth every 6 (six) hours as needed for moderate pain.    [provider]  ? ? ?Physical Exam: ?Vitals:  ? 10/21/21 1700 10/21/21 1730 10/21/21 1800 10/21/21 1900  ?BP: 133/85 (!) 147/127 (!) 112/99 124/64  ?Pulse: (!) 107 (!) 109 (!) 111 96  ?Resp: (!) 30 (!) 33 (!) 27 (!) 28   ?Temp:      ?SpO2: 94% 91% 91% 93%  ?Weight:      ?Height:      ? ?General: No acute distress but mildly uncomfortable appearing. ?HEENT: Normocephalic, atraumatic. PERRLA. Dentures upper palate. Fair dentition lower jaw. ?Cardiovascular: Heart sounds are tachycardic and irregular. No gallops or rubs. No murmurs. No JVD. ?Lungs: Decreased breath sounds, course left base. No rales, rhonchi or wheezes. ?Abdomen: Soft, mildly tender LLQ, nondistended with decreased bowel sounds. No masses. No hepatosplenomegaly. ?Neurological: Alert and oriented ?3. Moves all extremities ?4 with equal strength. Cranial nerves II through XII grossly intact. ?Skin: Warm and dry. Scattered ecchymosis. ?Extremities: No clubbing or cyanosis. No edema. Pedal pulses 2+. ?Psychiatric: Mood and affect are normal. Insight and judgment are normal. ? ?Data Reviewed: ? ?Labs/imaging reviewed. CT shows findings concerning for jejunal ischemia ? 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 Check FLP. Surgery consulted, will see in a.m. Lactic acid reassuring at 0.9. We are ruling out C. Diff given recent hospitalization and treatment with antibiotics for UTI. Will place on empiric Zosyn given severity of symptoms and mildly elevated WBC of 10.9.  ? ?Atrial fibrillation with RVR ([place]) ?Acute problem, newly diagnosed but suspect chronic. Will start on IV heparin per pharmacy and use metoprolol PRN for RVR. May need rate control with Cardizem once able to tolerate POs. Check TSH.  ? ? ?HTN (hypertension) ?Has metoprolol PRN.  Will resume bisoprolol but hold the HCTZ since PO intake poor. ? ?Chronic pancreatitis ([place]) ?Has been on Creon in the past, although this isn't listed on med-rec. CL diet for now.  Pain control with low-dose Fentanyl and  anti-emetics PRN. Lipase mildly elevated at 84.  ? ?Hypokalemia ?Add KCL to IVF. Re-check potassium in a.m. ? ?Cough/tachypnea ?Possible Covid exposure in hospital. Check Covid test and CXR. Robitussin DM PRN. CXR did show a wedge-shaped infiltrate in the RUL worrisome for an infiltrate. On empiric Zosyn. ?  ?AKI versus stage III a CKD ?GFR estimated at 58.  Hydrate and monitor.  ? ? Advance Care Planning: Code Status: Full. Would not want prolonged life support. Identifies her sister as being the person she'd want to make health care decisions for her if she could not make them for herself. ? ?Consults: Surgery (Dr. Arnoldo Morale will see in the a.m.) ? ?Family Communication: Sister at bedside. ? ?Severity of Illness: ?The appropriate patient status for this patient is INPATIENT. Inpatient status is judged to be reasonable and necessary in order to provide the required intensity of service to ensure the patient's safety. The patient's presenting symptoms, physical exam findings, and initial radiographic and laboratory data in the context of their chronic comorbidities is felt to place them at high risk for further clinical deterioration. Furthermore, it is not anticipated  that the patient will be medically stable for discharge from the hospital within 2 midnights of admission.  ? ?* I certify that at the point of admission it is my clinical judgment that the patient will require inpatient hospital care spanning beyond 2 midnights from the point of admission due to high intensity of service, high risk for further deterioration and high frequency of surveillance required.* ? ?Author: ?Jacquelynn Cree, MD ?10/21/2021 7:46 PM ? ?For on call review [email or web address].  ?

## 2021-10-21 NOTE — ED Provider Notes (Signed)
?[facility] ?Provider Note ? ? ?CSN: [id number] ?Arrival date & time: 10/21/21  1341 ? ?  ? ?History ? ?Chief Complaint  ?Patient presents with  ? Diarrhea  ? ? ?Shelly Maldonado is a 76 y.o. female with a history of infrequent episodes of pancreatitis of unclear etiology, presenting for evaluation of nausea vomiting and diarrhea.  She describes being diagnosed with a UTI several weeks ago after which she developed abdominal pain along with nausea vomiting and diarrhea.  She was admitted for a week at the hospital in [facility] during which time she was diagnosed with new onset atrial fibrillation and was told she has a "infectious diarrhea".  However she was not placed on antibiotics for this but has been referred to a gastroenterologist.  She was also placed on medications including amlodipine, metoprolol and Eliquis for her new onset A-fib.  She is scheduled to see a cardiologist in [place] on April 19.  She denies chest pain, palpitations but does endorse shortness of breath with exertion since this new A-fib diagnosis.  This appears to be stable today.  Her main concern is for generalized weakness, reporting left upper quadrant abdominal pain along with nausea with dry heaves and frequent episodes of bloody watery diarrhea.  She reports at least 6 episodes of diarrhea in the past 24 hours, 3 of them waking her up last night from sleep.  She is seeing no blood in her stool.  She continues to have left upper quadrant pain despite being told her pancreatitis was gone with her recent hospitalization.  She was told to hold her chronic Creon since that admission.  She has been taking Pepto-Bismol with no improvement in her diarrhea.  She denies fevers but does report frequently feeling chilled.  She also endorses burning pain with urination but denies increased frequency or urgency.  This symptom had resolved with her recent hospitalization but has returned since being discharged  home. ? ?The history is provided by the patient and a relative.  ? ?  ? ?Home Medications ?Prior to Admission medications   ?Medication Sig Start Date End Date Taking? Authorizing Provider  ?bisoprolol-hydrochlorothiazide (ZIAC) 10-6.25 MG tablet Take 1 tablet by mouth daily.    [provider]  ?DULoxetine (CYMBALTA) 60 MG capsule Take 60 mg by mouth daily.    [provider]  ?gabapentin (NEURONTIN) 300 MG capsule Take 300 mg by mouth 3 (three) times daily.    [provider]  ?ibuprofen (ADVIL,MOTRIN) 200 MG tablet Take 200 mg by mouth every 6 (six) hours as needed for moderate pain.    [provider]  ?   ? ?Allergies    ?Codeine and Morphine and related   ? ?Review of Systems   ?Review of Systems  ?Constitutional:  Positive for chills and fatigue. Negative for fever.  ?HENT:  Negative for congestion and sore throat.   ?Eyes: Negative.   ?Respiratory:  Negative for chest tightness and shortness of breath.   ?Cardiovascular:  Negative for chest pain.  ?Gastrointestinal:  Positive for abdominal pain, diarrhea, nausea and vomiting.  ?Genitourinary:  Positive for dysuria. Negative for flank pain and urgency.  ?Musculoskeletal:  Negative for arthralgias, joint swelling and neck pain.  ?Skin: Negative.  Negative for rash and wound.  ?Neurological:  Negative for dizziness, weakness, light-headedness, numbness and headaches.  ?Psychiatric/Behavioral: Negative.    ?All other systems reviewed and are negative. ? ?Physical Exam ?Updated Vital Signs ?BP (!) 112/99   Pulse (!) 111  Temp 98.6 ?F (37 ?C)   Resp (!) 27   Ht 5' 4.5" (1.638 m)   Wt 83.2 kg   SpO2 91%   BMI 31.01 kg/m?  ?Physical Exam ?Vitals and nursing note reviewed.  ?Constitutional:   ?   Appearance: She is well-developed.  ?HENT:  ?   Head: Normocephalic and atraumatic.  ?Eyes:  ?   Conjunctiva/sclera: Conjunctivae normal.  ?Cardiovascular:  ?   Rate and Rhythm: Normal rate and regular rhythm.  ?   Heart sounds:  Normal heart sounds.  ?Pulmonary:  ?   Effort: Pulmonary effort is normal.  ?   Breath sounds: Normal breath sounds. No wheezing.  ?Abdominal:  ?   General: Bowel sounds are normal.  ?   Palpations: Abdomen is soft.  ?   Tenderness: There is abdominal tenderness in the left upper quadrant.  ?Musculoskeletal:     ?   General: Normal range of motion.  ?   Cervical back: Normal range of motion.  ?Skin: ?   General: Skin is warm and dry.  ?Neurological:  ?   Mental Status: She is alert.  ? ? ?ED Results / Procedures / Treatments   ?Labs ?(all labs ordered are listed, but only abnormal results are displayed) ?Labs Reviewed  ?CBC WITH DIFFERENTIAL/PLATELET - Abnormal; Notable for the following components:  ?    Result Value  ? WBC 10.9 (*)   ? Platelets 417 (*)   ? Monocytes Absolute 1.2 (*)   ? Basophils Absolute 0.2 (*)   ? Abs Immature Granulocytes 0.55 (*)   ? All other components within normal limits  ?COMPREHENSIVE METABOLIC PANEL - Abnormal; Notable for the following components:  ? Potassium 3.0 (*)   ? Glucose, Bld 105 (*)   ? Creatinine, Ser 1.02 (*)   ? Calcium 8.0 (*)   ? Total Protein 6.3 (*)   ? Albumin 3.0 (*)   ? GFR, Estimated 58 (*)   ? All other components within normal limits  ?LIPASE, BLOOD - Abnormal; Notable for the following components:  ? Lipase 84 (*)   ? All other components within normal limits  ?URINALYSIS, ROUTINE W REFLEX MICROSCOPIC - Abnormal; Notable for the following components:  ? Hgb urine dipstick SMALL (*)   ? Nitrite POSITIVE (*)   ? Leukocytes,Ua TRACE (*)   ? Bacteria, UA RARE (*)   ? All other components within normal limits  ?C DIFFICILE QUICK SCREEN W PCR REFLEX    ?GASTROINTESTINAL PANEL BY PCR, STOOL (REPLACES STOOL CULTURE)  ?URINE CULTURE  ?LACTIC ACID, PLASMA  ? ? ?EKG ?None ? ?Radiology ?CT ABDOMEN PELVIS W CONTRAST ? ?Result Date: 10/21/2021 ?CLINICAL DATA:  Left upper quadrant abdominal pain. Nausea, vomiting, and diarrhea for 3 days. EXAM: CT ABDOMEN AND PELVIS WITH  CONTRAST TECHNIQUE: Multidetector CT imaging of the abdomen and pelvis was performed using the standard protocol following bolus administration of intravenous contrast. RADIATION DOSE REDUCTION: This exam was performed according to the departmental dose-optimization program which includes automated exposure control, adjustment of the mA and/or kV according to patient size and/or use of iterative reconstruction technique. CONTRAST:  123mL OMNIPAQUE IOHEXOL 300 MG/ML  SOLN COMPARISON:  02/04/2018 FINDINGS: Lower chest: Small left pleural effusion. Trace right pleural fluid. Borderline cardiomegaly. Descending thoracic aortic atherosclerosis. Small type 1 hiatal hernia. Hepatobiliary: Cholecystectomy. 5 by 3 mm hypodense lesion in the right hepatic lobe on image 11 of series 2 is faintly appreciable on 02/04/2018 and highly likely to be benign/incidental.  Pancreas: Unremarkable Spleen: 1.3 by 1.0 by 1.3 cm hypodensity posteriorly in the spleen is new compared to 02/04/2018, and technically nonspecific, but in conjunction with other findings in the abdomen this is probably a small splenic infarct. Adrenals/Urinary Tract: Small hypodense renal lesions in the 5 mm diameter range are present for example on image 20 on the right and image 20 on the left, highly likely to be small benign cysts or similar benign lesions although technically nonspecific due to small size. A 1.0 by 0.7 cm exophytic fluid density or near fluid density lesion from the left kidney upper pole on image 84 series 6 is similar to the 02/04/2018 exam and most compatible with a benign cyst. In general, such lesions are not felt to warrant further workup unless there is a specific renal clinical concern. The adrenal glands appear normal. The urinary bladder appears normal. Stomach/Bowel: There is abnormal wall thickening and mildly accentuated mesenteric stranding along loops of left upper quadrant jejunum compatible with inflammation. No pneumatosis or  extraluminal gas. No visible abscess. No overtly dilated jejunal loops (although 1 loop is upper normal diameter in the left upper quadrant on image 25 series 2). I do not see any twisting of the bowel or swirling of

## 2021-10-21 NOTE — ED Triage Notes (Signed)
Abdominal pain with vomiting and diarrhea

## 2021-10-22 ENCOUNTER — Encounter (HOSPITAL_COMMUNITY): Payer: Self-pay | Admitting: Internal Medicine

## 2021-10-22 ENCOUNTER — Inpatient Hospital Stay (HOSPITAL_COMMUNITY): Payer: Medicare Other

## 2021-10-22 ENCOUNTER — Other Ambulatory Visit: Payer: Self-pay

## 2021-10-22 DIAGNOSIS — B9620 Unspecified Escherichia coli [E. coli] as the cause of diseases classified elsewhere: Secondary | ICD-10-CM | POA: Insufficient documentation

## 2021-10-22 DIAGNOSIS — D735 Infarction of spleen: Secondary | ICD-10-CM

## 2021-10-22 DIAGNOSIS — E876 Hypokalemia: Secondary | ICD-10-CM

## 2021-10-22 DIAGNOSIS — N390 Urinary tract infection, site not specified: Secondary | ICD-10-CM | POA: Insufficient documentation

## 2021-10-22 DIAGNOSIS — K861 Other chronic pancreatitis: Secondary | ICD-10-CM

## 2021-10-22 DIAGNOSIS — R8281 Pyuria: Secondary | ICD-10-CM

## 2021-10-22 DIAGNOSIS — I4891 Unspecified atrial fibrillation: Secondary | ICD-10-CM | POA: Diagnosis not present

## 2021-10-22 DIAGNOSIS — I482 Chronic atrial fibrillation, unspecified: Secondary | ICD-10-CM

## 2021-10-22 DIAGNOSIS — J181 Lobar pneumonia, unspecified organism: Secondary | ICD-10-CM

## 2021-10-22 DIAGNOSIS — N1831 Chronic kidney disease, stage 3a: Secondary | ICD-10-CM

## 2021-10-22 DIAGNOSIS — K559 Vascular disorder of intestine, unspecified: Secondary | ICD-10-CM | POA: Diagnosis not present

## 2021-10-22 LAB — LIPID PANEL
Cholesterol: 101 mg/dL (ref 0–200)
HDL: 30 mg/dL — ABNORMAL LOW (ref >40)
LDL Cholesterol: 54 mg/dL (ref 0–99)
Total CHOL/HDL Ratio: 3.4 RATIO
Triglycerides: 83 mg/dL (ref <150)
VLDL: 17 mg/dL (ref 0–40)

## 2021-10-22 LAB — ECHOCARDIOGRAM COMPLETE
AR max vel: 2.43 cm2
AV Area VTI: 2.41 cm2
AV Area mean vel: 2.42 cm2
AV Mean grad: 2 mmHg
AV Peak grad: 3.5 mmHg
Ao pk vel: 0.94 m/s
Area-P 1/2: 4.12 cm2
Height: 64.5 in
MV VTI: 2.01 cm2
S' Lateral: 2.8 cm
Weight: 2936 oz

## 2021-10-22 LAB — MRSA NEXT GEN BY PCR, NASAL: MRSA by PCR Next Gen: NOT DETECTED

## 2021-10-22 LAB — APTT
aPTT: 30 seconds (ref 24–36)
aPTT: 35 seconds (ref 24–36)

## 2021-10-22 LAB — CBC
HCT: 35.9 % — ABNORMAL LOW (ref 36.0–46.0)
Hemoglobin: 11.5 g/dL — ABNORMAL LOW (ref 12.0–15.0)
MCH: 31.6 pg (ref 26.0–34.0)
MCHC: 32 g/dL (ref 30.0–36.0)
MCV: 98.6 fL (ref 80.0–100.0)
Platelets: 385 10*3/uL (ref 150–400)
RBC: 3.64 MIL/uL — ABNORMAL LOW (ref 3.87–5.11)
RDW: 13.2 % (ref 11.5–15.5)
WBC: 10 10*3/uL (ref 4.0–10.5)
nRBC: 0 % (ref 0.0–0.2)

## 2021-10-22 LAB — HEPARIN LEVEL (UNFRACTIONATED)
Heparin Unfractionated: >1.1 IU/mL — ABNORMAL HIGH (ref 0.30–0.70)
Heparin Unfractionated: >1.1 IU/mL — ABNORMAL HIGH (ref 0.30–0.70)

## 2021-10-22 LAB — PROCALCITONIN: Procalcitonin: <0.1 ng/mL

## 2021-10-22 LAB — POC SARS CORONAVIRUS 2 AG -  ED: SARSCOV2ONAVIRUS 2 AG: NEGATIVE

## 2021-10-22 LAB — TSH: TSH: 6.342 u[IU]/mL — ABNORMAL HIGH (ref 0.350–4.500)

## 2021-10-22 MED ORDER — HYDROXYZINE HCL 10 MG/5ML PO SYRP
10.0000 mg | ORAL_SOLUTION | Freq: Once | ORAL | Status: DC
  Filled 2021-10-22: qty 5

## 2021-10-22 MED ORDER — CHLORHEXIDINE GLUCONATE CLOTH 2 % EX PADS
6.0000 | MEDICATED_PAD | Freq: Every day | CUTANEOUS | Status: DC
  Administered 2021-10-22 – 2021-10-26 (×4): 6 via TOPICAL

## 2021-10-22 MED ORDER — IPRATROPIUM-ALBUTEROL 0.5-2.5 (3) MG/3ML IN SOLN
3.0000 mL | Freq: Four times a day (QID) | RESPIRATORY_TRACT | Status: DC
  Administered 2021-10-22 – 2021-10-23 (×4): 3 mL via RESPIRATORY_TRACT
  Filled 2021-10-22 (×4): qty 3

## 2021-10-22 MED ORDER — BUDESONIDE 0.5 MG/2ML IN SUSP
0.5000 mg | Freq: Two times a day (BID) | RESPIRATORY_TRACT | Status: DC
Start: 2021-10-22 — End: 2021-10-26
  Administered 2021-10-22 – 2021-10-26 (×9): 0.5 mg via RESPIRATORY_TRACT
  Filled 2021-10-22 (×9): qty 2

## 2021-10-22 MED ORDER — METOPROLOL TARTRATE 5 MG/5ML IV SOLN
2.5000 mg | Freq: Four times a day (QID) | INTRAVENOUS | Status: DC
  Administered 2021-10-22 – 2021-10-23 (×4): 2.5 mg via INTRAVENOUS
  Filled 2021-10-22 (×5): qty 5

## 2021-10-22 NOTE — Progress Notes (Signed)
?  Transition of Care (TOC) Screening Note ? ? ?Patient Details  ?Name: Shelly Maldonado ?Date of Birth: 1946/02/21 ? ? ?Transition of Care (TOC) CM/SW Contact:    ?Villa Herb, LCSWA ?Phone Number: ?10/22/2021, 10:19 AM ? ? ? ?Transition of Care Department [facility]) has reviewed patient and no TOC needs have been identified at this time. We will continue to monitor patient advancement through interdisciplinary progression rounds. If new patient transition needs arise, please place a TOC consult. ?  ?

## 2021-10-22 NOTE — Assessment & Plan Note (Signed)
Baseline creatinine 0.8-1.0 ?

## 2021-10-22 NOTE — Assessment & Plan Note (Signed)
Replete Check magnesium 

## 2021-10-22 NOTE — Progress Notes (Signed)
ANTICOAGULATION CONSULT NOTE  ? 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 ?Allergies  ?Allergen Reactions  ? Codeine Other (See Comments)  ?  unknown  ? Morphine And Related   ?  Vein became red  ? ? ?Patient Measurements: ?Height: 5' 4.5" (163.8 cm) ?Weight: 83.2 kg (183 lb 8 oz) ?IBW/kg (Calculated) : 55.85 ?Heparin Dosing Weight: 74kg ? ?Vital Signs: ?BP: 118/76 (04/11 0405) ?Pulse Rate: 105 (04/11 0405) ? ?Labs: ?Recent Labs  ?  10/21/21 ?1442 10/21/21 ?2120 10/22/21 ?0351  ?HGB 12.6  --  11.5*  ?HCT 38.9  --  35.9*  ?PLT 417*  --  385  ?APTT  --  30 30  ?HEPARINUNFRC  --   --  >1.10*  ?CREATININE 1.02*  --   --   ? ? ? ?Estimated Creatinine Clearance: 51 mL/min (A) (by C-G formula based on SCr of 1.02 mg/dL (H)). ? ? ?Medical History: ?Past Medical History:  ?Diagnosis Date  ? Atrial fibrillation ([place])   ? Hypertension   ? Pancreatitis   ? Shingles   ? ? ?Medications:  ?(Not in a hospital admission) ?Scheduled:  ? bisoprolol  10 mg Oral Daily  ? ?Infusions:  ? 0.9 % NaCl with KCl 40 mEq / L 100 mL/hr at 10/21/21 2226  ? heparin 1,150 Units/hr (10/21/21 2221)  ? piperacillin-tazobactam (ZOSYN)  IV Stopped (10/22/21 0255)  ? ? 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 She has been on apixaban for her afib. She has been apixaban and the last dose was this AM at 0900. We will use PTT for now to monitor until correlation.  ? ?4/11 AM update:  ?aPTT low ? ?Goal of Therapy:  ?Heparin level 0.3-0.7 units/ml ?aPTT 66-102 seconds ?Monitor platelets by anticoagulation protocol: Yes ?  ?Plan:  ?Inc heparin to 1350 units/hr ?1400 heparin level and aPTT ? ?Narda Bonds, PharmD, BCPS ?Clinical Pharmacist ?Phone: [phone] ? ? ? ?

## 2021-10-22 NOTE — Assessment & Plan Note (Signed)
She also has dysuria ?-11-20 WBC on UA ?-follow urine culture ?-already on zosyn ?

## 2021-10-22 NOTE — Progress Notes (Signed)
ANTICOAGULATION CONSULT NOTE  ? 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 ?Allergies  ?Allergen Reactions  ? Codeine Other (See Comments)  ?  unknown  ? Morphine And Related   ?  Vein became red  ? ? ?Patient Measurements: ?Height: 5' 4.5" (163.8 cm) ?Weight: 83.2 kg (183 lb 8 oz) ?IBW/kg (Calculated) : 55.85 ?Heparin Dosing Weight: 74kg ? ?Vital Signs: ?Temp: 98.8 ?F (37.1 ?C) (04/11 1256) ?Temp Source: Oral (04/11 1256) ?BP: 149/93 (04/11 1256) ?Pulse Rate: 100 (04/11 1256) ? ?Labs: ?Recent Labs  ?  10/21/21 ?1442 10/21/21 ?2120 10/22/21 ?0351 10/22/21 ?1359  ?HGB 12.6  --  11.5*  --   ?HCT 38.9  --  35.9*  --   ?PLT 417*  --  385  --   ?APTT  --  30 30 35  ?HEPARINUNFRC  --   --  >1.10* >1.10*  ?CREATININE 1.02*  --   --   --   ? ? ? ?Estimated Creatinine Clearance: 51 mL/min (A) (by C-G formula based on SCr of 1.02 mg/dL (H)). ? ? ?Medical History: ?Past Medical History:  ?Diagnosis Date  ? Atrial fibrillation ([facility])   ? Hypertension   ? Pancreatitis   ? Shingles   ? ? ?Medications:  ?Medications Prior to Admission  ?Medication Sig Dispense Refill Last Dose  ? amLODipine (NORVASC) 5 MG tablet Take 5 mg by mouth daily.   10/21/2021  ? bisoprolol-hydrochlorothiazide (ZIAC) 10-6.25 MG tablet Take 1 tablet by mouth daily.   10/21/2021 at 0800  ? ELIQUIS 5 MG TABS tablet Take 5 mg by mouth 2 (two) times daily.   10/21/2021 at 0800  ? ibuprofen (ADVIL,MOTRIN) 200 MG tablet Take 200 mg by mouth every 6 (six) hours as needed for moderate pain.   unk  ? levocetirizine (XYZAL) 5 MG tablet Take 5 mg by mouth at bedtime.   Past Week  ? metoprolol tartrate (LOPRESSOR) 25 MG tablet Take 25 mg by mouth 2 (two) times daily.   10/21/2021 at 0800  ? CREON 36000-114000 units CPEP capsule Take 1 capsule by mouth 3 (three) times daily before meals. (Patient not taking: Reported on 10/22/2021)   Not Taking  ? ?Scheduled:  ? budesonide (PULMICORT) nebulizer solution  0.5 mg  Nebulization BID  ? Chlorhexidine Gluconate Cloth  6 each Topical Daily  ? ipratropium-albuterol  3 mL Nebulization Q6H  ? metoprolol tartrate  2.5 mg Intravenous Q6H  ? ?Infusions:  ? 0.9 % NaCl with KCl 40 mEq / L 100 mL/hr at 10/22/21 1110  ? heparin 1,350 Units/hr (10/22/21 1443)  ? piperacillin-tazobactam (ZOSYN)  IV 3.375 g (10/22/21 1438)  ? ? 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 She has been on apixaban for her afib. She has been apixaban and the last dose was this AM at 0900. We will use PTT for now to monitor until correlation.  ? ?aPTT = 35 , still subtherapeutic. No issues with heparin infusion per RN ? ?Goal of Therapy:  ?Heparin level 0.3-0.7 units/ml ?aPTT 66-102 seconds ?Monitor platelets by anticoagulation protocol: Yes ?  ?Plan:  ?Inc heparin infusion to 1600 units/hr ?Recheck aPTT in ~8 hours  ?aPTT and heparin level daily ?Daily CBC ? ?Elder Cyphers, BS Pharm D, BCPS ?Clinical Pharmacist ? ? ? ? ?

## 2021-10-22 NOTE — Assessment & Plan Note (Signed)
Continue Creon the patient is able to tolerate p.o. ?

## 2021-10-22 NOTE — Assessment & Plan Note (Signed)
Start metoprolol IV in lieu of bisoprolol (PTA med) ?

## 2021-10-22 NOTE — Progress Notes (Signed)
*  PRELIMINARY RESULTS* ?Echocardiogram ?2D Echocardiogram has been performed. ? ?Shelly Maldonado ?10/22/2021, 10:37 AM ?

## 2021-10-22 NOTE — Assessment & Plan Note (Signed)
Personally reviewed CXR--RUL opacity ?-continuing zosyn ?-MRSA screen ?-check PCT ?

## 2021-10-22 NOTE — Assessment & Plan Note (Addendum)
Etiology is atrial fibrillation ?-check Factor V Leiden, anticardiolipin antibody, prothrombin gene mutation ?-Continue IV heparin ?-Judicious opioids ?

## 2021-10-22 NOTE — Hospital Course (Signed)
76 year old female with a history of atrial fibrillation, chronic pancreatitis, hypertension, newly diagnosed atrial fibrillation presenting with upper abdominal pain, nausea, and loose stools worsening over the past week.  Notably, the patient had a recent hospitalization in [place] from 10/11/2021 to 10/16/2021.  She was treated for her UTI,  and during that stay she developed new onset atrial fibrillation for which she was started on metoprolol 25 mg twice daily and apixaban.  During that hospital stay, the patient was noted to have diarrhea which was thought to be antibiotic associated, although the patient states that she has had loose stools and abdominal pain intermittently over the last 6 months which has worsened over the past week.  Since discharge from [facility], the patient has had some subjective fevers and chills.  She has also developed a nonproductive cough with worsening shortness of breath.  There is no hemoptysis.  She was scheduled to follow up with a gastroenterologist (for the diarrhea) and a cardiologist (for the afib). She came to the ED because she was unable to tolerate the ongoing diarrhea.  Her sister is at the bedside and reports that Mrs. Drescher has not been eating well and that she has been incontinent of large volumes of liquid stool intermittently. She also has severe intermittent abdominal pain, located in the epigastric area and rated 8-9/10 at worst, she states that eating makes her abdominal pain worse.  She continues to have nausea with intermittent dry heaving. ?In the ED, the patient was afebrile and hemodynamically stable with oxygen saturation 95% room air.  She was tachycardic with heart rate 100-115.  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  There is also a small acute splenic infarct.  The patient  was started on IV heparin and Zosyn.  General surgery was consulted to assist with management. ?

## 2021-10-22 NOTE — Consult Note (Signed)
Reason for Consult: Ischemic enteritis and splenic infarct ?Referring Physician: Dr. Arbutus Leas ? ?Shelly Maldonado is an 76 y.o. female.  ?HPI: Patient is a 76 year old white female with a history of newly diagnosed atrial fibrillation, hypertension, diarrhea, and chronic pancreatitis who recently was discharged from a hospital in [facility] after being treated for UTI.  She apparently had returned to the emergency room there and was diagnosed with atrial fibrillation which was new onset in nature.  She was started on Eliquis.  She was supposed to follow-up with both a gastroenterologist and cardiologist but presented to the emergency room due to worsening diarrhea.  She also had intermittent upper abdominal pain.  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  She last took her Eliquis the day that she presented to the emergency room.  She was started on a clear liquid diet.  She did receive Rocephin for an ongoing UTI. ?This morning, she states her abdominal pain has eased somewhat.  She does have some intermittent left upper quadrant abdominal pain.  She has not had any diarrhea since her admission.  She denies any fever or chills. ? ?Past Medical History:  ?Diagnosis Date  ? Atrial fibrillation (HCC)   ? Hypertension   ? Pancreatitis   ? Shingles   ? ? ?Past Surgical History:  ?Procedure Laterality Date  ? CATARACT EXTRACTION Bilateral 2016  ? CHOLECYSTECTOMY    ? COLONOSCOPY N/A 03/04/2018  ? Procedure: COLONOSCOPY;  Surgeon: Malissa Hippo, MD;  Location: [facility];  Service: Endoscopy;  Laterality: N/A;  12:45  ? complete hysterecomy    ? ? ?Family History  ?Problem Relation Age of Onset  ? Colon cancer Neg Hx   ? ? ?Social History:  reports that she has quit smoking. She has never used smokeless tobacco. She reports that she does not drink alcohol and does not use drugs. ? ?Allergies:  ?Allergies   ?Allergen Reactions  ? Codeine Other (See Comments)  ?  unknown  ? Morphine And Related   ?  Vein became red  ? ? ?Medications: I have reviewed the patient's current medications. ? ?Results for orders placed or performed during the hospital encounter of 10/21/21 (from the past 48 hour(s))  ?CBC with Differential     Status: Abnormal  ? Collection Time: 10/21/21  2:42 PM  ?Result Value Ref Range  ? WBC 10.9 (H) 4.0 - 10.5 K/uL  ? RBC 4.02 3.87 - 5.11 MIL/uL  ? Hemoglobin 12.6 12.0 - 15.0 g/dL  ? HCT 38.9 36.0 - 46.0 %  ? MCV 96.8 80.0 - 100.0 fL  ? MCH 31.3 26.0 - 34.0 pg  ? MCHC 32.4 30.0 - 36.0 g/dL  ? RDW 13.2 11.5 - 15.5 %  ? Platelets 417 (H) 150 - 400 K/uL  ? nRBC 0.0 0.0 - 0.2 %  ? Neutrophils Relative % 67 %  ? Neutro Abs 7.3 1.7 - 7.7 K/uL  ? Lymphocytes Relative 13 %  ? Lymphs Abs 1.4 0.7 - 4.0 K/uL  ? Monocytes Relative 11 %  ? Monocytes Absolute 1.2 (H) 0.1 - 1.0 K/uL  ? Eosinophils Relative 2 %  ? Eosinophils Absolute 0.2 0.0 - 0.5 K/uL  ? Basophils Relative 2 %  ? Basophils Absolute 0.2 (H) 0.0 - 0.1 K/uL  ? Immature Granulocytes 5 %  ? Abs Immature Granulocytes 0.55 (H) 0.00 - 0.07 K/uL  ?  Comment: Performed at [facility], [address] [place], [place] [address]  ?Comprehensive metabolic panel     Status: Abnormal  ? Collection Time: 10/21/21  2:42 PM  ?Result Value Ref Range  ? Sodium 136 135 - 145 mmol/L  ? Potassium 3.0 (L) 3.5 - 5.1 mmol/L  ? Chloride 102 98 - 111 mmol/L  ? CO2 25 22 - 32 mmol/L  ? Glucose, Bld 105 (H) 70 - 99 mg/dL  ?  Comment: Glucose reference range applies only to samples taken after fasting for at least 8 hours.  ? BUN 11 8 - 23 mg/dL  ? Creatinine, Ser 1.02 (H) 0.44 - 1.00 mg/dL  ? Calcium 8.0 (L) 8.9 - 10.3 mg/dL  ? Total Protein 6.3 (L) 6.5 - 8.1 g/dL  ? Albumin 3.0 (L) 3.5 - 5.0 g/dL  ? AST 30 15 - 41 U/L  ? ALT 36 0 - 44 U/L  ? Alkaline Phosphatase 67 38 - 126 U/L  ? Total Bilirubin 0.6 0.3 - 1.2 mg/dL  ? GFR, Estimated 58 (L) >60 mL/min  ?  Comment:  (NOTE) ?Calculated using the CKD-EPI Creatinine Equation (2021) ?  ? Anion gap 9 5 - 15  ?  Comment: Performed at [facility], [address] [place], [place] [address]  ?Lipase, blood     Status: Abnormal  ? Collection Time: 10/21/21  2:42 PM  ?Result Value Ref Range  ? Lipase 84 (H) 11 - 51 U/L  ?  Comment: Performed at [facility], [address] [place], [place] [address]  ?Urinalysis, Routine w reflex microscopic Urine, Catheterized     Status: Abnormal  ? Collection Time: 10/21/21  3:47 PM  ?Result Value Ref Range  ? Color, Urine YELLOW YELLOW  ? APPearance CLEAR CLEAR  ? Specific Gravity, Urine 1.005 1.005 - 1.030  ? pH 6.0 5.0 - 8.0  ? Glucose, UA NEGATIVE NEGATIVE mg/dL  ? Hgb urine dipstick SMALL (A) NEGATIVE  ? Bilirubin Urine NEGATIVE NEGATIVE  ? Ketones, ur NEGATIVE NEGATIVE mg/dL  ? Protein, ur NEGATIVE NEGATIVE mg/dL  ? Nitrite POSITIVE (A) NEGATIVE  ? Leukocytes,Ua TRACE (A) NEGATIVE  ? RBC / HPF 0-5 0 - 5 RBC/hpf  ? WBC, UA 11-20 0 - 5 WBC/hpf  ? Bacteria, UA RARE (A) NONE SEEN  ? Squamous Epithelial / LPF 0-5 0 - 5  ?  Comment: Performed at [facility], [address] [place], [place] [address]  ?Lactic acid, plasma     Status: None  ? Collection Time: 10/21/21  7:09 PM  ?Result Value Ref Range  ? Lactic Acid, Venous 0.9 0.5 - 1.9 mmol/L  ?  Comment: Performed at [facility], [address] [place], [place] [address]  ?APTT     Status: None  ? Collection Time: 10/21/21  9:20 PM  ?Result Value Ref Range  ? aPTT 30 24 - 36 seconds  ?  Comment: Performed at [facility], [address] [place], [place] [address]  ?TSH     Status: Abnormal  ? Collection Time: 10/22/21  3:51 AM  ?Result Value Ref Range  ? TSH 6.342 (H) 0.350 - 4.500 uIU/mL  ?  Comment: Performed by a 3rd Generation assay with a functional sensitivity of <=0.01 uIU/mL. ?Performed at [facility], [address] [place], [place] [address] ?  ?APTT     Status: None  ? Collection Time: 10/22/21  3:51 AM  ?Result Value Ref Range  ? aPTT  30 24 -  36 seconds  ?  Comment: Performed at [facility], [address] [place], [place] [address]  ?Heparin level (unfractionated)     Status: Abnormal  ? Collection Time: 10/22/21  3:51 AM  ?Result Value Ref Range  ? Heparin Unfractionated >1.10 (H) 0.30 - 0.70 IU/mL  ?  Comment: (NOTE) ?The clinical reportable range upper limit is being lowered to >1.10 ?to align with the FDA approved guidance for the current laboratory ?assay. ? ?If heparin results are below expected values, and patient dosage has  ?been confirmed, suggest follow up testing of antithrombin III levels. ?Performed at [facility], [address], [place], [place] [address] ?  ?CBC     Status: Abnormal  ? Collection Time: 10/22/21  3:51 AM  ?Result Value Ref Range  ? WBC 10.0 4.0 - 10.5 K/uL  ? RBC 3.64 (L) 3.87 - 5.11 MIL/uL  ? Hemoglobin 11.5 (L) 12.0 - 15.0 g/dL  ? HCT 35.9 (L) 36.0 - 46.0 %  ? MCV 98.6 80.0 - 100.0 fL  ? MCH 31.6 26.0 - 34.0 pg  ? MCHC 32.0 30.0 - 36.0 g/dL  ? RDW 13.2 11.5 - 15.5 %  ? Platelets 385 150 - 400 K/uL  ? nRBC 0.0 0.0 - 0.2 %  ?  Comment: Performed at [facility], [address] [place], [place] [address]  ?Lipid panel     Status: Abnormal  ? Collection Time: 10/22/21  3:51 AM  ?Result Value Ref Range  ? Cholesterol 101 0 - 200 mg/dL  ? Triglycerides 83 <150 mg/dL  ? HDL 30 (L) >40 mg/dL  ? Total CHOL/HDL Ratio 3.4 RATIO  ? VLDL 17 0 - 40 mg/dL  ? LDL Cholesterol 54 0 - 99 mg/dL  ?  Comment:        ?Total Cholesterol/HDL:CHD Risk ?Coronary Heart Disease Risk Table ?                    Men   Women ? 1/2 Average Risk   3.4   3.3 ? Average Risk       5.0   4.4 ? 2 X Average Risk   9.6   7.1 ? 3 X Average Risk  23.4   11.0 ?       ?Use the calculated Patient Ratio ?above and the CHD Risk Table ?to determine the patient's CHD Risk. ?       ?ATP III CLASSIFICATION (LDL): ? <100     mg/dL   Optimal ? 546-568  mg/dL   Near or Above ?                   Optimal ? 130-159  mg/dL   Borderline ? 160-189  mg/dL   High ? >127      mg/dL   Very High ?Performed at [facility], [address], [place], [place] [address] ?  ?POC SARS Coronavirus 2 Ag-ED - Nasal Swab     Status: None  ? Collection Time: 10/22/21  5:57 AM  ?Result Value Ref Range  ? SA

## 2021-10-22 NOTE — Assessment & Plan Note (Signed)
Switch bisoprolol to metoprolol heart rate for rate control titration for her atrial fibrillation ?

## 2021-10-22 NOTE — Progress Notes (Signed)
?  ?       ?PROGRESS NOTE ? ?Shelly Maldonado [id number] DOB: [birth year] DOA: 10/21/2021 ?PCP: Shelly Burly, MD ? ?Brief History:  ?76 year old female with a history of atrial fibrillation, chronic pancreatitis, hypertension, newly diagnosed atrial fibrillation presenting with upper abdominal pain, nausea, and loose stools worsening over the past week.  Notably, the patient had a recent hospitalization in [place] from 10/11/2021 to 10/16/2021.  She was treated for her UTI,  and during that stay she developed new onset atrial fibrillation for which she was started on metoprolol 25 mg twice daily and apixaban.  During that hospital stay, the patient was noted to have diarrhea which was thought to be antibiotic associated, although the patient states that she has had loose stools and abdominal pain intermittently over the last 6 months which has worsened over the past week.  Since discharge from [facility], the patient has had some subjective fevers and chills.  She has also developed a nonproductive cough with worsening shortness of breath.  There is no hemoptysis.  She was scheduled to follow up with a gastroenterologist (for the diarrhea) and a cardiologist (for the afib). She came to the ED because she was unable to tolerate the ongoing diarrhea.  Her sister is at the bedside and reports that Shelly Maldonado has not been eating well and that she has been incontinent of large volumes of liquid stool intermittently. She also has severe intermittent abdominal pain, located in the epigastric area and rated 8-9/10 at worst, she states that eating makes her abdominal pain worse.  She continues to have nausea with intermittent dry heaving. ?In the ED, the patient was afebrile and hemodynamically stable with oxygen saturation 95% room air.  She was tachycardic with heart rate 100-115.  CT of the abdomen and pelvis showed suspicion of the jejunal ischemia with abnormal bowel thickening involving multiple loops of the  jejunum associated with suspected emboli in the jejunal branches of the SMA.  There is also a small acute splenic infarct.  The patient was started on IV heparin and Zosyn.  General surgery was consulted to assist with management.  ? ?Assessment/Plan: ? ? ?Principal Problem: ?  Ischemic enteritis (Shelly Maldonado) ?Active Problems: ?  Splenic infarct ?  Lobar pneumonia (Shelly Maldonado) ?  Chronic atrial fibrillation with RVR (HCC) ?  Stage 3a chronic kidney disease (CKD) (Shelly Maldonado) ?  HTN (hypertension) ?  Chronic pancreatitis (Shelly Maldonado) ?  Hypokalemia ?  Pyuria ? ?Assessment and Plan: ?* Ischemic enteritis (Shelly Maldonado) ?Etiology is likely atrial fibrillation resulting in thromboembolic event ?-check Factor V Leiden, anticardiolipin antibody, prothrombin gene mutation ?-10/21/21- CT abdomen as discussed above ?-Continue IV heparin ?-Continue IV fluids ?-Clear liquid diet ?-General surgery consult ? ?Splenic infarct ?Etiology is atrial fibrillation ?-check Factor V Leiden, anticardiolipin antibody, prothrombin gene mutation ?-Continue IV heparin ?-Judicious opioids ? ?Lobar pneumonia (Shelly Maldonado) ?Personally reviewed CXR--RUL opacity ?-continuing zosyn ?-MRSA screen ?-check PCT ? ?Chronic atrial fibrillation with RVR (HCC) ?Start metoprolol IV in lieu of bisoprolol (PTA med) ? ?Stage 3a chronic kidney disease (CKD) (Shelly Maldonado) ?Baseline creatinine 0.8-1.0 ? ?Pyuria ?She also has dysuria ?-11-20 WBC on UA ?-follow urine culture ?-already on zosyn ? ?Hypokalemia ?Replete ?Check magnesium ? ?Chronic pancreatitis (Shelly Maldonado) ?Continue Creon the patient is able to tolerate p.o. ? ?HTN (hypertension) ?Switch bisoprolol to metoprolol heart rate for rate control titration for her atrial fibrillation ? ? ? ? ? ? ? ? ?Status is: Inpatient ?Remains inpatient appropriate because: severity of illness requiring IV antibiotics, IV heparin ? ? ? ?  Family Communication:  no Family at bedside ? ?Consultants:  general surgery ? ?Code Status:  FULL  ? ?DVT Prophylaxis:  IV Heparin   ? ? ?Procedures: ?As Listed in Progress Note Above ? ?Antibiotics: ?Zosyn 4/10>> ? ? ? ? ? ?Subjective: ?Pt states abd pain is a little better.  Has nausea, no emesis.  No BM since admission.  Denies f/c, cp,hemotypsis, hematochezia, melena.  She has dysuria ? ?Objective: ?Vitals:  ? 10/21/21 1900 10/21/21 2330 10/22/21 0000 10/22/21 0405  ?BP: 124/64 132/90 135/88 118/76  ?Pulse: 96 (!) 103 (!) 109 (!) 105  ?Resp: (!) 28 20 (!) 34 (!) 28  ?Temp:      ?SpO2: 93% 91% 92% 100%  ?Weight:      ?Height:      ? ? ?Intake/Output Summary (Last 24 hours) at 10/22/2021 0726 ?Last data filed at 10/21/2021 1547 ?Gross per 24 hour  ?Intake --  ?Output 13 ml  ?Net -13 ml  ? ?Weight change:  ?Exam: ? ?General:  Pt is alert, follows commands appropriately, not in acute distress ?HEENT: No icterus, No thrush, No neck mass, [place]/AT ?Cardiovascular: RRR, S1/S2, no rubs, no gallops ?Respiratory: bibasilar rales.  Mild basilar wheeze ?Abdomen: Soft/+BS, epigastric tender, non distended, no guarding ?Extremities: No edema, No lymphangitis, No petechiae, No rashes, no synovitis ? ? ?Data Reviewed: ?I have personally reviewed following labs and imaging studies ?Basic Metabolic Panel: ?Recent Labs  ?Lab 10/21/21 ?1442  ?NA 136  ?K 3.0*  ?CL 102  ?CO2 25  ?GLUCOSE 105*  ?BUN 11  ?CREATININE 1.02*  ?CALCIUM 8.0*  ? ?Liver Function Tests: ?Recent Labs  ?Lab 10/21/21 ?1442  ?AST 30  ?ALT 36  ?ALKPHOS 67  ?BILITOT 0.6  ?PROT 6.3*  ?ALBUMIN 3.0*  ? ?Recent Labs  ?Lab 10/21/21 ?1442  ?LIPASE 84*  ? ?No results for input(s): AMMONIA in the last 168 hours. ?Coagulation Profile: ?No results for input(s): INR, PROTIME in the last 168 hours. ?CBC: ?Recent Labs  ?Lab 10/21/21 ?1442 10/22/21 ?0351  ?WBC 10.9* 10.0  ?NEUTROABS 7.3  --   ?HGB 12.6 11.5*  ?HCT 38.9 35.9*  ?MCV 96.8 98.6  ?PLT 417* 385  ? ?Cardiac Enzymes: ?No results for input(s): CKTOTAL, CKMB, CKMBINDEX, TROPONINI in the last 168 hours. ?BNP: ?Invalid input(s): POCBNP ?CBG: ?No results for  input(s): GLUCAP in the last 168 hours. ?HbA1C: ?No results for input(s): HGBA1C in the last 72 hours. ?Urine analysis: ?   ?Component Value Date/Time  ? COLORURINE YELLOW 10/21/2021 1547  ? APPEARANCEUR CLEAR 10/21/2021 1547  ? LABSPEC 1.005 10/21/2021 1547  ? PHURINE 6.0 10/21/2021 1547  ? GLUCOSEU NEGATIVE 10/21/2021 1547  ? HGBUR SMALL (A) 10/21/2021 1547  ? [place] NEGATIVE 10/21/2021 1547  ? [place] NEGATIVE 10/21/2021 1547  ? PROTEINUR NEGATIVE 10/21/2021 1547  ? NITRITE POSITIVE (A) 10/21/2021 1547  ? LEUKOCYTESUR TRACE (A) 10/21/2021 1547  ? ?Sepsis Labs: ?@LABRCNTIP (procalcitonin:4,lacticidven:4) ?)No results found for this or any previous visit (from the past 240 hour(s)).  ? ?Scheduled Meds: ? bisoprolol  10 mg Oral Daily  ? ?Continuous Infusions: ? 0.9 % NaCl with KCl 40 mEq / L 100 mL/hr at 10/22/21 [id number]  ? heparin 1,350 Units/hr (10/22/21 0548)  ? piperacillin-tazobactam (ZOSYN)  IV 3.375 g (10/22/21 0550)  ? ? ?Procedures/Studies: ?CT ABDOMEN PELVIS W CONTRAST ? ?Result Date: 10/21/2021 ?CLINICAL DATA:  Left upper quadrant abdominal pain. Nausea, vomiting, and diarrhea for 3 days. EXAM: CT ABDOMEN AND PELVIS WITH CONTRAST TECHNIQUE: Multidetector CT imaging of  the abdomen and pelvis was performed using the standard protocol following bolus administration of intravenous contrast. RADIATION DOSE REDUCTION: This exam was performed according to the departmental dose-optimization program which includes automated exposure control, adjustment of the mA and/or kV according to patient size and/or use of iterative reconstruction technique. CONTRAST:  159mL OMNIPAQUE IOHEXOL 300 MG/ML  SOLN COMPARISON:  02/04/2018 FINDINGS: Lower chest: Small left pleural effusion. Trace right pleural fluid. Borderline cardiomegaly. Descending thoracic aortic atherosclerosis. Small type 1 hiatal hernia. Hepatobiliary: Cholecystectomy. 5 by 3 mm hypodense lesion in the right hepatic lobe on image 11 of series 2 is faintly  appreciable on 02/04/2018 and highly likely to be benign/incidental. Pancreas: Unremarkable Spleen: 1.3 by 1.0 by 1.3 cm hypodensity posteriorly in the spleen is new compared to 02/04/2018, and technically nonspecific, bu

## 2021-10-22 NOTE — Assessment & Plan Note (Addendum)
Etiology is likely atrial fibrillation resulting in thromboembolic event ?-check Factor V Leiden, anticardiolipin antibody, prothrombin gene mutation ?-10/21/21- CT abdomen as discussed above ?-Continue IV heparin ?-Continue IV fluids ?-Clear liquid diet ?-General surgery consult ?

## 2021-10-23 ENCOUNTER — Inpatient Hospital Stay (HOSPITAL_COMMUNITY): Payer: Medicare Other

## 2021-10-23 DIAGNOSIS — E440 Moderate protein-calorie malnutrition: Secondary | ICD-10-CM | POA: Insufficient documentation

## 2021-10-23 DIAGNOSIS — I482 Chronic atrial fibrillation, unspecified: Secondary | ICD-10-CM | POA: Diagnosis not present

## 2021-10-23 DIAGNOSIS — I159 Secondary hypertension, unspecified: Secondary | ICD-10-CM

## 2021-10-23 DIAGNOSIS — K559 Vascular disorder of intestine, unspecified: Secondary | ICD-10-CM | POA: Diagnosis not present

## 2021-10-23 DIAGNOSIS — E669 Obesity, unspecified: Secondary | ICD-10-CM

## 2021-10-23 DIAGNOSIS — K861 Other chronic pancreatitis: Secondary | ICD-10-CM | POA: Diagnosis not present

## 2021-10-23 LAB — CBC WITH DIFFERENTIAL/PLATELET
Abs Immature Granulocytes: 0.24 10*3/uL — ABNORMAL HIGH (ref 0.00–0.07)
Basophils Absolute: 0.1 10*3/uL (ref 0.0–0.1)
Basophils Relative: 1 %
Eosinophils Absolute: 0.1 10*3/uL (ref 0.0–0.5)
Eosinophils Relative: 1 %
HCT: 35.1 % — ABNORMAL LOW (ref 36.0–46.0)
Hemoglobin: 11.5 g/dL — ABNORMAL LOW (ref 12.0–15.0)
Immature Granulocytes: 2 %
Lymphocytes Relative: 9 %
Lymphs Abs: 1.1 10*3/uL (ref 0.7–4.0)
MCH: 32.6 pg (ref 26.0–34.0)
MCHC: 32.8 g/dL (ref 30.0–36.0)
MCV: 99.4 fL (ref 80.0–100.0)
Monocytes Absolute: 0.9 10*3/uL (ref 0.1–1.0)
Monocytes Relative: 8 %
Neutro Abs: 9 10*3/uL — ABNORMAL HIGH (ref 1.7–7.7)
Neutrophils Relative %: 79 %
Platelets: 440 10*3/uL — ABNORMAL HIGH (ref 150–400)
RBC: 3.53 MIL/uL — ABNORMAL LOW (ref 3.87–5.11)
RDW: 13.7 % (ref 11.5–15.5)
WBC: 11.4 10*3/uL — ABNORMAL HIGH (ref 4.0–10.5)
nRBC: 0 % (ref 0.0–0.2)

## 2021-10-23 LAB — BLOOD GAS, VENOUS
Acid-Base Excess: 2.1 mmol/L — ABNORMAL HIGH (ref 0.0–2.0)
Bicarbonate: 25.3 mmol/L (ref 20.0–28.0)
Drawn by: 6381
FIO2: 50 %
O2 Saturation: 96.9 %
Patient temperature: 37.9
pCO2, Ven: 35 mmHg — ABNORMAL LOW (ref 44–60)
pH, Ven: 7.47 — ABNORMAL HIGH (ref 7.25–7.43)
pO2, Ven: 80 mmHg — ABNORMAL HIGH (ref 32–45)

## 2021-10-23 LAB — BASIC METABOLIC PANEL
Anion gap: 12 (ref 5–15)
BUN: 7 mg/dL — ABNORMAL LOW (ref 8–23)
CO2: 19 mmol/L — ABNORMAL LOW (ref 22–32)
Calcium: 7.8 mg/dL — ABNORMAL LOW (ref 8.9–10.3)
Chloride: 105 mmol/L (ref 98–111)
Creatinine, Ser: 0.84 mg/dL (ref 0.44–1.00)
GFR, Estimated: >60 mL/min (ref >60)
Glucose, Bld: 100 mg/dL — ABNORMAL HIGH (ref 70–99)
Potassium: 3.5 mmol/L (ref 3.5–5.1)
Sodium: 136 mmol/L (ref 135–145)

## 2021-10-23 LAB — HEPARIN LEVEL (UNFRACTIONATED): Heparin Unfractionated: >1.1 IU/mL — ABNORMAL HIGH (ref 0.30–0.70)

## 2021-10-23 LAB — LUPUS ANTICOAGULANT PANEL
DRVVT: 59 s — ABNORMAL HIGH (ref 0.0–47.0)
PTT Lupus Anticoagulant: 39.3 s (ref 0.0–43.5)

## 2021-10-23 LAB — APTT
aPTT: 34 seconds (ref 24–36)
aPTT: >200 seconds (ref 24–36)

## 2021-10-23 LAB — DRVVT MIX: dRVVT Mix: 45.6 s — ABNORMAL HIGH (ref 0.0–40.4)

## 2021-10-23 LAB — DRVVT CONFIRM: dRVVT Confirm: 0.9 ratio (ref 0.8–1.2)

## 2021-10-23 MED ORDER — ENOXAPARIN SODIUM 80 MG/0.8ML IJ SOSY
80.0000 mg | PREFILLED_SYRINGE | Freq: Two times a day (BID) | INTRAMUSCULAR | Status: DC
  Administered 2021-10-23 – 2021-10-25 (×4): 80 mg via SUBCUTANEOUS
  Filled 2021-10-23 (×5): qty 0.8

## 2021-10-23 MED ORDER — BOOST / RESOURCE BREEZE PO LIQD CUSTOM
1.0000 | Freq: Three times a day (TID) | ORAL | Status: DC
  Administered 2021-10-23 – 2021-10-25 (×5): 1 via ORAL

## 2021-10-23 MED ORDER — METOPROLOL TARTRATE 25 MG PO TABS
25.0000 mg | ORAL_TABLET | Freq: Four times a day (QID) | ORAL | Status: DC
  Administered 2021-10-23 – 2021-10-26 (×10): 25 mg via ORAL
  Filled 2021-10-23 (×10): qty 1

## 2021-10-23 MED ORDER — LORAZEPAM 2 MG/ML IJ SOLN
1.0000 mg | Freq: Once | INTRAMUSCULAR | Status: AC
  Administered 2021-10-23: 1 mg via INTRAVENOUS
  Filled 2021-10-23: qty 1

## 2021-10-23 MED ORDER — METOPROLOL TARTRATE 25 MG PO TABS
25.0000 mg | ORAL_TABLET | Freq: Two times a day (BID) | ORAL | Status: DC
  Administered 2021-10-23: 25 mg via ORAL
  Filled 2021-10-23: qty 1

## 2021-10-23 MED ORDER — DILTIAZEM HCL 25 MG/5ML IV SOLN
10.0000 mg | Freq: Once | INTRAVENOUS | Status: AC
  Administered 2021-10-23: 10 mg via INTRAVENOUS
  Filled 2021-10-23: qty 5

## 2021-10-23 MED ORDER — DILTIAZEM LOAD VIA INFUSION
10.0000 mg | Freq: Once | INTRAVENOUS | Status: AC
Start: 2021-10-23 — End: 2021-10-23
  Administered 2021-10-23: 10 mg via INTRAVENOUS
  Filled 2021-10-23: qty 10

## 2021-10-23 MED ORDER — DILTIAZEM HCL-DEXTROSE 125-5 MG/125ML-% IV SOLN (PREMIX)
5.0000 mg/h | INTRAVENOUS | Status: DC
  Administered 2021-10-23: 5 mg/h via INTRAVENOUS
  Filled 2021-10-23 (×3): qty 125

## 2021-10-23 MED ORDER — IPRATROPIUM BROMIDE 0.02 % IN SOLN
0.5000 mg | Freq: Three times a day (TID) | RESPIRATORY_TRACT | Status: DC
  Administered 2021-10-23 – 2021-10-26 (×11): 0.5 mg via RESPIRATORY_TRACT
  Filled 2021-10-23 (×11): qty 2.5

## 2021-10-23 MED ORDER — LEVALBUTEROL HCL 0.63 MG/3ML IN NEBU
0.6300 mg | INHALATION_SOLUTION | Freq: Three times a day (TID) | RESPIRATORY_TRACT | Status: DC
Start: 2021-10-23 — End: 2021-10-26
  Administered 2021-10-23 – 2021-10-26 (×11): 0.63 mg via RESPIRATORY_TRACT
  Filled 2021-10-23 (×12): qty 3

## 2021-10-23 NOTE — Progress Notes (Addendum)
?Progress Note ? ? ?Patient: Shelly Maldonado [id number] DOB: [birth year] DOA: 10/21/2021     2 ?DOS: the patient was seen and examined on 10/23/2021 ?  ?Brief hospital course: ?76 year old female with a history of atrial fibrillation, chronic pancreatitis, hypertension, newly diagnosed atrial fibrillation presenting with upper abdominal pain, nausea, and loose stools worsening over the past week.  Notably, the patient had a recent hospitalization in [place] from 10/11/2021 to 10/16/2021.  She was treated for her UTI,  and during that stay she developed new onset atrial fibrillation for which she was started on metoprolol 25 mg twice daily and apixaban.  During that hospital stay, the patient was noted to have diarrhea which was thought to be antibiotic associated, although the patient states that she has had loose stools and abdominal pain intermittently over the last 6 months which has worsened over the past week.  Since discharge from [facility], the patient has had some subjective fevers and chills.  She has also developed a nonproductive cough with worsening shortness of breath.  There is no hemoptysis.  She was scheduled to follow up with a gastroenterologist (for the diarrhea) and a cardiologist (for the afib). She came to the ED because she was unable to tolerate the ongoing diarrhea.  Her sister is at the bedside and reports that Mrs. Speedy has not been eating well and that she has been incontinent of large volumes of liquid stool intermittently. She also has severe intermittent abdominal pain, located in the epigastric area and rated 8-9/10 at worst, she states that eating makes her abdominal pain worse.  She continues to have nausea with intermittent dry heaving. ?In the ED, the patient was afebrile and hemodynamically stable with oxygen saturation 95% room air.  She was tachycardic with heart rate 100-115.  CT of the abdomen and pelvis showed suspicion of the jejunal ischemia with abnormal bowel  thickening involving multiple loops of the jejunum associated with suspected emboli in the jejunal branches of the SMA.  There is also a small acute splenic infarct.  The patient was started on IV heparin and Zosyn.  General surgery was consulted to assist with management. ? ?Assessment and Plan: ?* Ischemic enteritis ([place]) ?Etiology is likely atrial fibrillation resulting in thromboembolic event ?-check Factor V Leiden, anticardiolipin antibody, prothrombin gene mutation ?-10/21/21- CT abdomen as discussed above ?-Continue anticoagulation, currently on Lovenox ?-Continue IV fluids ?-Clear liquid diet ?-Seen by general surgery, operative management not recommended at this time.  Continue supportive care ? ?Splenic infarct ?Etiology is atrial fibrillation ?-check Factor V Leiden, anticardiolipin antibody, prothrombin gene mutation ?-Changed from IV heparin to Lovenox due to elevated PTT levels ?-Judicious opioids ? ?Lobar pneumonia ([place]) ?Personally reviewed CXR--RUL opacity ?-continuing zosyn ?-MRSA screen ?-PCT <0.1 ? ?Chronic atrial fibrillation with RVR (HCC) ?Heart rate uncontrolled on metoprolol ?She has been started on Cardizem infusion for better rate control ?Anticoagulated with Lovenox ? ?Stage 3a chronic kidney disease (CKD) ([place]) ?Baseline creatinine 0.8-1.0 ? ?Pyuria ?She also has dysuria ?-11-20 WBC on UA ?-follow urine culture ?-already on zosyn ? ?Hypokalemia ?Replete ?Check magnesium ? ?Chronic pancreatitis ([place]) ?Continue Creon the patient is able to tolerate p.o. ? ?HTN (hypertension) ?Switch bisoprolol to metoprolol heart rate for rate control titration for her atrial fibrillation ? ? ?Obesity ?BMI 31.0 ?We will need lifestyle changes ? ?  ? ?Subjective: She was feeling short of breath earlier today, now feeling better.  She has nonproductive cough.  Noted to be tachycardic and rapid atrial fibs ? ?Physical Exam: ?  Vitals:  ? 10/23/21 1450 10/23/21 1806 10/23/21 1905 10/23/21 1924  ?BP: (!) 113/49  (!) 153/82  (!) 151/96  ?Pulse: (!) 126 (!) 124  (!) 110  ?Resp: (!) 25   (!) 35  ?Temp:   100.3 ?F (37.9 ?C)   ?TempSrc:   Axillary   ?SpO2: 96%   97%  ?Weight:    80 kg  ?Height:      ? ?General exam: Alert, awake, oriented x 3 ?Respiratory system: Clear to auscultation. Respiratory effort normal. ?Cardiovascular system: Irregular.  No murmurs, rubs, gallops. ?Gastrointestinal system: Abdomen is nondistended, soft and nontender. No organomegaly or masses felt. Normal bowel sounds heard. ?Central nervous system: Alert and oriented. No focal neurological deficits. ?Extremities: No C/C/E, +pedal pulses ?Skin: No rashes, lesions or ulcers ?Psychiatry: Judgement and insight appear normal. Mood & affect appropriate.  ? ?Data Reviewed: ? ?Reviewed chest x-ray with concerns for right-sided pneumonia, WBC count mildly elevated, remainder of labs unremarkable. ? ?Family Communication: No family present ? ?Disposition: ?Status is: Inpatient ?Remains inpatient appropriate because: Continued management of rapid atrial fibrillation ? Planned Discharge Destination: Skilled nursing facility ? ? ? ?Time spent: 35 minutes ? ?Author: ?Kathie Dike, MD ?10/23/2021 8:08 PM ? ?For on call review [email or web address].  ?

## 2021-10-23 NOTE — Progress Notes (Signed)
?   10/23/21 0810  ?Assess: MEWS Score  ?BP 124/64  ?Pulse Rate (!) 146  ?Resp 18  ?Level of Consciousness Alert  ?SpO2 96 %  ?O2 Device Nasal Cannula  ?O2 Flow Rate (L/min) 3 L/min  ?Assess: MEWS Score  ?MEWS Temp 0  ?MEWS Systolic 0  ?MEWS Pulse 3  ?MEWS RR 0  ?MEWS LOC 0  ?MEWS Score 3  ?MEWS Score Color Yellow  ?Assess: if the MEWS score is Yellow or Red  ?Were vital signs taken at a resting state? Yes  ?Focused Assessment No change from prior assessment  ?Kale Dols Detection of Sepsis Score *See Row Information* Medium  ?MEWS guidelines implemented *See Row Information* Yes  ?Treat  ?MEWS Interventions Administered prn meds/treatments  ?Pain Scale 0-10  ?Pain Score 0  ?Take Vital Signs  ?Increase Vital Sign Frequency  Yellow: Q 2hr X 2 then Q 4hr X 2, if remains yellow, continue Q 4hrs  ?Notify: Provider  ?Provider Name/Title MD Memon  ?Date Provider Notified 10/23/21  ?Time Provider Notified [phone]  ? ? ?

## 2021-10-23 NOTE — Care Management Important Message (Signed)
Important Message ? ?Patient Details  ?Name: Shelly Maldonado ?MRN: [id number] ?Date of Birth: 1945-10-11 ? ? ?Medicare Important Message Given:  N/A - LOS <3 / Initial given by admissions ? ? ? ? ?Dannette Barbara ?10/23/2021, 10:11 AM ?

## 2021-10-23 NOTE — Progress Notes (Signed)
?Subjective: ?Patient has had frequent bowel movements.  Denies any nausea or vomiting.  Minimal abdominal pain is noted.  It is more crampy in nature. ? ?Objective: ?Vital signs in last 24 hours: ?Temp:  [98.1 ?F (36.7 ?C)-99.7 ?F (37.6 ?C)] 98.1 ?F (36.7 ?C) (04/12 [id number]) ?Pulse Rate:  [91-146] 146 (04/12 0810) ?Resp:  [17-25] 18 (04/12 0810) ?BP: (103-149)/(64-96) 124/64 (04/12 0810) ?SpO2:  [93 %-97 %] 96 % (04/12 0810) ?Last BM Date : 10/21/21 ? ?Intake/Output from previous day: ?04/11 0701 - 04/12 0700 ?In: 2189.1 [P.O.:480; I.V.:1592.1; IV Piggyback:117] ?Out: 600 [Urine:600] ?Intake/Output this shift: ?Total I/O ?In: 480 [P.O.:480] ?Out: -  ? ?General appearance: alert, cooperative, and no distress ?GI: soft, non-tender; bowel sounds normal; no masses,  no organomegaly ? ?Lab Results:  ?Recent Labs  ?  10/22/21 ?0351 10/23/21 ?0020  ?WBC 10.0 11.4*  ?HGB 11.5* 11.5*  ?HCT 35.9* 35.1*  ?PLT 385 440*  ? ?BMET ?Recent Labs  ?  10/21/21 ?1442 10/23/21 ?0020  ?NA 136 136  ?K 3.0* 3.5  ?CL 102 105  ?CO2 25 19*  ?GLUCOSE 105* 100*  ?BUN 11 7*  ?CREATININE 1.02* 0.84  ?CALCIUM 8.0* 7.8*  ? ?PT/INR ?No results for input(s): LABPROT, INR in the last 72 hours. ? ?Studies/Results: ?CT ABDOMEN PELVIS W CONTRAST ? ?Result Date: 10/21/2021 ?CLINICAL DATA:  Left upper quadrant abdominal pain. Nausea, vomiting, and diarrhea for 3 days. EXAM: CT ABDOMEN AND PELVIS WITH CONTRAST TECHNIQUE: Multidetector CT imaging of the abdomen and pelvis was performed using the standard protocol following bolus administration of intravenous contrast. RADIATION DOSE REDUCTION: This exam was performed according to the departmental dose-optimization program which includes automated exposure control, adjustment of the mA and/or kV according to patient size and/or use of iterative reconstruction technique. CONTRAST:  1104mL OMNIPAQUE IOHEXOL 300 MG/ML  SOLN COMPARISON:  02/04/2018 FINDINGS: Lower chest: Small left pleural effusion. Trace right  pleural fluid. Borderline cardiomegaly. Descending thoracic aortic atherosclerosis. Small type 1 hiatal hernia. Hepatobiliary: Cholecystectomy. 5 by 3 mm hypodense lesion in the right hepatic lobe on image 11 of series 2 is faintly appreciable on 02/04/2018 and highly likely to be benign/incidental. Pancreas: Unremarkable Spleen: 1.3 by 1.0 by 1.3 cm hypodensity posteriorly in the spleen is new compared to 02/04/2018, and technically nonspecific, but in conjunction with other findings in the abdomen this is probably a small splenic infarct. Adrenals/Urinary Tract: Small hypodense renal lesions in the 5 mm diameter range are present for example on image 20 on the right and image 20 on the left, highly likely to be small benign cysts or similar benign lesions although technically nonspecific due to small size. A 1.0 by 0.7 cm exophytic fluid density or near fluid density lesion from the left kidney upper pole on image 84 series 6 is similar to the 02/04/2018 exam and most compatible with a benign cyst. In general, such lesions are not felt to warrant further workup unless there is a specific renal clinical concern. The adrenal glands appear normal. The urinary bladder appears normal. Stomach/Bowel: There is abnormal wall thickening and mildly accentuated mesenteric stranding along loops of left upper quadrant jejunum compatible with inflammation. No pneumatosis or extraluminal gas. No visible abscess. No overtly dilated jejunal loops (although 1 loop is upper normal diameter in the left upper quadrant on image 25 series 2). I do not see any twisting of the bowel or swirling of the mesentery, nor other classic findings of internal hernia. Mildly accentuated mucosal enhancement in the cecum. Probable  diverticulum containing chronic contrast medium versus a calcification adjacent to the junction of the descending and sigmoid colon on image 57 series 2. No portal venous gas. Vascular/Lymphatic: Please note that this exam  is not a CT angiogram. There is atherosclerotic calcification of the abdominal aorta and its branches. There is some plaque along the proximal origin of the celiac trunk but proximally the celiac trunk and SMA appear widely patent. However, on images 32 through 36 of series 2, filling defect compatible with acute thrombus or embolus in spell jejunal branches of the SMA, making the bowel wall thickening highly suspicious for ischemia. Both of these branches probably have contrast distal to the site of filling defect, although the more caudad is somewhat diminutive. This could indicate either not completely occlusive embolus, or some degree of collateralization. No portal venous gas. Reproductive: Uterus absent.  Adnexa unremarkable. Other: No supplemental non-categorized findings. Musculoskeletal: Small umbilical hernia contains adipose tissue. IMPRESSION: 1. High suspicion for jejunal ischemia. Abnormal bowel wall thickening of multiple loops of jejunum associated with suspected acute emboli within two proximal jejunal branches of the SMA. Both branches appear to have some contrast extending distal to the emboli (although the more distal branch is diminutive) which may indicate lack of total occlusion or collateralization. 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 The appearance raises high concern for a central source of emboli. Consider benefits of anticoagulation and search for source of systemic arterial embolic disease. There is some moderate abdominal aortic atherosclerotic calcification although relatively little calcified atherosclerosis in the SMA itself. Please note that today's exam was not a CT angiogram, but the findings above are still felt to be high confidence. 2. Small left and trace right pleural effusion with borderline cardiomegaly. Critical Value/emergent results were called by telephone at the time of interpretation on  10/21/2021 at 5:11 pm to provider JULIE IDOL , who verbally acknowledged these results. Electronically Signed   By: Van Clines M.D.   On: 10/21/2021 17:35  ? ?DG CHEST PORT 1 VIEW ? ?Result Date: 10/21/2021 ?CLINICAL DATA:  Weakness EXAM: PORTABLE CHEST 1 VIEW COMPARISON:  None. FINDINGS: Cardiac shadow is within normal limits. Lungs are well aerated bilaterally. Wedge-shaped density is noted in the right upper lobe emanating from the right hilum. This may simply represent acute infiltrate. No other focal abnormality is noted. IMPRESSION: Somewhat wedge-shaped density emanating from the right hilum likely representing infiltrate. Short-term follow-up following appropriate therapy is recommended. Electronically Signed   By: Inez Catalina M.D.   On: 10/21/2021 20:01  ? ?ECHOCARDIOGRAM COMPLETE ? ?Result Date: 10/22/2021 ?   ECHOCARDIOGRAM REPORT   Patient Name:   Shelly Maldonado Date of Exam: 10/22/2021 Medical Rec #:  [id number]      Height:       64.5 in Accession #:    [id number]     Weight:       183.5 lb Date of Birth:  1945/11/05       BSA:          1.896 m? Patient Age:    76 years       BP:           140/93 mmHg Patient Gender: F              HR:           100 bpm. Exam Location:  [facility] Procedure: 2D Echo, Cardiac Doppler and  Color Doppler Indications:    Atrial Fibrillation  History:        Patient has no prior history of Echocardiogram examinations.                 Risk Factors:Hypertension. Images by Lonn Georgia, student.  Sonographer:    Wenda Low Referring Phys: [phone] DAVID TAT  Sonographer Comments: Image acquisition challenging due to respiratory motion. IMPRESSIONS  1. Left ventricular ejection fraction, by estimation, is 60 to 65%. The left ventricle has normal function. The left ventricle has no regional wall motion abnormalities. There is mild left ventricular hypertrophy of the basal-septal segment. Left ventricular diastolic function could not be evaluated.  2. Right ventricular systolic  function is normal. The right ventricular size is normal. There is moderately elevated pulmonary artery systolic pressure. The estimated right ventricular systolic pressure is [id number] mmHg.  3. The pericardial

## 2021-10-23 NOTE — Progress Notes (Signed)
Pt placed on BIPAP due to increased HR, RR, and decreased loc. RT called to patient to wake up to talk, respond and communicate, pt slow, RT request BIPAP and MD to consult.  ?

## 2021-10-23 NOTE — Progress Notes (Signed)
ANTICOAGULATION CONSULT NOTE - Follow Up Consult ? ?Pharmacy Consult for Heparin>>Lovenox ?Indication: atrial fibrillation/Ischemic enteritis/splenic infarct ? ?Patient Measurements: ?Height: 5' 4.5" (163.8 cm) ?Weight: 83.2 kg (183 lb 8 oz) ?IBW/kg (Calculated) : 55.85 ?Heparin Dosing Weight: 73.8 ? ?Labs: ?Recent Labs  ?  10/21/21 ?1442 10/21/21 ?2120 10/22/21 ?0351 10/22/21 ?1359 10/23/21 ?0020 10/23/21 ?0905  ?HGB 12.6  --  11.5*  --  11.5*  --   ?HCT 38.9  --  35.9*  --  35.1*  --   ?PLT 417*  --  385  --  440*  --   ?APTT  --    < > 30 35 34 >200*  ?HEPARINUNFRC  --   --  >1.10* >1.10*  --  >1.10*  ?CREATININE 1.02*  --   --   --  0.84  --   ? < > = values in this interval not displayed.  ? ? ? ?Estimated Creatinine Clearance: 62 mL/min (by C-G formula based on SCr of 0.84 mg/dL). ? ? ?Medications:  ?Heparin gtt at 1800 units/hr ? 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 PTT now supratherapeutic following several rate increases due to low levels. Discussed with RN - no issues with her lines or signs of bleeding ? ?Heparin level/PTT have been very erratic. D/w Dr. Roderic Palau and will change it to Lovenox instead. Plan is to possible switch back to apixaban tomorrow.  ?Goal of Therapy:  ?Anti-Xa 0.6-1 ?Monitor platelets by anticoagulation protocol: Yes ?  ?Plan:  ?Dc heparin ?Lovenox 80mg  SQ BID ?F/u resume apixaban in AM ? ?Onnie Boer, PharmD, BCIDP, AAHIVP, CPP ?Infectious Disease Pharmacist ?10/23/2021 6:08 PM ? ? ? ? ?

## 2021-10-23 NOTE — Progress Notes (Signed)
ANTICOAGULATION CONSULT NOTE  ? 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 ?Allergies  ?Allergen Reactions  ? Codeine Other (See Comments)  ?  unknown  ? Morphine And Related   ?  Vein became red  ? ? ?Patient Measurements: ?Height: 5' 4.5" (163.8 cm) ?Weight: 83.2 kg (183 lb 8 oz) ?IBW/kg (Calculated) : 55.85 ?Heparin Dosing Weight: 74kg ? ?Vital Signs: ?Temp: 98.6 ?F (37 ?C) (04/11 2116) ?Temp Source: Oral (04/11 2116) ?BP: 128/78 (04/11 2125) ?Pulse Rate: 109 (04/11 2125) ? ?Labs: ?Recent Labs  ?  10/21/21 ?1442 10/21/21 ?2120 10/22/21 ?0351 10/22/21 ?1359 10/23/21 ?0020  ?HGB 12.6  --  11.5*  --   --   ?HCT 38.9  --  35.9*  --   --   ?PLT 417*  --  385  --   --   ?APTT  --    < > 30 35 34  ?HEPARINUNFRC  --   --  >1.10* >1.10*  --   ?CREATININE 1.02*  --   --   --   --   ? < > = values in this interval not displayed.  ? ? ? ?Estimated Creatinine Clearance: 51 mL/min (A) (by C-G formula based on SCr of 1.02 mg/dL (H)). ? ? ?Medical History: ?Past Medical History:  ?Diagnosis Date  ? Atrial fibrillation (HCC)   ? Hypertension   ? Pancreatitis   ? Shingles   ? ? ?Medications:  ?Medications Prior to Admission  ?Medication Sig Dispense Refill Last Dose  ? amLODipine (NORVASC) 5 MG tablet Take 5 mg by mouth daily.   10/21/2021  ? bisoprolol-hydrochlorothiazide (ZIAC) 10-6.25 MG tablet Take 1 tablet by mouth daily.   10/21/2021 at 0800  ? ELIQUIS 5 MG TABS tablet Take 5 mg by mouth 2 (two) times daily.   10/21/2021 at 0800  ? ibuprofen (ADVIL,MOTRIN) 200 MG tablet Take 200 mg by mouth every 6 (six) hours as needed for moderate pain.   unk  ? levocetirizine (XYZAL) 5 MG tablet Take 5 mg by mouth at bedtime.   Past Week  ? metoprolol tartrate (LOPRESSOR) 25 MG tablet Take 25 mg by mouth 2 (two) times daily.   10/21/2021 at 0800  ? CREON 36000-114000 units CPEP capsule Take 1 capsule by mouth 3 (three) times daily before meals. (Patient not taking: Reported on  10/22/2021)   Not Taking  ? ?Scheduled:  ? budesonide (PULMICORT) nebulizer solution  0.5 mg Nebulization BID  ? Chlorhexidine Gluconate Cloth  6 each Topical Daily  ? hydrOXYzine  10 mg Oral Once  ? ipratropium-albuterol  3 mL Nebulization Q6H  ? metoprolol tartrate  2.5 mg Intravenous Q6H  ? ?Infusions:  ? 0.9 % NaCl with KCl 40 mEq / L 100 mL/hr at 10/22/21 1110  ? heparin 1,600 Units/hr (10/22/21 1635)  ? piperacillin-tazobactam (ZOSYN)  IV 3.375 g (10/22/21 2121)  ? ? 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 She has been on apixaban for her afib. She has been apixaban and the last dose was this AM at 0900. We will use PTT for now to monitor until correlation.  ? ?4/12 AM update:  ?aPTT low ? ?Goal of Therapy:  ?Heparin level 0.3-0.7 units/ml ?aPTT 66-102 seconds ?Monitor platelets by anticoagulation protocol: Yes ?  ?Plan:  ?Inc heparin to 1800 units/hr ?0900 aPTT and heparin level ? ?Abran Duke, PharmD, BCPS ?Clinical Pharmacist ?Phone: [phone] ? ? ? ?

## 2021-10-23 NOTE — Progress Notes (Signed)
Patient Oxygen level dropped to 90% on room air. Put patient on 2 Liters via Nasal cannula Oxygen level now 94%. See order to keep O2 saturation above 92% ?

## 2021-10-23 NOTE — Progress Notes (Signed)
Date and time results received: 10/23/21 1107 ? ?Test: PTT ?Critical Value: >300 ? ?Name of Provider Notified: Memon ? ?Orders Received? Or Actions Taken?:  ?Awaiting orders ?

## 2021-10-23 NOTE — Progress Notes (Signed)
?   10/23/21 1052  ?Assess: MEWS Score  ?Temp 97.9 ?F (36.6 ?C)  ?BP (!) 151/106  ?Pulse Rate (!) 134  ?Resp (!) 24  ?SpO2 100 %  ?O2 Device Nasal Cannula  ?O2 Flow Rate (L/min) 3 L/min  ?Assess: MEWS Score  ?MEWS Temp 0  ?MEWS Systolic 0  ?MEWS Pulse 3  ?MEWS RR 1  ?MEWS LOC 0  ?MEWS Score 4  ?MEWS Score Color Red  ?Assess: if the MEWS score is Yellow or Red  ?Were vital signs taken at a resting state? Yes  ?Focused Assessment No change from prior assessment  ?Kirstina Leinweber Detection of Sepsis Score *See Row Information* Medium  ?MEWS guidelines implemented *See Row Information* No, vital signs rechecked  ?Treat  ?MEWS Interventions Administered scheduled meds/treatments  ?Notify: Provider  ?Provider Name/Title MD Memon  ?Date Provider Notified 10/23/21  ?Time Provider Notified 1056  ? ? ?

## 2021-10-23 NOTE — Progress Notes (Signed)
ANTICOAGULATION CONSULT NOTE - Follow Up Consult ? ?Pharmacy Consult for Heparin ?Indication: atrial fibrillation/Ischemic enteritis/splenic infarct ? ?Patient Measurements: ?Height: 5' 4.5" (163.8 cm) ?Weight: 83.2 kg (183 lb 8 oz) ?IBW/kg (Calculated) : 55.85 ?Heparin Dosing Weight: 73.8 ? ?Labs: ?Recent Labs  ?  10/21/21 ?1442 10/21/21 ?2120 10/22/21 ?0351 10/22/21 ?1359 10/23/21 ?0020 10/23/21 ?0905  ?HGB 12.6  --  11.5*  --  11.5*  --   ?HCT 38.9  --  35.9*  --  35.1*  --   ?PLT 417*  --  385  --  440*  --   ?APTT  --    < > 30 35 34 >200*  ?HEPARINUNFRC  --   --  >1.10* >1.10*  --  >1.10*  ?CREATININE 1.02*  --   --   --  0.84  --   ? < > = values in this interval not displayed.  ? ? ?Estimated Creatinine Clearance: 62 mL/min (by C-G formula based on SCr of 0.84 mg/dL). ? ? ?Medications:  ?Heparin gtt at 1800 units/hr ? 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 PTT now supratherapeutic following several rate increases due to low levels. Discussed with RN - no issues with her lines or signs of bleeding ? ?Goal of Therapy:  ?Heparin Level 0.3 -0.7  ?aPTT 66-102 seconds ?Monitor platelets by anticoagulation protocol: Yes ?  ?Plan:  ?Hold heparin drip for 1 hour ?Restart at a reduced rate of 15.5 Units/hr = 15.5 ml/hr ?aPTT in 8 hours ?Daily HL, aPTT, CBC ?Monitor for any bleeding signs ? ?Shelly Maldonado ?10/23/2021,11:24 AM ? ? ?

## 2021-10-23 NOTE — Progress Notes (Signed)
Initial Nutrition Assessment ? ?DOCUMENTATION CODES:  ? ?Non-severe (moderate) malnutrition in context of acute illness/injury ? ?INTERVENTION:  ?Boost Breeze po TID  ? ?Soft diet ? ?Multivitamin ? ?NUTRITION DIAGNOSIS:  ? ?Moderate Malnutrition related to acute illness (~ 2 weeks minimal intake, diarrhea) as evidenced by energy intake < 75% for > 7 days, per patient/family report, mild fat depletion, mild muscle depletion. ? ?GOAL:  ?Patient will meet greater than or equal to 90% of their needs ? ? ?MONITOR:  ?PO intake, Supplement acceptance, Diet advancement, Labs, Weight trends ? ?REASON FOR ASSESSMENT:  ? ?Malnutrition Screening Tool ?  ? ?ASSESSMENT: Patient is an  obese 75 yo female with history of HTN, Pancreatitis and Atrial fibrillation. Complaint of nausea, vomiting and diarrhea at admission. Patient reports very little nutrition since March 30th (~ 2 weeks).  ? 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  Splenic infarct.  ? ?Patient intake -liquids at breakfast was minimal. She doesn't like orange jello, cranberry, apple or grape juice.  ? ?Diet has been advanced for lunch hopefully will be more appealing for her. Diarrhea better today per patient. Able to feed herself at baseline. S ? ?he did drink all of the Boost Breeze. Encouraged oral intake protein and energy -frequent small meals /ONS throughout the day.  ? ?Review of weights- usually between 83-88 kg > 3 years.  ? ?Medications and Labs.  ? ? IVF- NS /KCL @ 50 ml/hr. Antibiotic- Zosyn ?  ?NUTRITION - FOCUSED PHYSICAL EXAM: ?Nutrition-Focused physical exam completed. Findings are mild orbital and buccal fat depletion, mild clavicle, scapular, deltoid muscle depletion, and no edema.    ? ?Diet Order:   ?Diet Order   ? ?       ?  DIET SOFT Room service appropriate? Yes; Fluid consistency: Thin  Diet effective now       ?  ? ?  ?  ? ?  ? ? ?EDUCATION NEEDS:  ?Education needs have been addressed ? ?Skin:  Skin  Assessment: Reviewed RN Assessment ? ?Last BM:  4/10 ? ?Height:  ? ?Ht Readings from Last 1 Encounters:  ?10/21/21 5' 4.5" (1.638 m)  ? ? ?Weight:  ? ?Wt Readings from Last 1 Encounters:  ?10/21/21 83.2 kg  ? ? ?Ideal Body Weight:   57 kg  ? ?BMI:  Body mass index is 31.01 kg/m?. ? ?Estimated Nutritional Needs:  ? ?Kcal:  [id number] ? ?Protein:  80-86 gr ? ?Fluid:  1600 ml daily ? ?Colman Cater MS,RD,CSG,LDN ?Contact: AMION  ? ? ?

## 2021-10-24 DIAGNOSIS — I159 Secondary hypertension, unspecified: Secondary | ICD-10-CM | POA: Diagnosis not present

## 2021-10-24 DIAGNOSIS — K861 Other chronic pancreatitis: Secondary | ICD-10-CM | POA: Diagnosis not present

## 2021-10-24 DIAGNOSIS — I482 Chronic atrial fibrillation, unspecified: Secondary | ICD-10-CM | POA: Diagnosis not present

## 2021-10-24 DIAGNOSIS — K559 Vascular disorder of intestine, unspecified: Secondary | ICD-10-CM | POA: Diagnosis not present

## 2021-10-24 LAB — CARDIOLIPIN ANTIBODIES, IGM+IGG
Anticardiolipin IgG: <9 GPL U/mL (ref 0–14)
Anticardiolipin IgM: 11 MPL U/mL (ref 0–12)

## 2021-10-24 LAB — MAGNESIUM: Magnesium: 1.9 mg/dL (ref 1.7–2.4)

## 2021-10-24 LAB — BASIC METABOLIC PANEL
Anion gap: 8 (ref 5–15)
BUN: 11 mg/dL (ref 8–23)
CO2: 23 mmol/L (ref 22–32)
Calcium: 7.9 mg/dL — ABNORMAL LOW (ref 8.9–10.3)
Chloride: 109 mmol/L (ref 98–111)
Creatinine, Ser: 0.93 mg/dL (ref 0.44–1.00)
GFR, Estimated: >60 mL/min (ref >60)
Glucose, Bld: 102 mg/dL — ABNORMAL HIGH (ref 70–99)
Potassium: 4 mmol/L (ref 3.5–5.1)
Sodium: 140 mmol/L (ref 135–145)

## 2021-10-24 LAB — CBC
HCT: 36.6 % (ref 36.0–46.0)
Hemoglobin: 11.5 g/dL — ABNORMAL LOW (ref 12.0–15.0)
MCH: 31.5 pg (ref 26.0–34.0)
MCHC: 31.4 g/dL (ref 30.0–36.0)
MCV: 100.3 fL — ABNORMAL HIGH (ref 80.0–100.0)
Platelets: 445 10*3/uL — ABNORMAL HIGH (ref 150–400)
RBC: 3.65 MIL/uL — ABNORMAL LOW (ref 3.87–5.11)
RDW: 13.7 % (ref 11.5–15.5)
WBC: 8.5 10*3/uL (ref 4.0–10.5)
nRBC: 0 % (ref 0.0–0.2)

## 2021-10-24 LAB — LIPASE, BLOOD: Lipase: 42 U/L (ref 11–51)

## 2021-10-24 MED ORDER — DIGOXIN 0.25 MG/ML IJ SOLN
0.5000 mg | Freq: Once | INTRAMUSCULAR | Status: AC
  Administered 2021-10-24: 0.5 mg via INTRAVENOUS
  Filled 2021-10-24: qty 2

## 2021-10-24 MED ORDER — DIGOXIN 0.25 MG/ML IJ SOLN
0.2500 mg | Freq: Once | INTRAMUSCULAR | Status: AC
  Administered 2021-10-24: 0.25 mg via INTRAVENOUS
  Filled 2021-10-24: qty 2

## 2021-10-24 MED ORDER — LORAZEPAM 0.5 MG PO TABS
0.5000 mg | ORAL_TABLET | Freq: Three times a day (TID) | ORAL | Status: DC | PRN
  Administered 2021-10-24 – 2021-10-26 (×3): 0.5 mg via ORAL
  Filled 2021-10-24 (×4): qty 1

## 2021-10-24 MED ORDER — SODIUM CHLORIDE 0.9 % IV BOLUS
500.0000 mL | Freq: Once | INTRAVENOUS | Status: AC
  Administered 2021-10-24: 500 mL via INTRAVENOUS

## 2021-10-24 NOTE — Consult Note (Signed)
?Cardiology Consultation:  ? ?Patient ID: Shelly Maldonado ?MRN: [id number]; DOB: 05/14/1946 ? ?Admit date: 10/21/2021 ?Date of Consult: 10/24/2021 ? ?PCP:  Toma Deiters, MD ?  ?[facility] Providers ?Cardiologist:  None   { ? ? ?Patient Profile:  ? ?Shelly Maldonado is a 76 y.o. female with a hx of recently diagnosed atrial fibrillation, chronic pancreatitis, HTN who is being seen 10/24/2021 for the evaluation of Afib with RVR at the request of Dr. Kerry Hough. ? ?History of Present Illness:  ? ?Shelly Maldonado is a 76 year old female with history as detailed above who does not follow regularly with Cardiology. She was admitted in [place] from 10/11/2021 to 10/16/2021 for UTI with hospital course complicated by new Afib with RVR. She was started on metoprolol and apixaban. ? ?She presented on this admission with worsening abdominal pain, nausea, diarrhea, cough and SOB found to have ischemic enteritis and lobar pneumonia. In the ER, HR 110-115s but currently going up to the 130s in the ICU. CT of the abdomen and pelvis showed suspicion of jejunal ischemia with abnormal bowel thickening involving multiple loops of the jejunum associated with suspected emboli in the jejunal branches of the SMA.  There is also a small acute splenic infarct.  The patient was started on IV heparin and Zosyn. Presentation thought to be due to embolic phenomena in the setting of Afib. TTE 10/22/21 with normal BiV function, moderate pulmonary HTN, trivial percardial effusion, no significant valve disease, RAP 3. Cardiology is consulted for Afib with RVR. ? ?Currently, the patient states symptoms are improving. States she has been having episodes of SOB, lightheadedness and fatigue for about 1.5 years. Did not have a diagnosis of Afib until her hospitalization but thinks she may have been going in and out of it for a while. No chest pain, orthopnea or PND. States she overall feels better. Continues to have diarrhea.  ? ? ?Past Medical History:   ?Diagnosis Date  ? Atrial fibrillation (HCC)   ? Hypertension   ? Pancreatitis   ? Shingles   ? ? ?Past Surgical History:  ?Procedure Laterality Date  ? CATARACT EXTRACTION Bilateral 2016  ? CHOLECYSTECTOMY    ? COLONOSCOPY N/A 03/04/2018  ? Procedure: COLONOSCOPY;  Surgeon: Malissa Hippo, MD;  Location: [facility];  Service: Endoscopy;  Laterality: N/A;  12:45  ? complete hysterecomy    ?  ? ?Home Medications:  ?Prior to Admission medications   ?Medication Sig Start Date End Date Taking? Authorizing Provider  ?amLODipine (NORVASC) 5 MG tablet Take 5 mg by mouth daily. 10/17/21  Yes [provider]  ?bisoprolol-hydrochlorothiazide (ZIAC) 10-6.25 MG tablet Take 1 tablet by mouth daily.   Yes [provider]  ?ELIQUIS 5 MG TABS tablet Take 5 mg by mouth 2 (two) times daily. 10/16/21  Yes [provider]  ?ibuprofen (ADVIL,MOTRIN) 200 MG tablet Take 200 mg by mouth every 6 (six) hours as needed for moderate pain.   Yes [provider]  ?levocetirizine (XYZAL) 5 MG tablet Take 5 mg by mouth at bedtime. 07/09/21  Yes [provider]  ?metoprolol tartrate (LOPRESSOR) 25 MG tablet Take 25 mg by mouth 2 (two) times daily. 10/16/21  Yes [provider]  ?CREON 36000-114000 units CPEP capsule Take 1 capsule by mouth 3 (three) times daily before meals. ?Patient not taking: Reported on 10/22/2021 10/01/21   [provider]  ? ? ?Inpatient Medications: ?Scheduled Meds: ? budesonide (PULMICORT) nebulizer solution  0.5 mg Nebulization BID  ?  Chlorhexidine Gluconate Cloth  6 each Topical Daily  ? digoxin  0.25 mg Intravenous Once  ? [START ON 10/25/2021] digoxin  0.25 mg Intravenous Once  ? digoxin  0.5 mg Intravenous Once  ? enoxaparin (LOVENOX) injection  80 mg Subcutaneous BID  ? feeding supplement  1 Container Oral TID BM  ? hydrOXYzine  10 mg Oral Once  ? ipratropium  0.5 mg Nebulization TID  ? levalbuterol  0.63 mg Nebulization TID  ? metoprolol tartrate  25 mg Oral  Q6H  ? ?Continuous Infusions: ? 0.9 % NaCl with KCl 40 mEq / L 50 mL/hr at 10/24/21 0809  ? diltiazem (CARDIZEM) infusion 5 mg/hr (10/24/21 [id number])  ? piperacillin-tazobactam (ZOSYN)  IV 12.5 mL/hr at 10/24/21 0807  ? ?PRN Meds: ?acetaminophen **OR** acetaminophen, fentaNYL (SUBLIMAZE) injection, guaiFENesin-dextromethorphan, metoprolol tartrate, ondansetron **OR** ondansetron (ZOFRAN) IV ? ?Allergies:    ?Allergies  ?Allergen Reactions  ? Codeine Other (See Comments)  ?  unknown  ? Morphine And Related   ?  Vein became red  ? ? ?Social History:   ?Social History  ? ?Socioeconomic History  ? Marital status: Divorced  ?  Spouse name: Not on file  ? Number of children: Not on file  ? Years of education: Not on file  ? Highest education level: Not on file  ?Occupational History  ? Not on file  ?Tobacco Use  ? Smoking status: Former  ? Smokeless tobacco: Never  ? Tobacco comments:  ?  smoked 48 yrs ago.   ?Vaping Use  ? Vaping Use: Never used  ?Substance and Sexual Activity  ? Alcohol use: Never  ? Drug use: Never  ? Sexual activity: Not on file  ?Other Topics Concern  ? Not on file  ?Social History Narrative  ? Not on file  ? ?Social Determinants of Health  ? ?Financial Resource Strain: Not on file  ?Food Insecurity: Not on file  ?Transportation Needs: Not on file  ?Physical Activity: Not on file  ?Stress: Not on file  ?Social Connections: Not on file  ?Intimate Partner Violence: Not on file  ?  ?Family History:   ? ?Family History  ?Problem Relation Age of Onset  ? Colon cancer Neg Hx   ?  ? ?ROS:  ?Please see the history of present illness.  ?Review of Systems  ?Constitutional:  Positive for malaise/fatigue.  ?Respiratory:  Positive for cough, sputum production and shortness of breath.   ?Cardiovascular:  Negative for chest pain, palpitations, orthopnea, claudication, leg swelling and PND.  ?Gastrointestinal:  Positive for diarrhea. Negative for blood in stool and melena.  ?Genitourinary:  Negative for hematuria.   ?Neurological:  Positive for dizziness. Negative for loss of consciousness.   ? ?Physical Exam/Data:  ? ?Vitals:  ? 10/24/21 0800 10/24/21 0900 10/24/21 0938 10/24/21 1000  ?BP: 121/84 93/79  126/81  ?Pulse: (!) 115 70 (!) 105 86  ?Resp: 16 (!) 29 20 (!) 25  ?Temp:      ?TempSrc:      ?SpO2: 100% 99% 99% 95%  ?Weight:      ?Height:      ? ? ?Intake/Output Summary (Last 24 hours) at 10/24/2021 1124 ?Last data filed at 10/24/2021 [id number] ?Gross per 24 hour  ?Intake 2005.6 ml  ?Output --  ?Net 2005.6 ml  ? ? ?  10/24/2021  ?  5:00 AM 10/23/2021  ?  7:24 PM 10/21/2021  ?  1:52 PM  ?Last 3 Weights  ?Weight (lbs) 176 lb 5.9 oz 176  lb 5.9 oz 183 lb 8 oz  ?Weight (kg) 80 kg 80 kg 83.235 kg  ?   ?Body mass index is 29.81 kg/m?.  ?General:  Comfortable, laying in bed ?HEENT: normal ?Neck: no JVD ?Vascular: No carotid bruits; Distal pulses 2+ bilaterally ?Cardiac:  Irregularly, irregular, no murmurs ?Lungs:  Rhonchorous on the right. Scattered wheezing ?Abd: soft, nontender, no hepatomegaly  ?Ext: no edema ?Musculoskeletal:  No deformities, BUE and BLE strength normal and equal ?Skin: warm and dry  ?Neuro:  CNs 2-12 intact, no focal abnormalities noted ?Psych:  Normal affect  ? ?EKG:  The EKG was personally reviewed and demonstrates:  Unable to view ECG in system ?Telemetry:  Telemetry was personally reviewed and demonstrates:  Afib with HR 80-120s ? ?Relevant CV Studies: ?TTE 10/22/21: ?IMPRESSIONS  ? 1. Left ventricular ejection fraction, by estimation, is 60 to 65%. The  ?left ventricle has normal function. The left ventricle has no regional  ?wall motion abnormalities. There is mild left ventricular hypertrophy of  ?the basal-septal segment. Left  ?ventricular diastolic function could not be evaluated.  ? 2. Right ventricular systolic function is normal. The right ventricular  ?size is normal. There is moderately elevated pulmonary artery systolic  ?pressure. The estimated right ventricular systolic pressure is 50.1 mmHg.  ? 3.  The pericardial effusion is anterior to the right ventricle.  ? 4. The mitral valve is normal in structure. Trivial mitral valve  ?regurgitation. No evidence of mitral stenosis.  ? 5. The aortic valve is normal in st

## 2021-10-24 NOTE — Progress Notes (Signed)
?Subjective: ?Events of last night noted.  Patient states her breathing is much better.  She denies any significant abdominal pain.  She is continuing to move her bowels. ? ?Objective: ?Vital signs in last 24 hours: ?Temp:  [97.9 ?F (36.6 ?C)-100.3 ?F (37.9 ?C)] 98 ?F (36.7 ?C) (04/13 0700) ?Pulse Rate:  [39-134] 86 (04/13 1000) ?Resp:  [16-38] 25 (04/13 1000) ?BP: (72-153)/(42-106) 126/81 (04/13 1000) ?SpO2:  [95 %-100 %] 95 % (04/13 1000) ?FiO2 (%):  [40 %-50 %] 40 % (04/13 0900) ?Weight:  [80 kg] 80 kg (04/13 0500) ?Last BM Date : 10/23/21 ? ?Intake/Output from previous day: ?04/12 0701 - 04/13 0700 ?In: 2353.3 [P.O.:630; I.V.:1516.5; IV Piggyback:206.9] ?Out: -  ?Intake/Output this shift: ?Total I/O ?In: 132.3 [I.V.:105.8; IV Piggyback:26.5] ?Out: -  ? ?General appearance: alert, cooperative, and no distress ?GI: soft, non-tender; bowel sounds normal; no masses,  no organomegaly ? ?Lab Results:  ?Recent Labs  ?  10/23/21 ?0020 10/24/21 ?0430  ?WBC 11.4* 8.5  ?HGB 11.5* 11.5*  ?HCT 35.1* 36.6  ?PLT 440* 445*  ? ?BMET ?Recent Labs  ?  10/23/21 ?0020 10/24/21 ?0430  ?NA 136 140  ?K 3.5 4.0  ?CL 105 109  ?CO2 19* 23  ?GLUCOSE 100* 102*  ?BUN 7* 11  ?CREATININE 0.84 0.93  ?CALCIUM 7.8* 7.9*  ? ?PT/INR ?No results for input(s): LABPROT, INR in the last 72 hours. ? ?Studies/Results: ?DG CHEST PORT 1 VIEW ? ?Result Date: 10/23/2021 ?CLINICAL DATA:  Dyspnea EXAM: PORTABLE CHEST 1 VIEW COMPARISON:  10/23/2021 FINDINGS: Cardiac shadow is within normal limits. Aortic calcifications are noted. The lungs are well aerated bilaterally. The patchy airspace opacities in the right lung are stable but somewhat obscured by overlying extrinsic artifact. Postsurgical changes in the left shoulder are again seen. IMPRESSION: Stable airspace opacities on the right. Electronically Signed   By: Inez Catalina M.D.   On: 10/23/2021 21:13  ? ?DG CHEST PORT 1 VIEW ? ?Result Date: 10/23/2021 ?CLINICAL DATA:  Shortness of breath history of AFib  and hypertension. EXAM: PORTABLE CHEST 1 VIEW COMPARISON:  Chest radiograph dated October 21, 2021 FINDINGS: The heart size and mediastinal contours are within normal limits. Hazy opacities of the right lung, slightly increased from prior examination concerning for airspace disease. Left lung is clear. Status post left shoulder reverse arthroplasty. IMPRESSION: Hazy opacities of the right middle and right lower lobe concerning for airspace disease. Follow-up examination to resolution is recommended. Electronically Signed   By: Keane Police D.O.   On: 10/23/2021 11:26  ? ?ECHOCARDIOGRAM COMPLETE ? ?Result Date: 10/22/2021 ?   ECHOCARDIOGRAM REPORT   Patient Name:   CHRISSI FACENDA Date of Exam: 10/22/2021 Medical Rec #:  [id number]      Height:       64.5 in Accession #:    [id number]     Weight:       183.5 lb Date of Birth:  [birth year]       BSA:          1.896 m? Patient Age:    76 years       BP:           140/93 mmHg Patient Gender: F              HR:           100 bpm. Exam Location:  [facility] Procedure: 2D Echo, Cardiac Doppler and Color Doppler Indications:    Atrial Fibrillation  History:  Patient has no prior history of Echocardiogram examinations.                 Risk Factors:Hypertension. Images by Lonn Georgia, student.  Sonographer:    Wenda Low Referring Phys: [id number] DAVID TAT  Sonographer Comments: Image acquisition challenging due to respiratory motion. IMPRESSIONS  1. Left ventricular ejection fraction, by estimation, is 60 to 65%. The left ventricle has normal function. The left ventricle has no regional wall motion abnormalities. There is mild left ventricular hypertrophy of the basal-septal segment. Left ventricular diastolic function could not be evaluated.  2. Right ventricular systolic function is normal. The right ventricular size is normal. There is moderately elevated pulmonary artery systolic pressure. The estimated right ventricular systolic pressure is [id number] mmHg.  3. The pericardial  effusion is anterior to the right ventricle.  4. The mitral valve is normal in structure. Trivial mitral valve regurgitation. No evidence of mitral stenosis.  5. The aortic valve is normal in structure. Aortic valve regurgitation is not visualized. No aortic stenosis is present.  6. The inferior vena cava is normal in size with greater than 50% respiratory variability, suggesting right atrial pressure of 3 mmHg. FINDINGS  Left Ventricle: Left ventricular ejection fraction, by estimation, is 60 to 65%. The left ventricle has normal function. The left ventricle has no regional wall motion abnormalities. The left ventricular internal cavity size was normal in size. There is  mild left ventricular hypertrophy of the basal-septal segment. Left ventricular diastolic function could not be evaluated due to atrial fibrillation. Left ventricular diastolic function could not be evaluated. Right Ventricle: The right ventricular size is normal. No increase in right ventricular wall thickness. Right ventricular systolic function is normal. There is moderately elevated pulmonary artery systolic pressure. The tricuspid regurgitant velocity is 3.43 m/s, and with an assumed right atrial pressure of 3 mmHg, the estimated right ventricular systolic pressure is [id number] mmHg. Left Atrium: Left atrial size was normal in size. Right Atrium: Right atrial size was normal in size. Pericardium: Trivial pericardial effusion is present. The pericardial effusion is anterior to the right ventricle. Mitral Valve: The mitral valve is normal in structure. Trivial mitral valve regurgitation. No evidence of mitral valve stenosis. MV peak gradient, 6.7 mmHg. The mean mitral valve gradient is 3.0 mmHg. Tricuspid Valve: The tricuspid valve is normal in structure. Tricuspid valve regurgitation is mild . No evidence of tricuspid stenosis. Aortic Valve: The aortic valve is normal in structure. Aortic valve regurgitation is not visualized. No aortic stenosis is  present. Aortic valve mean gradient measures 2.0 mmHg. Aortic valve peak gradient measures 3.5 mmHg. Aortic valve area, by VTI measures 2.41 cm?. Pulmonic Valve: The pulmonic valve was normal in structure. Pulmonic valve regurgitation is not visualized. No evidence of pulmonic stenosis. Aorta: The aortic root is normal in size and structure. Venous: The inferior vena cava is normal in size with greater than 50% respiratory variability, suggesting right atrial pressure of 3 mmHg. IAS/Shunts: No atrial level shunt detected by color flow Doppler.  LEFT VENTRICLE PLAX 2D LVIDd:         4.20 cm LVIDs:         2.80 cm LV PW:         0.80 cm LV IVS:        1.30 cm LVOT diam:     1.90 cm LV SV:         42 LV SV Index:   22 LVOT Area:     2.84 cm?  RIGHT VENTRICLE RV Basal diam:  2.80 cm RV Mid diam:    2.90 cm LEFT ATRIUM           Index        RIGHT ATRIUM           Index LA diam:      3.50 cm 1.85 cm/m?   RA Area:     15.50 cm? LA Vol (A4C): 68.0 ml 35.86 ml/m?  RA Volume:   38.50 ml  20.30 ml/m?  AORTIC VALVE                    PULMONIC VALVE AV Area (Vmax):    2.43 cm?     PV Vmax:       0.72 m/s AV Area (Vmean):   2.42 cm?     PV Peak grad:  2.1 mmHg AV Area (VTI):     2.41 cm? AV Vmax:           93.60 cm/s AV Vmean:          65.200 cm/s AV VTI:            0.174 m AV Peak Grad:      3.5 mmHg AV Mean Grad:      2.0 mmHg LVOT Vmax:         80.30 cm/s LVOT Vmean:        55.700 cm/s LVOT VTI:          0.148 m LVOT/AV VTI ratio: 0.85  AORTA Ao Root diam: 3.00 cm Ao Asc diam:  3.60 cm MITRAL VALVE               TRICUSPID VALVE MV Area (PHT): 4.12 cm?    TR Peak grad:   47.1 mmHg MV Area VTI:   2.01 cm?    TR Vmax:        343.00 cm/s MV Peak grad:  6.7 mmHg MV Mean grad:  3.0 mmHg    SHUNTS MV Vmax:       1.29 m/s    Systemic VTI:  0.15 m MV Vmean:      81.7 cm/s   Systemic Diam: 1.90 cm MV Decel Time: 184 msec MV E velocity: 86.50 cm/s Fransico Him MD Electronically signed by Fransico Him MD Signature Date/Time:  10/22/2021/4:52:12 PM    Final    ? ?Anti-infectives: ?Anti-infectives (From admission, onward)  ? ? Start     Dose/Rate Route Frequency Ordered Stop  ? 10/21/21 2100  piperacillin-tazobactam (ZOSYN) IVPB 3.375 g

## 2021-10-24 NOTE — Progress Notes (Signed)
?Progress Note ? ? ?Patient: Shelly Maldonado [id number] DOB: 1946/04/25 DOA: 10/21/2021     3 ?DOS: the patient was seen and examined on 10/24/2021 ?  ?Brief hospital course: ?76 year old female with a history of atrial fibrillation, chronic pancreatitis, hypertension, newly diagnosed atrial fibrillation presenting with upper abdominal pain, nausea, and loose stools worsening over the past week.  Notably, the patient had a recent hospitalization in [place] from 10/11/2021 to 10/16/2021.  She was treated for her UTI,  and during that stay she developed new onset atrial fibrillation for which she was started on metoprolol 25 mg twice daily and apixaban.  During that hospital stay, the patient was noted to have diarrhea which was thought to be antibiotic associated, although the patient states that she has had loose stools and abdominal pain intermittently over the last 6 months which has worsened over the past week.  Since discharge from [facility], the patient has had some subjective fevers and chills.  She has also developed a nonproductive cough with worsening shortness of breath.  There is no hemoptysis.  She was scheduled to follow up with a gastroenterologist (for the diarrhea) and a cardiologist (for the afib). She came to the ED because she was unable to tolerate the ongoing diarrhea.  Her sister is at the bedside and reports that Shelly Maldonado has not been eating well and that she has been incontinent of large volumes of liquid stool intermittently. She also has severe intermittent abdominal pain, located in the epigastric area and rated 8-9/10 at worst, she states that eating makes her abdominal pain worse.  She continues to have nausea with intermittent dry heaving. ?In the ED, the patient was afebrile and hemodynamically stable with oxygen saturation 95% room air.  She was tachycardic with heart rate 100-115.  CT of the abdomen and pelvis showed suspicion of the jejunal ischemia with abnormal bowel  thickening involving multiple loops of the jejunum associated with suspected emboli in the jejunal branches of the SMA.  There is also a small acute splenic infarct.  The patient was started on IV heparin and Zosyn.  General surgery was consulted to assist with management. ? 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  Continue supportive care ?-Plan on transitioning back to Eliquis on discharge since this does not appear to be an Eliquis failure.  She was on Eliquis for only 1 week prior to current presentation and underlying thrombosis may have preceded Eliquis. ? ?Splenic infarct ?Etiology is atrial fibrillation ?-check Factor V Leiden, anticardiolipin antibody, prothrombin gene mutation ?-Changed from IV heparin to Lovenox due to elevated PTT levels ?-Judicious opioids ? ?Lobar pneumonia ([place]) ?Personally reviewed CXR--RUL opacity ?-continuing zosyn ?-MRSA screen ?-PCT <0.1 ? ?Chronic atrial fibrillation with RVR (HCC) ?Currently on metoprolol and Cardizem infusion for rate control ?Cardiology consulted since heart rate remains uncontrolled and blood pressures in the 90s ?Anticoagulated with Lovenox ? ?Stage 3a chronic kidney disease (CKD) ([place]) ?Baseline creatinine 0.8-1.0 ? ?E. coli UTI ?She also has dysuria ?-11-20 WBC on UA ?-Urine culture positive for E. coli, follow-up sensitivities ?-already on zosyn ? ?Hypokalemia ?Replaced ? ?Chronic pancreatitis ([place]) ?Continue Creon the patient is able to tolerate p.o. ? ?HTN (hypertension) ?Switch bisoprolol  to metoprolol heart rate for rate control titration for  her atrial fibrillation ? ?Obesity ?BMI 31.0 ?Will need lifestyle changes ? ?  ? ?Subjective: Overall she is feeling better today.  Abdominal pain is better.  She does have a cough.  Feels that her breathing is doing better today as compared to yesterday ? ?Physical Exam: ?Vitals:  ? 10/24/21 1600 10/24/21 1612 10/24/21 1700 10/24/21 1800  ?BP: (!) 150/84  (!) 161/85 (!) 147/99  ?Pulse: 87 91 68 79  ?Resp: (!) 30 16 (!) 24 16  ?Temp:  99.5 ?F (37.5 ?C)    ?TempSrc:  Oral    ?SpO2: 94% 96% 96% 96%  ?Weight:      ?Height:      ? ?General exam: Alert, awake, oriented x 3 ?Respiratory system: Bilateral wheezes. Respiratory effort normal. ?Cardiovascular system: Irregularly irregular no murmurs, rubs, gallops. ?Gastrointestinal system: Abdomen is nondistended, soft and nontender. No organomegaly or masses felt. Normal bowel sounds heard. ?Central nervous system: Alert and oriented. No focal neurological deficits. ?Extremities: No C/C/E, +pedal pulses ?Skin: No rashes, lesions or ulcers ?Psychiatry: Judgement and insight appear normal. Mood & affect appropriate.  ? ? ? ?Family Communication: No family present ? ?Disposition: ?Status is: Inpatient ?Remains inpatient appropriate because: Continued management of rapid atrial fibrillation ? Planned Discharge Destination: Skilled nursing facility ? ? ? ?Time spent: 35 minutes ? ?Author: ?Kathie Dike, MD ?10/24/2021 7:50 PM ? ?For on call review [email or web address].  ?

## 2021-10-25 DIAGNOSIS — I482 Chronic atrial fibrillation, unspecified: Secondary | ICD-10-CM | POA: Diagnosis not present

## 2021-10-25 DIAGNOSIS — K559 Vascular disorder of intestine, unspecified: Secondary | ICD-10-CM | POA: Diagnosis not present

## 2021-10-25 DIAGNOSIS — I159 Secondary hypertension, unspecified: Secondary | ICD-10-CM | POA: Diagnosis not present

## 2021-10-25 DIAGNOSIS — K861 Other chronic pancreatitis: Secondary | ICD-10-CM | POA: Diagnosis not present

## 2021-10-25 LAB — CBC
HCT: 32.6 % — ABNORMAL LOW (ref 36.0–46.0)
Hemoglobin: 10.5 g/dL — ABNORMAL LOW (ref 12.0–15.0)
MCH: 32.3 pg (ref 26.0–34.0)
MCHC: 32.2 g/dL (ref 30.0–36.0)
MCV: 100.3 fL — ABNORMAL HIGH (ref 80.0–100.0)
Platelets: 449 10*3/uL — ABNORMAL HIGH (ref 150–400)
RBC: 3.25 MIL/uL — ABNORMAL LOW (ref 3.87–5.11)
RDW: 13.6 % (ref 11.5–15.5)
WBC: 6.7 10*3/uL (ref 4.0–10.5)
nRBC: 0 % (ref 0.0–0.2)

## 2021-10-25 LAB — BASIC METABOLIC PANEL
Anion gap: 8 (ref 5–15)
BUN: 10 mg/dL (ref 8–23)
CO2: 22 mmol/L (ref 22–32)
Calcium: 7.7 mg/dL — ABNORMAL LOW (ref 8.9–10.3)
Chloride: 107 mmol/L (ref 98–111)
Creatinine, Ser: 0.85 mg/dL (ref 0.44–1.00)
GFR, Estimated: >60 mL/min (ref >60)
Glucose, Bld: 94 mg/dL (ref 70–99)
Potassium: 3.8 mmol/L (ref 3.5–5.1)
Sodium: 137 mmol/L (ref 135–145)

## 2021-10-25 LAB — FACTOR 5 LEIDEN

## 2021-10-25 MED ORDER — AMOXICILLIN-POT CLAVULANATE 875-125 MG PO TABS
1.0000 | ORAL_TABLET | Freq: Two times a day (BID) | ORAL | Status: DC
  Administered 2021-10-25 – 2021-10-26 (×3): 1 via ORAL
  Filled 2021-10-25 (×3): qty 1

## 2021-10-25 MED ORDER — LOPERAMIDE HCL 2 MG PO CAPS
2.0000 mg | ORAL_CAPSULE | Freq: Once | ORAL | Status: AC
  Administered 2021-10-25: 2 mg via ORAL
  Filled 2021-10-25: qty 1

## 2021-10-25 MED ORDER — PANCRELIPASE (LIP-PROT-AMYL) 12000-38000 UNITS PO CPEP
36000.0000 [IU] | ORAL_CAPSULE | Freq: Three times a day (TID) | ORAL | Status: DC
Start: 2021-10-25 — End: 2021-10-26
  Administered 2021-10-25 – 2021-10-26 (×3): 36000 [IU] via ORAL
  Filled 2021-10-25 (×3): qty 3

## 2021-10-25 MED ORDER — APIXABAN 5 MG PO TABS
5.0000 mg | ORAL_TABLET | Freq: Two times a day (BID) | ORAL | Status: DC
Start: 2021-10-25 — End: 2021-10-26
  Administered 2021-10-25 – 2021-10-26 (×2): 5 mg via ORAL
  Filled 2021-10-25 (×2): qty 1

## 2021-10-25 MED ORDER — POTASSIUM CHLORIDE CRYS ER 20 MEQ PO TBCR
40.0000 meq | EXTENDED_RELEASE_TABLET | Freq: Once | ORAL | Status: AC
  Administered 2021-10-25: 40 meq via ORAL
  Filled 2021-10-25: qty 2

## 2021-10-25 NOTE — TOC Progression Note (Signed)
Transition of Care (TOC) - Progression Note  ? ? ?Patient Details  ?Name: Shelly Maldonado ?MRN: [id number] ?Date of Birth: Dec 03, 1945 ? ?Transition of Care (TOC) CM/SW Contact  ?Iona Beard, LCSWA ?Phone Number: ?10/25/2021, 12:52 PM ? ?Clinical Narrative:    ?TOC updated that PT is recommending [facility] PT services at DC. CSW met with pt in room to speak about recommendation. Pt states that she does not feel as if [facility] is needed and declines services at this time. CSW explained that an outpatient PT referral could be made. Pt states she is not interested in this. CSW explained that if pt discharges and feels services are needed to reach out to her PCP to get them set up. TOC signing off.  ? ?  ?Barriers to Discharge: Continued Medical Work up ? ?Expected Discharge Plan and Services ?  ?  ?  ?  ?  ?                ?  ?  ?  ?  ?  ?  ?  ?  ?  ?  ? ? ?Social Determinants of Health (SDOH) Interventions ?  ? ?Readmission Risk Interventions ?   ? View : No data to display.  ?  ?  ?  ? ? ?

## 2021-10-25 NOTE — Progress Notes (Signed)
Patient brought to #[id number]. When orienting patient to the room, asked patient if we could get her standing weight. Patient declined. Spoke with patient about initiative to get to know our patients and if there were any fun facts she wished to share with [place]. Declined. Patient given her call bell. Updated patient's family member at bedside. Provided ice water and warm blankets. Bed in lowest locked position. Patient encouraged to call staff before getting up.  ?

## 2021-10-25 NOTE — Progress Notes (Signed)
?  Subjective: ?Denies any abdominal pain.  Tolerating diet well. ? ?Objective: ?Vital signs in last 24 hours: ?Temp:  [97.6 ?F (36.4 ?C)-99.5 ?F (37.5 ?C)] 98.4 ?F (36.9 ?C) (04/14 0800) ?Pulse Rate:  [43-106] 43 (04/14 0800) ?Resp:  [13-31] 21 (04/14 0800) ?BP: (106-175)/(42-136) 135/55 (04/14 0800) ?SpO2:  [94 %-100 %] 100 % (04/14 0800) ?Last BM Date : 10/23/21 ? ?Intake/Output from previous day: ?04/13 0701 - 04/14 0700 ?In: 557.8 [I.V.:489.5; IV Piggyback:68.3] ?Out: 300 [Urine:300] ?Intake/Output this shift: ?No intake/output data recorded. ? ?General appearance: alert, cooperative, and no distress ?GI: soft, non-tender; bowel sounds normal; no masses,  no organomegaly ? ?Lab Results:  ?Recent Labs  ?  10/24/21 ?0430 10/25/21 ?0234  ?WBC 8.5 6.7  ?HGB 11.5* 10.5*  ?HCT 36.6 32.6*  ?PLT 445* 449*  ? ?BMET ?Recent Labs  ?  10/24/21 ?0430 10/25/21 ?0234  ?NA 140 137  ?K 4.0 3.8  ?CL 109 107  ?CO2 23 22  ?GLUCOSE 102* 94  ?BUN 11 10  ?CREATININE 0.93 0.85  ?CALCIUM 7.9* 7.7*  ? ?PT/INR ?No results for input(s): LABPROT, INR in the last 72 hours. ? ?Studies/Results: ?DG CHEST PORT 1 VIEW ? ?Result Date: 10/23/2021 ?CLINICAL DATA:  Dyspnea EXAM: PORTABLE CHEST 1 VIEW COMPARISON:  10/23/2021 FINDINGS: Cardiac shadow is within normal limits. Aortic calcifications are noted. The lungs are well aerated bilaterally. The patchy airspace opacities in the right lung are stable but somewhat obscured by overlying extrinsic artifact. Postsurgical changes in the left shoulder are again seen. IMPRESSION: Stable airspace opacities on the right. Electronically Signed   By: Alcide Clever M.D.   On: 10/23/2021 21:13  ? ?DG CHEST PORT 1 VIEW ? ?Result Date: 10/23/2021 ?CLINICAL DATA:  Shortness of breath history of AFib and hypertension. EXAM: PORTABLE CHEST 1 VIEW COMPARISON:  Chest radiograph dated October 21, 2021 FINDINGS: The heart size and mediastinal contours are within normal limits. Hazy opacities of the right lung, slightly  increased from prior examination concerning for airspace disease. Left lung is clear. Status post left shoulder reverse arthroplasty. IMPRESSION: Hazy opacities of the right middle and right lower lobe concerning for airspace disease. Follow-up examination to resolution is recommended. Electronically Signed   By: Larose Hires D.O.   On: 10/23/2021 11:26   ? ?Anti-infectives: ?Anti-infectives (From admission, onward)  ? ? Start     Dose/Rate Route Frequency Ordered Stop  ? 10/21/21 2100  piperacillin-tazobactam (ZOSYN) IVPB 3.375 g       ? 3.375 g ?12.5 mL/hr over 240 Minutes Intravenous Every 8 hours 10/21/21 2019    ? 10/21/21 2015  piperacillin-tazobactam (ZOSYN) IVPB 3.375 g  Status:  Discontinued       ? 3.375 g ?100 mL/hr over 30 Minutes Intravenous Every 6 hours 10/21/21 2010 10/21/21 2018  ? 10/21/21 1830  cefTRIAXone (ROCEPHIN) 1 g in sodium chloride 0.9 % 100 mL IVPB       ? 1 g ?200 mL/hr over 30 Minutes Intravenous  Once 10/21/21 1827 10/21/21 2136  ? ?  ? ? ?Assessment/Plan: ?Impression: Patient's diet is being advanced without difficulty.  Her diarrhea seems to be improving.  She has no abdominal pain.  No need for acute surgical intervention at this time.  Can transition to oral anticoagulant.  We will follow peripherally with you. ? LOS: 4 days  ? ? ?Shelly Maldonado ?10/25/2021  ?

## 2021-10-25 NOTE — Progress Notes (Signed)
? ?Primary Cardiology :  Shelly Maldonado ? ?Subjective:  ?Denies SSCP, palpitations or Dyspnea ? ? ?Objective:  ?Vitals:  ? 10/25/21 0600 10/25/21 0700 10/25/21 0748 10/25/21 0751  ?BP: 125/61 137/72    ?Pulse: (!) 55 76    ?Resp: (!) 24 (!) 21    ?Temp:      ?TempSrc:      ?SpO2: 97% 99% 97% 100%  ?Weight:      ?Height:      ? ? ?Intake/Output from previous day: ? ?Intake/Output Summary (Last 24 hours) at 10/25/2021 0815 ?Last data filed at 10/24/2021 1843 ?Gross per 24 hour  ?Intake 425.52 ml  ?Output 300 ml  ?Net 125.52 ml  ? ? ?Physical Exam: ?Affect appropriate ?Healthy:  appears stated age ?HEENT: normal ?Neck supple with no adenopathy ?JVP normal no bruits no thyromegaly ?Lungs clear with no wheezing and good diaphragmatic motion ?Heart:  S1/S2 no murmur, no rub, gallop or click ?PMI normal ?Abdomen: benighn, BS positve, no tenderness, no AAA ?no bruit.  No HSM or HJR ?Distal pulses intact with no bruits ?No edema ?Neuro non-focal ?Skin warm and dry ?No muscular weakness ? ? ?Lab Results: ?Basic Metabolic Panel: ?Recent Labs  ?  10/24/21 ?0430 10/25/21 ?0234  ?NA 140 137  ?K 4.0 3.8  ?CL 109 107  ?CO2 23 22  ?GLUCOSE 102* 94  ?BUN 11 10  ?CREATININE 0.93 0.85  ?CALCIUM 7.9* 7.7*  ?MG 1.9  --   ? ?Liver Function Tests: ?No results for input(s): AST, ALT, ALKPHOS, BILITOT, PROT, ALBUMIN in the last 72 hours. ?Recent Labs  ?  10/24/21 ?0430  ?LIPASE 42  ? ?CBC: ?Recent Labs  ?  10/23/21 ?0020 10/24/21 ?0430 10/25/21 ?0234  ?WBC 11.4* 8.5 6.7  ?NEUTROABS 9.0*  --   --   ?HGB 11.5* 11.5* 10.5*  ?HCT 35.1* 36.6 32.6*  ?MCV 99.4 100.3* 100.3*  ?PLT 440* 445* 449*  ? ? ? ?Imaging: ?DG CHEST PORT 1 VIEW ? ?Result Date: 10/23/2021 ?CLINICAL DATA:  Dyspnea EXAM: PORTABLE CHEST 1 VIEW COMPARISON:  10/23/2021 FINDINGS: Cardiac shadow is within normal limits. Aortic calcifications are noted. The lungs are well aerated bilaterally. The patchy airspace opacities in the right lung are stable but somewhat obscured by overlying  extrinsic artifact. Postsurgical changes in the left shoulder are again seen. IMPRESSION: Stable airspace opacities on the right. Electronically Signed   By: Alcide Clever M.D.   On: 10/23/2021 21:13  ? ?DG CHEST PORT 1 VIEW ? ?Result Date: 10/23/2021 ?CLINICAL DATA:  Shortness of breath history of AFib and hypertension. EXAM: PORTABLE CHEST 1 VIEW COMPARISON:  Chest radiograph dated October 21, 2021 FINDINGS: The heart size and mediastinal contours are within normal limits. Hazy opacities of the right lung, slightly increased from prior examination concerning for airspace disease. Left lung is clear. Status post left shoulder reverse arthroplasty. IMPRESSION: Hazy opacities of the right middle and right lower lobe concerning for airspace disease. Follow-up examination to resolution is recommended. Electronically Signed   By: Larose Hires D.O.   On: 10/23/2021 11:26   ? ?Cardiac Studies: ? ECG:  ?Orders placed or performed during the hospital encounter of 10/21/21  ? EKG 12-lead  ?  ? Telemetry: AFib rates 60-70's  ? Echo: EF 60-65% trivial MR ? ?Medications: ?  ? budesonide (PULMICORT) nebulizer solution  0.5 mg Nebulization BID  ? Chlorhexidine Gluconate Cloth  6 each Topical Daily  ? enoxaparin (LOVENOX) injection  80 mg Subcutaneous BID  ? feeding supplement  1 Container Oral TID BM  ? hydrOXYzine  10 mg Oral Once  ? ipratropium  0.5 mg Nebulization TID  ? levalbuterol  0.63 mg Nebulization TID  ? metoprolol tartrate  25 mg Oral Q6H  ? ?  ? diltiazem (CARDIZEM) infusion Stopped (10/25/21 0049)  ? piperacillin-tazobactam (ZOSYN)  IV 3.375 g (10/25/21 0600)  ? ? ?Assessment/Plan:  ? 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 ?Charlton Haws ?10/25/2021, 8:15 AM ? ? ? ?

## 2021-10-25 NOTE — Progress Notes (Signed)
Pt had o2 off when RT entered room, spo2 96% on room air, o2 at bedside if patient needs it ?

## 2021-10-25 NOTE — Progress Notes (Signed)
Patient more comfortable this evening, provided anxiety med earlier and seemed to help. Patient was also given some imodium for loose stools.  ?

## 2021-10-25 NOTE — Plan of Care (Signed)
?  Problem: Acute Rehab PT Goals(only PT should resolve) ?Goal: Pt Will Go Supine/Side To Sit ?Outcome: Progressing ?Flowsheets (Taken 10/25/2021 1416) ?Pt will go Supine/Side to Sit: with modified independence ?Goal: Patient Will Transfer Sit To/From Stand ?Outcome: Progressing ?Flowsheets (Taken 10/25/2021 1416) ?Patient will transfer sit to/from stand: with modified independence ?Goal: Pt Will Transfer Bed To Chair/Chair To Bed ?Outcome: Progressing ?Flowsheets (Taken 10/25/2021 1416) ?Pt will Transfer Bed to Chair/Chair to Bed: with modified independence ?Goal: Pt Will Ambulate ?Outcome: Progressing ?Flowsheets (Taken 10/25/2021 1416) ?Pt will Ambulate: ? 75 feet ? with modified independence ? with supervision ? with least restrictive assistive device ?  ?2:16 PM, 10/25/21 ?Ocie Bob, MPT ?Physical Therapist with [place] ?[facility] ?[phone] office ?[phone] mobile phone ? ?

## 2021-10-25 NOTE — Progress Notes (Signed)
?Progress Note ? ? ?Patient: Shelly Maldonado [id number] DOB: 1946/05/10 DOA: 10/21/2021     4 ?DOS: the patient was seen and examined on 10/25/2021 ?  ?Brief hospital course: ?76 year old female with a history of atrial fibrillation, chronic pancreatitis, hypertension, newly diagnosed atrial fibrillation presenting with upper abdominal pain, nausea, and loose stools worsening over the past week.  Notably, the patient had a recent hospitalization in [place] from 10/11/2021 to 10/16/2021.  She was treated for her UTI,  and during that stay she developed new onset atrial fibrillation for which she was started on metoprolol 25 mg twice daily and apixaban.  During that hospital stay, the patient was noted to have diarrhea which was thought to be antibiotic associated, although the patient states that she has had loose stools and abdominal pain intermittently over the last 6 months which has worsened over the past week.  Since discharge from [facility], the patient has had some subjective fevers and chills.  She has also developed a nonproductive cough with worsening shortness of breath.  There is no hemoptysis.  She was scheduled to follow up with a gastroenterologist (for the diarrhea) and a cardiologist (for the afib). She came to the ED because she was unable to tolerate the ongoing diarrhea.  Her sister is at the bedside and reports that Shelly Maldonado has not been eating well and that she has been incontinent of large volumes of liquid stool intermittently. She also has severe intermittent abdominal pain, located in the epigastric area and rated 8-9/10 at worst, she states that eating makes her abdominal pain worse.  She continues to have nausea with intermittent dry heaving. ?In the ED, the patient was afebrile and hemodynamically stable with oxygen saturation 95% room air.  She was tachycardic with heart rate 100-115.  CT of the abdomen and pelvis showed suspicion of the jejunal ischemia with abnormal bowel  thickening involving multiple loops of the jejunum associated with suspected emboli in the jejunal branches of the SMA.  There is also a small acute splenic infarct.  The patient was started on IV heparin and Zosyn.  General surgery was consulted to assist with management. ? ?Assessment and Plan: ?* Ischemic enteritis ([place]) ?Etiology is likely atrial fibrillation resulting in thromboembolic event ?-Cardiolipin antibodies negative, lupus anticoagulant not detected, factor V Leiden, prothrombin gene mutation in process ?-10/21/21- CT abdomen as discussed above ?-Continue anticoagulation, currently on Lovenox ?-Continue IV fluids ?-Clear liquid diet ?-Seen by general surgery, operative management not recommended at this time.  Continue supportive care ?-Plan on transitioning back to Eliquis since this does not appear to be an Eliquis failure.  She was on Eliquis for only 1 week prior to current presentation and underlying thrombosis may have preceded Eliquis. ? ?Splenic infarct ?Etiology is atrial fibrillation ?-Initially treated with IV heparin/Lovenox ?-We will transition back to Eliquis ?-Judicious opioids ? ?Lobar pneumonia ([place]) ?Personally reviewed CXR--RUL opacity ?-Transition Zosyn to Augmentin ?-MRSA screen negative ?-PCT <0.1 ?-Her cough is more productive ?-Continue pulmonary hygiene ? ?Chronic atrial fibrillation with RVR (HCC) ?She is being treated with oral metoprolol and Cardizem infusion ?She received digoxin load on 4/13 ?Heart rates improved on 4/14 and Cardizem infusion discontinued ?Appreciate cardiology assistance ?We will continue on metoprolol for now ?If heart rates begin to trend back up, will start Cardizem CD 120 mg daily ?Anticoagulated with Eliquis ? ?Stage 3a chronic kidney disease (CKD) ([place]) ?Baseline creatinine 0.8-1.0 ? ?E. coli UTI ?She also has dysuria ?-11-20 WBC on UA ?-Urine culture positive for  E. coli, follow-up sensitivities ?-already on  antibiotics ? ?Hypokalemia ?Replaced ? ?Chronic pancreatitis ([place]) ?Continue Creon ? ?HTN (hypertension) ?Switched bisoprolol to metoprolol heart rate for rate control titration for her atrial fibrillation ? ?Obesity ?BMI 31.0 ?Will need lifestyle changes ? ?  ? ?Subjective: Cough is more productive.  Heart rate better controlled.  She is feeling better. ? ?Physical Exam: ?Vitals:  ? 10/25/21 0700 10/25/21 0748 10/25/21 0751 10/25/21 0800  ?BP: 137/72   (!) 135/55  ?Pulse: 76   (!) 43  ?Resp: (!) 21   (!) 21  ?Temp:    98.4 ?F (36.9 ?C)  ?TempSrc:    Oral  ?SpO2: 99% 97% 100% 100%  ?Weight:      ?Height:      ? ?General exam: Alert, awake, oriented x 3 ?Respiratory system: Bilateral wheezes. Respiratory effort normal. ?Cardiovascular system: Irregularly irregular no murmurs, rubs, gallops. ?Gastrointestinal system: Abdomen is nondistended, soft and nontender. No organomegaly or masses felt. Normal bowel sounds heard. ?Central nervous system: Alert and oriented. No focal neurological deficits. ?Extremities: No C/C/E, +pedal pulses ?Skin: No rashes, lesions or ulcers ?Psychiatry: Judgement and insight appear normal. Mood & affect appropriate.  ? ? ? ?Family Communication: No family present. Offered to call family but patient said it was not necessary ? ?Disposition: ?Status is: Inpatient ?Remains inpatient appropriate because: Continued management of rapid atrial fibrillation ? Planned Discharge Destination: Skilled nursing facility ? ? ? ?Time spent: 35 minutes ? ?Author: ?Kathie Dike, MD ?10/25/2021 12:45 PM ? ?For on call review [email or web address].  ?

## 2021-10-25 NOTE — Evaluation (Signed)
Physical Therapy Evaluation ?Patient Details ?Name: Shelly Maldonado ?MRN: [id number] ?DOB: 07/20/1945 ?Today's Date: 10/25/2021 ? ?History of Present Illness ? Shelly Maldonado is a 76 y.o. female with medical history significant of chronic pancreatitis, newly diagnosed atrial fibrillation, HTN and h/o shingles who was recently hospitalized at another facility in [place] x 1 week for UTI with hospital course complicated by new afib.  During that hospital stay, she was noted to have diarrhea which was thought to be antibiotic associated (although Shelly Maldonado tells me she has had the diarrhea for many months). She was scheduled to follow up with a gastroenterologist (for the diarrhea) and a cardiologist (for the afib). She came to the ED because she was unable to tolerate the ongoing diarrhea.  Her sister is at the bedside and reports that Shelly Maldonado has not been eating well and that she has been incontinent of large volumes of liquid stool intermittently. She also has severe intermittent abdominal pain, located in the epigastric area and rated 8-9/10 at worst, associated with restlessness that is alleviated by moving her bowels though the pain relief is short-lived. If she eats or drinks anything, the pain returns and is associated with nausea and dry heaves. She is unable to sleep due to the pain. No associated melena or hematochezia. She has tried Tylenol for the pain, but it has not been helpful.  Opioid pain medications cause her to be "loopy."  Also reports a new cough since her recent hospitalization. No fever, but reports she has "chills all the time." Cough is non-productive. No associated sore throat. Still reporting burning pain with urination. ?  ?Clinical Impression ? Patient demonstrates labored movement for sitting up at bedside with HOB flat, good return for transferring to/from chair and commode, slow labored cadence with decreased LUE arm swing with improvement after verbal cueing and limited for  ambulation mostly due to c/o fatigue.  Patient tolerated sitting up in chair after therapy with her sister present in room - RN notified.  Patient will benefit from continued skilled physical therapy in hospital and recommended venue below to increase strength, balance, endurance for safe ADLs and gait.  ?   ?   ? ?Recommendations for follow up therapy are one component of a multi-disciplinary discharge planning process, led by the attending physician.  Recommendations may be updated based on patient status, additional functional criteria and insurance authorization. ? ?Follow Up Recommendations Home health PT ? ?  ?Assistance Recommended at Discharge Set up Supervision/Assistance  ?Patient can return home with the following ? A little help with walking and/or transfers;A little help with bathing/dressing/bathroom;Help with stairs or ramp for entrance;Assistance with cooking/housework ? ?  ?Equipment Recommendations None recommended by PT  ?Recommendations for Other Services ?    ?  ?Functional Status Assessment Patient has had a recent decline in their functional status and demonstrates the ability to make significant improvements in function in a reasonable and predictable amount of time.  ? ?  ?Precautions / Restrictions Precautions ?Precautions: Fall ?Restrictions ?Weight Bearing Restrictions: No  ? ?  ? ?Mobility ? Bed Mobility ?Overal bed mobility: Needs Assistance ?Bed Mobility: Supine to Sit ?  ?  ?Supine to sit: Supervision ?  ?  ?General bed mobility comments: increased time, labored movement ?  ? ?Transfers ?Overall transfer level: Needs assistance ?Equipment used: None, 1 person hand held assist ?Transfers: Sit to/from Stand, Bed to chair/wheelchair/BSC ?Sit to Stand: Supervision, Min guard ?  ?Step pivot transfers: Supervision, Min guard ?  ?  ?  ?  General transfer comment: slow labored movement without loss of balance ?  ? ?Ambulation/Gait ?Ambulation/Gait assistance: Supervision, Min guard ?Gait  Distance (Feet): 25 Feet ?Assistive device: None ?Gait Pattern/deviations: Decreased step length - right, Decreased step length - left, Decreased stride length ?Gait velocity: decreased ?  ?  ?General Gait Details: slow labored cadence with decreased LUE arm swing, no loss of balance, limited mostly due to c/o fatigue ? ?Stairs ?  ?  ?  ?  ?  ? ?Wheelchair Mobility ?  ? ?Modified Rankin (Stroke Patients Only) ?  ? ?  ? ?Balance Overall balance assessment: Needs assistance ?Sitting-balance support: Feet supported, No upper extremity supported ?Sitting balance-Leahy Scale: Good ?Sitting balance - Comments: seated at EOB ?  ?Standing balance support: During functional activity, No upper extremity supported ?Standing balance-Leahy Scale: Fair ?Standing balance comment: without AD ?  ?  ?  ?  ?  ?  ?  ?  ?  ?  ?  ?   ? ? ? ?Pertinent Vitals/Pain Pain Assessment ?Pain Assessment: No/denies pain  ? ? ?Home Living Family/patient expects to be discharged to:: Private residence ?Living Arrangements: Alone ?Available Help at Discharge: Family;Available PRN/intermittently ?Type of Home: House ?Home Access: Stairs to enter ?Entrance Stairs-Rails: Right;Left;Can reach both ?Entrance Stairs-Number of Steps: 3-4 ?  ?Home Layout: One level ?Home Equipment: Grab bars - tub/shower;Rolling Walker (2 wheels);Cane - single point ?   ?  ?Prior Function Prior Level of Function : Independent/Modified Independent ?  ?  ?  ?  ?  ?  ?Mobility Comments: household and short distanced community ambulator without AD, occasionally drives ?ADLs Comments: assisted by her sister who lives next door ?  ? ? ?Hand Dominance  ?   ? ?  ?Extremity/Trunk Assessment  ? Upper Extremity Assessment ?Upper Extremity Assessment: LUE deficits/detail ?LUE Deficits / Details: grossly 4-/5, appears slightly deformed which is baseline ?LUE Sensation: WNL ?LUE Coordination: WNL ?  ? ?Lower Extremity Assessment ?Lower Extremity Assessment: Generalized weakness ?   ? ?Cervical / Trunk Assessment ?Cervical / Trunk Assessment: Normal  ?Communication  ? Communication: No difficulties  ?Cognition Arousal/Alertness: Awake/alert ?Behavior During Therapy: [facility] for tasks assessed/performed ?Overall Cognitive Status: Within Functional Limits for tasks assessed ?  ?  ?  ?  ?  ?  ?  ?  ?  ?  ?  ?  ?  ?  ?  ?  ?  ?  ?  ? ?  ?General Comments   ? ?  ?Exercises    ? ?Assessment/Plan  ?  ?PT Assessment Patient needs continued PT services  ?PT Problem List Decreased strength;Decreased activity tolerance;Decreased balance;Decreased mobility ? ?   ?  ?PT Treatment Interventions DME instruction;Gait training;Stair training;Functional mobility training;Therapeutic activities;Therapeutic exercise;Patient/family education;Balance training   ? ?PT Goals (Current goals can be found in the Care Plan section)  ?Acute Rehab PT Goals ?Patient Stated Goal: return home with family to assist ?PT Goal Formulation: With patient/family ?Time For Goal Achievement: 11/01/21 ?Potential to Achieve Goals: Good ? ?  ?Frequency Min 3X/week ?  ? ? ?Co-evaluation   ?  ?  ?  ?  ? ? ?  ?AM-PAC PT "6 Clicks" Mobility  ?Outcome Measure Help needed turning from your back to your side while in a flat bed without using bedrails?: None ?Help needed moving from lying on your back to sitting on the side of a flat bed without using bedrails?: A Little ?Help needed moving to and from a  bed to a chair (including a wheelchair)?: A Little ?Help needed standing up from a chair using your arms (e.g., wheelchair or bedside chair)?: A Little ?Help needed to walk in hospital room?: A Little ?Help needed climbing 3-5 steps with a railing? : A Little ?6 Click Score: 19 ? ?  ?End of Session   ?Activity Tolerance: Patient tolerated treatment well;Patient limited by fatigue ?Patient left: in chair;with call bell/phone within reach;with family/visitor present ?Nurse Communication: Mobility status ?PT Visit Diagnosis: Unsteadiness on feet  (R26.81);Other abnormalities of gait and mobility (R26.89);Muscle weakness (generalized) (M62.81) ?  ? ?Time: [id number] ?PT Time Calculation (min) (ACUTE ONLY): 28 min ? ? ?Charges:   PT Evaluation ?$PT Eval Moderate Complex

## 2021-10-26 DIAGNOSIS — I482 Chronic atrial fibrillation, unspecified: Secondary | ICD-10-CM | POA: Diagnosis not present

## 2021-10-26 DIAGNOSIS — K559 Vascular disorder of intestine, unspecified: Secondary | ICD-10-CM | POA: Diagnosis not present

## 2021-10-26 DIAGNOSIS — K55059 Acute (reversible) ischemia of intestine, part and extent unspecified: Secondary | ICD-10-CM | POA: Diagnosis not present

## 2021-10-26 DIAGNOSIS — K861 Other chronic pancreatitis: Secondary | ICD-10-CM | POA: Diagnosis not present

## 2021-10-26 LAB — CBC
HCT: 33.5 % — ABNORMAL LOW (ref 36.0–46.0)
Hemoglobin: 10.7 g/dL — ABNORMAL LOW (ref 12.0–15.0)
MCH: 30.9 pg (ref 26.0–34.0)
MCHC: 31.9 g/dL (ref 30.0–36.0)
MCV: 96.8 fL (ref 80.0–100.0)
Platelets: 514 10*3/uL — ABNORMAL HIGH (ref 150–400)
RBC: 3.46 MIL/uL — ABNORMAL LOW (ref 3.87–5.11)
RDW: 13.4 % (ref 11.5–15.5)
WBC: 6.3 10*3/uL (ref 4.0–10.5)
nRBC: 0 % (ref 0.0–0.2)

## 2021-10-26 LAB — URINE CULTURE: Culture: >=100000 — AB

## 2021-10-26 MED ORDER — HYDROCOD POLI-CHLORPHE POLI ER 10-8 MG/5ML PO SUER: 5.0000 mL | Freq: Two times a day (BID) | ORAL | 0 refills | Status: DC | PRN

## 2021-10-26 MED ORDER — HYDROCOD POLI-CHLORPHE POLI ER 10-8 MG/5ML PO SUER
5.0000 mL | Freq: Two times a day (BID) | ORAL | Status: DC | PRN
  Administered 2021-10-26: 5 mL via ORAL
  Filled 2021-10-26: qty 5

## 2021-10-26 MED ORDER — METOPROLOL TARTRATE 50 MG PO TABS: 50.0000 mg | ORAL_TABLET | Freq: Two times a day (BID) | ORAL | 1 refills | Status: DC

## 2021-10-26 MED ORDER — NITROFURANTOIN MACROCRYSTAL 100 MG PO CAPS: 100.0000 mg | ORAL_CAPSULE | Freq: Two times a day (BID) | ORAL | 0 refills | Status: DC

## 2021-10-26 MED ORDER — LOPERAMIDE HCL 2 MG PO CAPS: 2.0000 mg | ORAL_CAPSULE | Freq: Four times a day (QID) | ORAL | 0 refills | Status: DC | PRN

## 2021-10-26 MED ORDER — ALBUTEROL SULFATE HFA 108 (90 BASE) MCG/ACT IN AERS: 2.0000 | INHALATION_SPRAY | Freq: Four times a day (QID) | RESPIRATORY_TRACT | 2 refills | Status: DC | PRN

## 2021-10-26 NOTE — Progress Notes (Signed)
Patient discharged to home, AVS reviewed and all questions answered. Patient is aware that she will need to follow  up with GI and Cardiology and will schedule her appointments. Prescriptions sent to the patient's pharmacy. IVs removed, patient left with all of her belongings. Family provided transportation  ?

## 2021-10-26 NOTE — Discharge Summary (Signed)
Physician Discharge Summary  ?Tava Mohs [id number] DOB: Jun 02, 1946 DOA: 10/21/2021 ? ?PCP: Neale Burly, MD ? ?Admit date: 10/21/2021 ?Discharge date: 10/26/2021 ? ?Admitted From: home ?Disposition:  home ? ?Recommendations for Outpatient Follow-up:  ?Follow up with PCP in 1-2 weeks ?Please obtain BMP/CBC in one week ?Outpatient referral to cardiology has been made ?Outpatient referral to GI has been made ? ?Home Health: Home health PT recommended, but patient refused ?Equipment/Devices: ? ?Discharge Condition: Stable ?CODE STATUS: Full code ?Diet recommendation: Heart healthy ? ?Brief/Interim Summary: ?76 year old female with a history of atrial fibrillation, chronic pancreatitis, hypertension, newly diagnosed atrial fibrillation presenting with upper abdominal pain, nausea, and loose stools worsening over the past week.  Notably, the patient had a recent hospitalization in [place] from 10/11/2021 to 10/16/2021.  She was treated for her UTI,  and during that stay she developed new onset atrial fibrillation for which she was started on metoprolol 25 mg twice daily and apixaban.  During that hospital stay, the patient was noted to have diarrhea which was thought to be antibiotic associated, although the patient states that she has had loose stools and abdominal pain intermittently over the last 6 months which has worsened over the past week.  Since discharge from [facility], the patient has had some subjective fevers and chills.  She has also developed a nonproductive cough with worsening shortness of breath.  There is no hemoptysis.  She was scheduled to follow up with a gastroenterologist (for the diarrhea) and a cardiologist (for the afib). She came to the ED because she was unable to tolerate the ongoing diarrhea.  Her sister is at the bedside and reports that Mrs. Avallone has not been eating well and that she has been incontinent of large volumes of liquid stool intermittently. She also has severe  intermittent abdominal pain, located in the epigastric area and rated 8-9/10 at worst, she states that eating makes her abdominal pain worse.  She continues to have nausea with intermittent dry heaving. ?In the ED, the patient was afebrile and hemodynamically stable with oxygen saturation 95% room air.  She was tachycardic with heart rate 100-115.  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  There is also a small acute splenic infarct.  The patient was started on IV heparin and Zosyn.  General surgery was consulted to assist with management. ? ?Discharge Diagnoses:  ?Principal Problem: ?  Ischemic enteritis ([place]) ?Active Problems: ?  Splenic infarct ?  Lobar pneumonia ([place]) ?  Chronic atrial fibrillation with RVR (HCC) ?  Stage 3a chronic kidney disease (CKD) ([place]) ?  HTN (hypertension) ?  Chronic pancreatitis ([place]) ?  Hypokalemia ?  E. coli UTI ?  Malnutrition of moderate degree ?  Obesity (BMI 30-39.9) ? 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  Continue supportive care ?-Plan on transitioning back to Eliquis since this does not appear to be an Eliquis failure.  She was on Eliquis for only 1 week prior to current presentation and underlying thrombosis may have preceded Eliquis. ?  ?Splenic infarct ?Etiology is atrial fibrillation ?-Initially treated  with IV heparin/Lovenox ?-We will transition back to Eliquis ?-Judicious opioids ?  ?Lobar pneumonia (HCC) ?Personally  reviewed CXR--RUL opacity ?-Transition Zosyn to Augmentin ?-She completed antibiotics in the hospital ?-MRSA screen negative ?-PCT <0.1 ?-Her cough is more productive ?-Continue pulmonary hygiene ?  ?Chronic atrial fibrillation with RVR (HCC) ?She is being treated with oral metoprolol and Cardizem infusion ?She received digoxin load on 4/13 ?Heart rates improved on 4/14 and Cardizem infusion discontinued ?Appreciate cardiology assistance ?Metoprolol dose changed to 50 mg twice daily ?Anticoagulated with Eliquis ?Outpatient follow-up with cardiology has been scheduled ?  ?Stage 3a chronic kidney disease (CKD) (HCC) ?Baseline creatinine 0.8-1.0 ?Creatinine currently at baseline ?  ?E. coli UTI ?She also has dysuria ?-11-20 WBC on UA ?-Urine culture positive for E. coli ?-Based on sensitivities, she received a prescription for 5-day course of Macrobid ?  ?Hypokalemia ?Replaced ?  ?Chronic pancreatitis (HCC) ?Continue Creon ?  ?HTN (hypertension) ?Switched bisoprolol to metoprolol heart rate for rate control titration for her atrial fibrillation ?  ?Obesity ?BMI 31.0 ?Will need lifestyle changes ? ?Discharge Instructions ? ?Discharge Instructions   ? ? Ambulatory referral to Gastroenterology   Complete by: As directed ?  ? What is the reason for referral?: Other Comment - chronic pancreatitis  ? Diet - low sodium heart healthy   Complete by: As directed ?  ? Increase activity slowly   Complete by: As directed ?  ? ?  ? ?Allergies as of 10/26/2021   ? ?   Reactions  ? Codeine Other (See Comments)  ? unknown  ? Morphine And Related   ? Vein became red  ? ?  ? ?  ?Medication List  ?  ? ?STOP taking these medications   ? ?amLODipine 5 MG tablet ?Commonly known as: NORVASC ?  ?bisoprolol-hydrochlorothiazide 10-6.25 MG tablet ?Commonly known as: ZIAC ?  ?ibuprofen 200 MG tablet ?Commonly known as: ADVIL ?  ? ?  ? ?TAKE these medications   ? ?albuterol 108 (90 Base) MCG/ACT inhaler ?Commonly known as: VENTOLIN HFA ?Inhale 2  puffs into the lungs every 6 (six) hours as needed for wheezing or shortness of breath. ?  ?chlorpheniramine-HYDROcodone 10-8 MG/5ML ?Take 5 mLs by mouth every 12 (twelve) hours as needed for cough. ?  ?Creon 36000 UNITS Cpep capsule ?Generic drug: lipase/protease/amylase ?Take 1 capsule by mouth 3 (three) times daily before meals. ?  ?Eliquis 5 MG Tabs tablet ?Generic drug: apixaban ?Take 5 mg by mouth 2 (two) times daily. ?  ?levocetirizine 5 MG tablet ?Commonly known as: XYZAL ?Take 5 mg by mouth at bedtime. ?  ?loperamide 2 MG capsule ?Commonly known as: IMODIUM ?Take 1 capsule (2 mg total) by mouth every 6 (six) hours as needed for diarrhea or loose stools. ?  ?metoprolol tartrate 50 MG tablet ?Commonly known as: LOPRESSOR ?Take 1 tablet (50 mg total) by mouth 2 (two) times daily. ?What changed:  ?medication strength ?how much to take ?  ?nitrofurantoin 100 MG capsule ?Commonly known as: MACRODANTIN ?Take 1 capsule (100 mg total) by mouth 2 (two) times daily for 7 days. ?  ? ?  ? ? Follow-up Information   ? ? Dyann Kief, PA-C Follow up on 11/18/2021.   ?Specialty: Cardiology ?Why: Cardiology Hospital Follow-up on 11/18/2021 at 1:00 PM. ?Contact information: ?[address] ?[place] [address] ?[phone] ? ? ?  ?  ? ? [facility] Follow up.   ?Why: office will contact you with appointment ?Contact information: ?[address] ?[place] ?[phone] ? ?  ?  ? ?  ?  ? ?  ? ?Allergies  ?Allergen Reactions  ? Codeine Other (See Comments)  ?  unknown  ? Morphine And Related   ?  Vein became red  ? ? ?Consultations: ?General surgery ?Cardiology ? ? ?Procedures/Studies: ?CT ABDOMEN PELVIS W CONTRAST ? ?Result Date: 10/21/2021 ?CLINICAL DATA:  Left upper quadrant abdominal pain. Nausea, vomiting, and diarrhea for 3 days. EXAM: CT ABDOMEN AND PELVIS WITH CONTRAST TECHNIQUE: Multidetector CT imaging of the abdomen and pelvis was performed using the standard  protocol following bolus administration of intravenous contrast. RADIATION DOSE REDUCTION: This exam was performed according to the departmental dose-optimization program which includes automated exposure control, a

## 2021-10-26 NOTE — Progress Notes (Signed)
SATURATION QUALIFICATIONS: (This note is used to comply with regulatory documentation for home oxygen)  Patient Saturations on Room Air at Rest = 96%  Patient Saturations on Room Air while Ambulating = 99%   

## 2021-10-26 NOTE — Progress Notes (Signed)
Patient has a persistent congested cough despite PRN robitussin. MD ordered tussionex.  ?Patient refuses ambulating pulse ox at this time d/t cough. She is aware this assessment needs to be competed prior to MD putting in discharge orders.  ?

## 2021-10-28 LAB — PROTHROMBIN GENE MUTATION

## 2021-10-30 ENCOUNTER — Encounter: Payer: Self-pay | Admitting: Internal Medicine

## 2021-10-30 ENCOUNTER — Other Ambulatory Visit: Payer: Self-pay

## 2021-10-30 ENCOUNTER — Ambulatory Visit (INDEPENDENT_AMBULATORY_CARE_PROVIDER_SITE_OTHER): Payer: Medicare Other | Admitting: Internal Medicine

## 2021-10-30 VITALS — BP 128/64 | HR 80 | Temp 97.4°F | Ht 64.0 in | Wt 177.6 lb

## 2021-10-30 DIAGNOSIS — I749 Embolism and thrombosis of unspecified artery: Secondary | ICD-10-CM

## 2021-10-30 DIAGNOSIS — K861 Other chronic pancreatitis: Secondary | ICD-10-CM

## 2021-10-30 DIAGNOSIS — D735 Infarction of spleen: Secondary | ICD-10-CM | POA: Diagnosis not present

## 2021-10-30 DIAGNOSIS — R197 Diarrhea, unspecified: Secondary | ICD-10-CM | POA: Diagnosis not present

## 2021-10-30 DIAGNOSIS — Z86718 Personal history of other venous thrombosis and embolism: Secondary | ICD-10-CM

## 2021-10-30 NOTE — Progress Notes (Signed)
B

## 2021-10-30 NOTE — Patient Instructions (Addendum)
I am happy to hear that you are feeling better since your most recent hospitalization. ? ?In regards to the blood clots, I am going to refer you to Dr. Kirtland Bouchard of oncology.  I want to make sure that you do not have an underlying clotting disorder.  These could be related to your atrial fibrillation.  Continue on Eliquis. ? ?Agree with follow-up with cardiology in regards to your atrial fibrillation. ? ?I will reach out to the advanced endoscopy physicians in [facility] in regards to your pancreas to see if further imaging warranted. ? ?I will also reach out to Dr. Lovell Sheehan to ensure that we do not need to order any follow-up imaging of your bowel. ? ?For your chronic diarrhea, I want you to increase your Creon to 2 capsules with first bite of each meal and 1 capsule with any snack.  You should see improvement with your diarrhea and abdominal pain within a few days.  Please call our office and let us know.  If no improvement, then we will stop this medication. ? ?I will print out low FODMAP diet for you in regards to your diarrhea as well. ? ?Finish your course of antibiotics for pneumonia.  Recommend follow-up with your PCP in regards to this. ? ?Otherwise follow-up in 6 to 8 weeks. ? ?It was very nice meeting both of you today. ? ?Dr. Marletta Lor ? ?At [facility] we value your feedback. You may receive a survey about your visit today. Please share your experience as we strive to create trusting relationships with our patients to provide genuine, compassionate, quality care. ? ?We appreciate your understanding and patience as we review any laboratory studies, imaging, and other diagnostic tests that are ordered as we care for you. Our office policy is 5 business days for review of these results, and any emergent or urgent results are addressed in a timely manner for your best interest. If you do not hear from our office in 1 week, please contact us.  ? ?We also encourage the use of MyChart, which contains your  medical information for your review as well. If you are not enrolled in this feature, an access code is on this after visit summary for your convenience. Thank you for allowing [place] to be involved in your care. ? ?It was great to see you today!  I hope you have a great rest of your Spring! ? ? ? ?Hennie Duos. Marletta Lor, D.O. ?Gastroenterology and Hepatology ?[facility] ? ?

## 2021-11-01 ENCOUNTER — Inpatient Hospital Stay (HOSPITAL_COMMUNITY): Payer: Medicare Other | Attending: Hematology | Admitting: Hematology

## 2021-11-01 VITALS — BP 132/87 | HR 80 | Temp 98.5°F | Resp 18 | Ht 64.0 in | Wt 177.5 lb

## 2021-11-01 DIAGNOSIS — I4891 Unspecified atrial fibrillation: Secondary | ICD-10-CM | POA: Insufficient documentation

## 2021-11-01 DIAGNOSIS — K529 Noninfective gastroenteritis and colitis, unspecified: Secondary | ICD-10-CM | POA: Insufficient documentation

## 2021-11-01 DIAGNOSIS — Z7901 Long term (current) use of anticoagulants: Secondary | ICD-10-CM | POA: Insufficient documentation

## 2021-11-01 DIAGNOSIS — D735 Infarction of spleen: Secondary | ICD-10-CM | POA: Insufficient documentation

## 2021-11-01 NOTE — Progress Notes (Signed)
? ?[facility] ?[address] ?[place], [place] [address] ? ? ?CLINIC:  ?Medical Oncology/Hematology ? ?Patient Care Team: ?Neale Burly, MD as PCP - General (Internal Medicine) ?Derek Jack, MD as Medical Oncologist (Hematology) ? ?CHIEF COMPLAINTS/PURPOSE OF CONSULTATION:  ?Evaluation for acute emboli of SMA. ? ?HISTORY OF PRESENTING ILLNESS:  ?Shelly Maldonado 76 y.o. female is here because of evaluation of h/o blood clots, at the request of [facility]. ? ?Today she reports feeling fair, and she is accompanied by her sister. She presented to the ED on 4/10 with severe diarrhea; her sister reports she was found to have pneumonia in her right lung. She has had diarrhea for 3-4 years since her diagnosis of pancreatitis. At that time she had LUQ abdominal pain, but she reports she does not have that pain today. She denies prior history of DVT and PE. She was placed on Eliquis in March due to her new diagnosis of A-fib, and she denies taking any blood thinners prior to this. She denies history of miscarriages. She report severe fatigue which prevents her from walking long distances. She denies history of CVA and MI. Her appetite is poor.  ? ?She currently lives at home on her own, but her sister has been staying with her since her hospitalization in April. She is able to do her typical home activities independently although she reports her activities have been limited due to the diarrhea. 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 She quit smoking 45-50 years ago. She denies alcohol consumption. She denies family history of lupus anticoagulant and miscarriage. Her mother had non Hodgkin's lymphoma, her maternal grandmother had stomach cancer, and 3 maternal uncles had cancer of unknown types.  ? ?MEDICAL HISTORY:  ?Past Medical History:  ?Diagnosis Date  ? Atrial fibrillation ([place])   ? Hypertension   ? Pancreatitis   ? Shingles   ? ? ?SURGICAL HISTORY: ?Past Surgical History:   ?Procedure Laterality Date  ? CATARACT EXTRACTION Bilateral 2016  ? CHOLECYSTECTOMY    ? COLONOSCOPY N/A 03/04/2018  ? Procedure: COLONOSCOPY;  Surgeon: Rogene Houston, MD;  Location: [facility];  Service: Endoscopy;  Laterality: N/A;  12:45  ? complete hysterecomy    ? ? ?SOCIAL HISTORY: ?Social History  ? ?Socioeconomic History  ? Marital status: Divorced  ?  Spouse name: Not on file  ? Number of children: Not on file  ? Years of education: Not on file  ? Highest education level: Not on file  ?Occupational History  ? Not on file  ?Tobacco Use  ? Smoking status: Former  ? Smokeless tobacco: Never  ? Tobacco comments:  ?  smoked 48 yrs ago.   ?Vaping Use  ? Vaping Use: Never used  ?Substance and Sexual Activity  ? Alcohol use: Never  ? Drug use: Never  ? Sexual activity: Not on file  ?Other Topics Concern  ? Not on file  ?Social History Narrative  ? Not on file  ? ?Social Determinants of Health  ? ?Financial Resource Strain: Not on file  ?Food Insecurity: Not on file  ?Transportation Needs: Not on file  ?Physical Activity: Not on file  ?Stress: Not on file  ?Social Connections: Not on file  ?Intimate Partner Violence: Not on file  ? ? ?FAMILY HISTORY: ?Family History  ?Problem Relation Age of Onset  ? Colon cancer Neg Hx   ? ? ?ALLERGIES:  is allergic to codeine and morphine and related. ? ?MEDICATIONS:  ?Current  Outpatient Medications  ?Medication Sig Dispense Refill  ? albuterol (VENTOLIN HFA) 108 (90 Base) MCG/ACT inhaler Inhale 2 puffs into the lungs every 6 (six) hours as needed for wheezing or shortness of breath. 8 g 2  ? chlorpheniramine-HYDROcodone 10-8 MG/5ML Take 5 mLs by mouth every 12 (twelve) hours as needed for cough. 115 mL 0  ? CREON 36000-114000 units CPEP capsule Take 1 capsule by mouth 3 (three) times daily before meals.    ? ELIQUIS 5 MG TABS tablet Take 5 mg by mouth 2 (two) times daily.    ? levocetirizine (XYZAL) 5 MG tablet Take 5 mg by mouth at bedtime.    ? loperamide (IMODIUM) 2 MG  capsule Take 1 capsule (2 mg total) by mouth every 6 (six) hours as needed for diarrhea or loose stools. 30 capsule 0  ? metoprolol tartrate (LOPRESSOR) 50 MG tablet Take 1 tablet (50 mg total) by mouth 2 (two) times daily. 60 tablet 1  ? nitrofurantoin, macrocrystal-monohydrate, (MACROBID) 100 MG capsule Take 100 mg by mouth 2 (two) times daily.    ? ?No current facility-administered medications for this visit.  ? ? ?REVIEW OF SYSTEMS:   ?Review of Systems  ?Constitutional:  Positive for appetite change and fatigue.  ?HENT:   Positive for trouble swallowing.   ?Respiratory:  Positive for cough and shortness of breath.   ?Gastrointestinal:  Positive for diarrhea. Negative for abdominal pain.  ?Neurological:  Positive for dizziness.  ?Psychiatric/Behavioral:  Positive for sleep disturbance.   ?All other systems reviewed and are negative. ? ? ?PHYSICAL EXAMINATION: ?ECOG PERFORMANCE STATUS: 1 - Symptomatic but completely ambulatory ? ?Vitals:  ? 11/01/21 1044  ?BP: 132/87  ?Pulse: 80  ?Resp: 18  ?Temp: 98.5 ?F (36.9 ?C)  ?SpO2: 97%  ? ?Filed Weights  ? 11/01/21 1044  ?Weight: 177 lb 8 oz (80.5 kg)  ? ?Physical Exam ?Vitals reviewed.  ?Constitutional:   ?   Appearance: Normal appearance.  ?   Comments: In wheelchair  ?Cardiovascular:  ?   Rate and Rhythm: Normal rate. Rhythm irregular.  ?   Pulses: Normal pulses.  ?   Heart sounds: Normal heart sounds.  ?Pulmonary:  ?   Effort: Pulmonary effort is normal.  ?   Breath sounds: Normal breath sounds.  ?Abdominal:  ?   Palpations: Abdomen is soft. There is no hepatomegaly, splenomegaly or mass.  ?   Tenderness: There is abdominal tenderness in the left upper quadrant.  ?Musculoskeletal:  ?   Right lower leg: No edema.  ?   Left lower leg: No edema.  ?Lymphadenopathy:  ?   Cervical: No cervical adenopathy.  ?   Right cervical: No superficial, deep or posterior cervical adenopathy. ?   Left cervical: No superficial, deep or posterior cervical adenopathy.  ?   Upper Body:  ?    Right upper body: No supraclavicular or axillary adenopathy.  ?   Left upper body: No supraclavicular or axillary adenopathy.  ?Neurological:  ?   General: No focal deficit present.  ?   Mental Status: She is alert and oriented to person, place, and time.  ?Psychiatric:     ?   Mood and Affect: Mood normal.     ?   Behavior: Behavior normal.  ? ? ? ?LABORATORY DATA:  ?I have reviewed the data as listed ?Recent Results (from the past 2160 hour(s))  ?CBC with Differential     Status: Abnormal  ? Collection Time: 10/21/21  2:42 PM  ?Result  Value Ref Range  ? WBC 10.9 (H) 4.0 - 10.5 K/uL  ? RBC 4.02 3.87 - 5.11 MIL/uL  ? Hemoglobin 12.6 12.0 - 15.0 g/dL  ? HCT 38.9 36.0 - 46.0 %  ? MCV 96.8 80.0 - 100.0 fL  ? MCH 31.3 26.0 - 34.0 pg  ? MCHC 32.4 30.0 - 36.0 g/dL  ? RDW 13.2 11.5 - 15.5 %  ? Platelets 417 (H) 150 - 400 K/uL  ? nRBC 0.0 0.0 - 0.2 %  ? Neutrophils Relative % 67 %  ? Neutro Abs 7.3 1.7 - 7.7 K/uL  ? Lymphocytes Relative 13 %  ? Lymphs Abs 1.4 0.7 - 4.0 K/uL  ? Monocytes Relative 11 %  ? Monocytes Absolute 1.2 (H) 0.1 - 1.0 K/uL  ? Eosinophils Relative 2 %  ? Eosinophils Absolute 0.2 0.0 - 0.5 K/uL  ? Basophils Relative 2 %  ? Basophils Absolute 0.2 (H) 0.0 - 0.1 K/uL  ? Immature Granulocytes 5 %  ? Abs Immature Granulocytes 0.55 (H) 0.00 - 0.07 K/uL  ?  Comment: Performed at [facility], [address] [place], [place] [address]  ?Comprehensive metabolic panel     Status: Abnormal  ? Collection Time: 10/21/21  2:42 PM  ?Result Value Ref Range  ? Sodium 136 135 - 145 mmol/L  ? Potassium 3.0 (L) 3.5 - 5.1 mmol/L  ? Chloride 102 98 - 111 mmol/L  ? CO2 25 22 - 32 mmol/L  ? Glucose, Bld 105 (H) 70 - 99 mg/dL  ?  Comment: Glucose reference range applies only to samples taken after fasting for at least 8 hours.  ? BUN 11 8 - 23 mg/dL  ? Creatinine, Ser 1.02 (H) 0.44 - 1.00 mg/dL  ? Calcium 8.0 (L) 8.9 - 10.3 mg/dL  ? Total Protein 6.3 (L) 6.5 - 8.1 g/dL  ? Albumin 3.0 (L) 3.5 - 5.0 g/dL  ? AST 30 15 - 41 U/L  ?  ALT 36 0 - 44 U/L  ? Alkaline Phosphatase 67 38 - 126 U/L  ? Total Bilirubin 0.6 0.3 - 1.2 mg/dL  ? GFR, Estimated 58 (L) >60 mL/min  ?  Comment: (NOTE) ?Calculated using the CKD-EPI Creatinine Equation (2021)

## 2021-11-01 NOTE — Patient Instructions (Addendum)
[facility] at [facility] ?Discharge Instructions ? ?You were seen and examined today by Dr. Ellin Saba. Dr. Ellin Saba is a hematologist, meaning that he specializes in blood abnormalities. Dr. Ellin Saba discussed your past medical history, family history of cancers/blood conditions and the events that led to you being here today. ? ?You were referred to Dr. Ellin Saba due to your history of blood clots. Dr. Ellin Saba has recommended continuing eliquis. You will be monitored with labs in 6 months. ? ?Follow-up as scheduled. ? ? ?Thank you for choosing [facility] at [facility] to provide your oncology and hematology care.  To afford each patient quality time with our provider, please arrive at least 15 minutes before your scheduled appointment time.  ? ?If you have a lab appointment with the Cancer Center please come in thru the Main Entrance and check in at the main information desk. ? ?You need to re-schedule your appointment should you arrive 10 or more minutes late.  We strive to give you quality time with our providers, and arriving late affects you and other patients whose appointments are after yours.  Also, if you no show three or more times for appointments you may be dismissed from the clinic at the providers discretion.     ?Again, thank you for choosing [facility].  Our hope is that these requests will decrease the amount of time that you wait before being seen by our physicians.       ?_____________________________________________________________ ? ?Should you have questions after your visit to [facility], please contact our office at [phone] and follow the prompts.  Our office hours are 8:00 a.m. and 4:30 p.m. Monday - Friday.  Please note that voicemails left after 4:00 p.m. may not be returned until the following business day.  We are closed weekends and major holidays.  You do have access to a nurse 24-7, just call  the main number to the clinic [phone] and do not press any options, hold on the line and a nurse will answer the phone.   ? ?For prescription refill requests, have your pharmacy contact our office and allow 72 hours.   ? ?Due to Covid, you will need to wear a mask upon entering the hospital. If you do not have a mask, a mask will be given to you at the Main Entrance upon arrival. For doctor visits, patients may have 1 support person age 30 or older with them. For treatment visits, patients can not have anyone with them due to social distancing guidelines and our immunocompromised population.  ? ? ? ?

## 2021-11-08 ENCOUNTER — Observation Stay (HOSPITAL_COMMUNITY)
Admission: EM | Admit: 2021-11-08 | Discharge: 2021-11-08 | Disposition: A | Discharge status: Home / Self Care | Payer: Medicare Other | Attending: Internal Medicine | Admitting: Internal Medicine

## 2021-11-08 ENCOUNTER — Emergency Department (HOSPITAL_COMMUNITY): Payer: Medicare Other

## 2021-11-08 ENCOUNTER — Encounter (HOSPITAL_COMMUNITY): Payer: Self-pay | Admitting: Emergency Medicine

## 2021-11-08 ENCOUNTER — Observation Stay (HOSPITAL_COMMUNITY): Payer: Medicare Other

## 2021-11-08 ENCOUNTER — Other Ambulatory Visit: Payer: Self-pay

## 2021-11-08 DIAGNOSIS — I4891 Unspecified atrial fibrillation: Secondary | ICD-10-CM | POA: Diagnosis not present

## 2021-11-08 DIAGNOSIS — R0602 Shortness of breath: Secondary | ICD-10-CM

## 2021-11-08 DIAGNOSIS — R0600 Dyspnea, unspecified: Secondary | ICD-10-CM | POA: Diagnosis not present

## 2021-11-08 DIAGNOSIS — Z79899 Other long term (current) drug therapy: Secondary | ICD-10-CM | POA: Diagnosis not present

## 2021-11-08 DIAGNOSIS — Z7901 Long term (current) use of anticoagulants: Secondary | ICD-10-CM | POA: Insufficient documentation

## 2021-11-08 DIAGNOSIS — K861 Other chronic pancreatitis: Secondary | ICD-10-CM | POA: Diagnosis present

## 2021-11-08 DIAGNOSIS — I10 Essential (primary) hypertension: Secondary | ICD-10-CM | POA: Diagnosis not present

## 2021-11-08 DIAGNOSIS — Z87891 Personal history of nicotine dependence: Secondary | ICD-10-CM | POA: Diagnosis not present

## 2021-11-08 DIAGNOSIS — R197 Diarrhea, unspecified: Secondary | ICD-10-CM | POA: Diagnosis not present

## 2021-11-08 DIAGNOSIS — J90 Pleural effusion, not elsewhere classified: Secondary | ICD-10-CM | POA: Diagnosis present

## 2021-11-08 DIAGNOSIS — J9811 Atelectasis: Secondary | ICD-10-CM | POA: Diagnosis not present

## 2021-11-08 DIAGNOSIS — R079 Chest pain, unspecified: Secondary | ICD-10-CM | POA: Diagnosis not present

## 2021-11-08 LAB — CBC WITH DIFFERENTIAL/PLATELET
Abs Immature Granulocytes: 0.02 K/uL (ref 0.00–0.07)
Basophils Absolute: 0.1 K/uL (ref 0.0–0.1)
Basophils Relative: 1 %
Eosinophils Absolute: 0.2 K/uL (ref 0.0–0.5)
Eosinophils Relative: 2 %
HCT: 40.4 % (ref 36.0–46.0)
Hemoglobin: 13.3 g/dL (ref 12.0–15.0)
Immature Granulocytes: 0 %
Lymphocytes Relative: 13 %
Lymphs Abs: 1.3 K/uL (ref 0.7–4.0)
MCH: 32.2 pg (ref 26.0–34.0)
MCHC: 32.9 g/dL (ref 30.0–36.0)
MCV: 97.8 fL (ref 80.0–100.0)
Monocytes Absolute: 1.1 K/uL — ABNORMAL HIGH (ref 0.1–1.0)
Monocytes Relative: 11 %
Neutro Abs: 7 K/uL (ref 1.7–7.7)
Neutrophils Relative %: 73 %
Platelets: 461 K/uL — ABNORMAL HIGH (ref 150–400)
RBC: 4.13 MIL/uL (ref 3.87–5.11)
RDW: 15.5 % (ref 11.5–15.5)
WBC: 9.7 K/uL (ref 4.0–10.5)
nRBC: 0 % (ref 0.0–0.2)

## 2021-11-08 LAB — URINALYSIS, ROUTINE W REFLEX MICROSCOPIC
Bacteria, UA: NONE SEEN
Bilirubin Urine: NEGATIVE
Glucose, UA: NEGATIVE mg/dL
Ketones, ur: 20 mg/dL — AB
Leukocytes,Ua: NEGATIVE
Nitrite: NEGATIVE
Protein, ur: NEGATIVE mg/dL
Specific Gravity, Urine: 1.01 (ref 1.005–1.030)
pH: 8 (ref 5.0–8.0)

## 2021-11-08 LAB — COMPREHENSIVE METABOLIC PANEL
ALT: 31 U/L (ref 0–44)
AST: 15 U/L (ref 15–41)
Albumin: 3.6 g/dL (ref 3.5–5.0)
Alkaline Phosphatase: 135 U/L — ABNORMAL HIGH (ref 38–126)
Anion gap: 12 (ref 5–15)
BUN: 9 mg/dL (ref 8–23)
CO2: 24 mmol/L (ref 22–32)
Calcium: 9 mg/dL (ref 8.9–10.3)
Chloride: 101 mmol/L (ref 98–111)
Creatinine, Ser: 0.78 mg/dL (ref 0.44–1.00)
GFR, Estimated: >60 mL/min (ref >60)
Glucose, Bld: 114 mg/dL — ABNORMAL HIGH (ref 70–99)
Potassium: 3.9 mmol/L (ref 3.5–5.1)
Sodium: 137 mmol/L (ref 135–145)
Total Bilirubin: 1.6 mg/dL — ABNORMAL HIGH (ref 0.3–1.2)
Total Protein: 7.1 g/dL (ref 6.5–8.1)

## 2021-11-08 LAB — BASIC METABOLIC PANEL
Anion gap: 10 (ref 5–15)
BUN: 9 mg/dL (ref 8–23)
CO2: 21 mmol/L — ABNORMAL LOW (ref 22–32)
Calcium: 8.9 mg/dL (ref 8.9–10.3)
Chloride: 106 mmol/L (ref 98–111)
Creatinine, Ser: 0.76 mg/dL (ref 0.44–1.00)
GFR, Estimated: >60 mL/min (ref >60)
Glucose, Bld: 130 mg/dL — ABNORMAL HIGH (ref 70–99)
Potassium: 4.1 mmol/L (ref 3.5–5.1)
Sodium: 137 mmol/L (ref 135–145)

## 2021-11-08 LAB — CBC
HCT: 41.4 % (ref 36.0–46.0)
Hemoglobin: 13.5 g/dL (ref 12.0–15.0)
MCH: 32.5 pg (ref 26.0–34.0)
MCHC: 32.6 g/dL (ref 30.0–36.0)
MCV: 99.8 fL (ref 80.0–100.0)
Platelets: 450 10*3/uL — ABNORMAL HIGH (ref 150–400)
RBC: 4.15 MIL/uL (ref 3.87–5.11)
RDW: 15.5 % (ref 11.5–15.5)
WBC: 11.6 10*3/uL — ABNORMAL HIGH (ref 4.0–10.5)
nRBC: 0 % (ref 0.0–0.2)

## 2021-11-08 LAB — HEPATIC FUNCTION PANEL
ALT: 34 U/L (ref 0–44)
AST: 20 U/L (ref 15–41)
Albumin: 3.5 g/dL (ref 3.5–5.0)
Alkaline Phosphatase: 135 U/L — ABNORMAL HIGH (ref 38–126)
Bilirubin, Direct: 0.2 mg/dL (ref 0.0–0.2)
Indirect Bilirubin: 1.1 mg/dL — ABNORMAL HIGH (ref 0.3–0.9)
Total Bilirubin: 1.3 mg/dL — ABNORMAL HIGH (ref 0.3–1.2)
Total Protein: 7.1 g/dL (ref 6.5–8.1)

## 2021-11-08 LAB — MAGNESIUM: Magnesium: 1.8 mg/dL (ref 1.7–2.4)

## 2021-11-08 LAB — BRAIN NATRIURETIC PEPTIDE: B Natriuretic Peptide: 798 pg/mL — ABNORMAL HIGH (ref 0.0–100.0)

## 2021-11-08 LAB — TROPONIN I (HIGH SENSITIVITY)
Troponin I (High Sensitivity): 15 ng/L (ref <18)
Troponin I (High Sensitivity): 15 ng/L (ref <18)

## 2021-11-08 LAB — LIPASE, BLOOD: Lipase: 37 U/L (ref 11–51)

## 2021-11-08 MED ORDER — METOPROLOL TARTRATE 5 MG/5ML IV SOLN
5.0000 mg | Freq: Once | INTRAVENOUS | Status: AC
  Administered 2021-11-08: 5 mg via INTRAVENOUS
  Filled 2021-11-08: qty 5

## 2021-11-08 MED ORDER — FUROSEMIDE 10 MG/ML IJ SOLN
40.0000 mg | Freq: Once | INTRAMUSCULAR | Status: AC
  Administered 2021-11-08: 40 mg via INTRAVENOUS
  Filled 2021-11-08: qty 4

## 2021-11-08 MED ORDER — APIXABAN 5 MG PO TABS
5.0000 mg | ORAL_TABLET | Freq: Two times a day (BID) | ORAL | Status: DC
  Administered 2021-11-08: 5 mg via ORAL
  Filled 2021-11-08: qty 1

## 2021-11-08 MED ORDER — ACETAMINOPHEN 325 MG PO TABS: 650.0000 mg | ORAL_TABLET | Freq: Four times a day (QID) | ORAL | Status: DC | PRN

## 2021-11-08 MED ORDER — ALBUTEROL SULFATE (2.5 MG/3ML) 0.083% IN NEBU: 2.5000 mg | INHALATION_SOLUTION | Freq: Four times a day (QID) | RESPIRATORY_TRACT | Status: DC | PRN

## 2021-11-08 MED ORDER — FUROSEMIDE 20 MG PO TABS: 20.0000 mg | ORAL_TABLET | Freq: Every day | ORAL | 0 refills | Status: DC

## 2021-11-08 MED ORDER — METOPROLOL TARTRATE 50 MG PO TABS
50.0000 mg | ORAL_TABLET | Freq: Two times a day (BID) | ORAL | Status: DC
  Administered 2021-11-08: 50 mg via ORAL
  Filled 2021-11-08: qty 1

## 2021-11-08 MED ORDER — ACETAMINOPHEN 650 MG RE SUPP: 650.0000 mg | Freq: Four times a day (QID) | RECTAL | Status: DC | PRN

## 2021-11-08 MED ORDER — OXYCODONE HCL 5 MG PO TABS: 5.0000 mg | ORAL_TABLET | ORAL | Status: DC | PRN

## 2021-11-08 MED ORDER — PANCRELIPASE (LIP-PROT-AMYL) 36000-114000 UNITS PO CPEP
36000.0000 [IU] | ORAL_CAPSULE | Freq: Three times a day (TID) | ORAL | Status: DC
  Administered 2021-11-08 (×2): 36000 [IU] via ORAL
  Filled 2021-11-08 (×3): qty 1

## 2021-11-08 MED ORDER — IOHEXOL 350 MG/ML SOLN
100.0000 mL | Freq: Once | INTRAVENOUS | Status: AC | PRN
Start: 2021-11-08 — End: 2021-11-08
  Administered 2021-11-08: 100 mL via INTRAVENOUS

## 2021-11-08 MED ORDER — DILTIAZEM HCL 25 MG/5ML IV SOLN
15.0000 mg | Freq: Once | INTRAVENOUS | Status: AC
  Administered 2021-11-08: 15 mg via INTRAVENOUS
  Filled 2021-11-08: qty 5

## 2021-11-08 MED ORDER — HYDROCOD POLI-CHLORPHE POLI ER 10-8 MG/5ML PO SUER: 5.0000 mL | Freq: Two times a day (BID) | ORAL | Status: DC | PRN

## 2021-11-08 MED ORDER — ONDANSETRON HCL 4 MG PO TABS: 4.0000 mg | ORAL_TABLET | Freq: Four times a day (QID) | ORAL | Status: DC | PRN

## 2021-11-08 MED ORDER — ONDANSETRON HCL 4 MG/2ML IJ SOLN: 4.0000 mg | Freq: Four times a day (QID) | INTRAMUSCULAR | Status: DC | PRN

## 2021-11-08 MED ORDER — LORATADINE 10 MG PO TABS: 10.0000 mg | ORAL_TABLET | Freq: Every day | ORAL | Status: DC

## 2021-11-08 MED ORDER — LEVOCETIRIZINE DIHYDROCHLORIDE 5 MG PO TABS: 5.0000 mg | ORAL_TABLET | Freq: Every day | ORAL | Status: DC

## 2021-11-08 NOTE — Assessment & Plan Note (Addendum)
-   Likely multifactorial with pleural effusions and RVR both contributing ?-Patient reports shortness of breath is a standard symptom of her RVR ?-Treat underlying A-fib and pleural effusions ?-No O2 requirement at this time ?

## 2021-11-08 NOTE — Assessment & Plan Note (Addendum)
-   Continue metoprolol 50 mg p.o. twice daily ?

## 2021-11-08 NOTE — Assessment & Plan Note (Addendum)
-   CTA shows bilateral moderate pleural effusions with compressive atelectasis in the lower lobes ?-Voided well with IV Lasix during hospitalization.  Discharged on short course of oral Lasix and outpatient follow-up with cardiology to determine if longer course needed ?-Recent echo obtained on 10/22/2021 showed EF 60 to 65%, no RWMA.  Mild LVH.  Unable to determine diastolic function. ?

## 2021-11-08 NOTE — Discharge Summary (Signed)
?Physician Discharge Summary ?  ?Patient: Shelly Maldonado MRN: [id number] DOB: October 06, 1945  ?Admit date:     11/08/2021  ?Discharge date: 11/08/2021  ?Discharge Physician: Lewie Chamber  ? ?PCP: Toma Deiters, MD  ? ?Recommendations at discharge:  ? ? Follow up with cardiology  ? ?Discharge Diagnoses: ?Principal Problem: ?  Atrial fibrillation with RVR (HCC) ?Active Problems: ?  Pleural effusion ?  HTN (hypertension) ?  Chronic pancreatitis (HCC) ? ?Resolved Problems: ?  Dyspnea ? ?Hospital Course: ? ?* Atrial fibrillation with RVR (HCC) ?- Heart rate in the 120s on admission; etiology possibly from underlying pleural effusions as well ?- s/p cardizem and lopressor doses in ER and resumed back on home lopressor with good response ?-Outpatient follow-up with cardiology appointment which she has upcoming on May 8 ? ?Dyspnea-resolved as of 11/08/2021 ?- Likely multifactorial with pleural effusions and RVR both contributing ?-Patient reports shortness of breath is a standard symptom of her RVR ?-Treat underlying A-fib and pleural effusions ?-No O2 requirement at this time ? ?Pleural effusion ?- CTA shows bilateral moderate pleural effusions with compressive atelectasis in the lower lobes ?-Voided well with IV Lasix during hospitalization.  Discharged on short course of oral Lasix and outpatient follow-up with cardiology to determine if longer course needed ?-Recent echo obtained on 10/22/2021 showed EF 60 to 65%, no RWMA.  Mild LVH.  Unable to determine diastolic function. ? ?Chronic pancreatitis (HCC) ?- Continue Creon ?-Lipase 37 ? ?HTN (hypertension) ?- Continue metoprolol 50 mg p.o. twice daily ? ? ? ? ?  ? ? ?Consultants:  ?Procedures performed:   ?Disposition: Home ?Diet recommendation:  ?Discharge Diet Orders (From admission, onward)  ? ?  Start     Ordered  ? 11/08/21 0000  Diet - low sodium heart healthy       ? 11/08/21 1048  ? ?  ?  ? ?  ? ?Cardiac diet ?DISCHARGE MEDICATION: ?Allergies as of 11/08/2021   ? ?    Reactions  ? Codeine Other (See Comments)  ? unknown  ? Morphine And Related   ? Vein became red  ? ?  ? ?  ?Medication List  ?  ? ?STOP taking these medications   ? ?chlorpheniramine-HYDROcodone 10-8 MG/5ML ?  ?nitrofurantoin (macrocrystal-monohydrate) 100 MG capsule ?Commonly known as: MACROBID ?  ? ?  ? ?TAKE these medications   ? ?albuterol 108 (90 Base) MCG/ACT inhaler ?Commonly known as: VENTOLIN HFA ?Inhale 2 puffs into the lungs every 6 (six) hours as needed for wheezing or shortness of breath. ?  ?Creon 36000 UNITS Cpep capsule ?Generic drug: lipase/protease/amylase ?Take 1 capsule by mouth 3 (three) times daily before meals. ?  ?Eliquis 5 MG Tabs tablet ?Generic drug: apixaban ?Take 5 mg by mouth 2 (two) times daily. ?  ?furosemide 20 MG tablet ?Commonly known as: Lasix ?Take 1 tablet (20 mg total) by mouth daily for 5 days. ?  ?levocetirizine 5 MG tablet ?Commonly known as: XYZAL ?Take 5 mg by mouth at bedtime. ?  ?loperamide 2 MG capsule ?Commonly known as: IMODIUM ?Take 1 capsule (2 mg total) by mouth every 6 (six) hours as needed for diarrhea or loose stools. ?  ?metoprolol tartrate 50 MG tablet ?Commonly known as: LOPRESSOR ?Take 1 tablet (50 mg total) by mouth 2 (two) times daily. ?  ? ?  ? ? ?Discharge Exam: ?Filed Weights  ? 11/08/21 0116 11/08/21 0752  ?Weight: 81 kg 77.2 kg  ? ?Physical Exam ?Constitutional:   ?  General: She is not in acute distress. ?   Appearance: She is well-developed.  ?HENT:  ?   Head: Normocephalic and atraumatic.  ?   Mouth/Throat:  ?   Mouth: Mucous membranes are moist.  ?Eyes:  ?   Extraocular Movements: Extraocular movements intact.  ?Cardiovascular:  ?   Rate and Rhythm: Normal rate. Rhythm irregular.  ?Pulmonary:  ?   Comments: Decreased bibasilar breath sounds, mild crackles.  No wheezing. ?Abdominal:  ?   General: Bowel sounds are normal. There is no distension.  ?   Palpations: Abdomen is soft.  ?   Tenderness: There is no abdominal tenderness.  ?Musculoskeletal:      ?   General: No swelling. Normal range of motion.  ?   Cervical back: Normal range of motion and neck supple.  ?Skin: ?   General: Skin is warm and dry.  ?Neurological:  ?   General: No focal deficit present.  ?   Mental Status: She is alert.  ?Psychiatric:     ?   Mood and Affect: Mood normal.     ?   Behavior: Behavior normal.  ? ? ? ?Condition at discharge: stable ? ?The results of significant diagnostics from this hospitalization (including imaging, microbiology, ancillary and laboratory) are listed below for reference.  ? ?Imaging Studies: ?CT Angio Chest PE W/Cm &/Or Wo Cm ? ?Result Date: 11/08/2021 ?CLINICAL DATA:  Shortness of breath, neck, back, chest pain EXAM: CT ANGIOGRAPHY CHEST WITH CONTRAST TECHNIQUE: Multidetector CT imaging of the chest was performed using the standard protocol during bolus administration of intravenous contrast. Multiplanar CT image reconstructions and MIPs were obtained to evaluate the vascular anatomy. RADIATION DOSE REDUCTION: This exam was performed according to the departmental dose-optimization program which includes automated exposure control, adjustment of the mA and/or kV according to patient size and/or use of iterative reconstruction technique. CONTRAST:  OMNIPAQUE IOHEXOL 350 MG/ML SOLN COMPARISON:  None. FINDINGS: Cardiovascular: No filling defects in the pulmonary arteries to suggest pulmonary emboli. Heart is normal size. Aorta is normal caliber. Scattered aortic calcifications. Mediastinum/Nodes: No mediastinal, hilar, or axillary adenopathy. Trachea and esophagus are unremarkable. Thyroid unremarkable. Lungs/Pleura: Moderate bilateral pleural effusions. Compressive atelectasis in the lower lobes. Upper Abdomen: Imaging into the upper abdomen demonstrates no acute findings. Musculoskeletal: Chest wall soft tissues are unremarkable. No acute bony abnormality. Review of the MIP images confirms the above findings. IMPRESSION: No evidence of pulmonary embolus.  Moderate bilateral pleural effusions with compressive atelectasis in the lower lobes. Aortic Atherosclerosis (ICD10-I70.0). Electronically Signed   By: Charlett Nose M.D.   On: 11/08/2021 03:01  ? ?CT ABDOMEN PELVIS W CONTRAST ? ?Result Date: 10/21/2021 ?CLINICAL DATA:  Left upper quadrant abdominal pain. Nausea, vomiting, and diarrhea for 3 days. EXAM: CT ABDOMEN AND PELVIS WITH CONTRAST TECHNIQUE: Multidetector CT imaging of the abdomen and pelvis was performed using the standard protocol following bolus administration of intravenous contrast. RADIATION DOSE REDUCTION: This exam was performed according to the departmental dose-optimization program which includes automated exposure control, adjustment of the mA and/or kV according to patient size and/or use of iterative reconstruction technique. CONTRAST:  OMNIPAQUE IOHEXOL 300 MG/ML  SOLN COMPARISON:  02/04/2018 FINDINGS: Lower chest: Small left pleural effusion. Trace right pleural fluid. Borderline cardiomegaly. Descending thoracic aortic atherosclerosis. Small type 1 hiatal hernia. Hepatobiliary: Cholecystectomy. 5 by 3 mm hypodense lesion in the right hepatic lobe on image 11 of series 2 is faintly appreciable on 02/04/2018 and highly likely to be benign/incidental. Pancreas:  Unremarkable Spleen: 1.3 by 1.0 by 1.3 cm hypodensity posteriorly in the spleen is new compared to 02/04/2018, and technically nonspecific, but in conjunction with other findings in the abdomen this is probably a small splenic infarct. Adrenals/Urinary Tract: Small hypodense renal lesions in the 5 mm diameter range are present for example on image 20 on the right and image 20 on the left, highly likely to be small benign cysts or similar benign lesions although technically nonspecific due to small size. A 1.0 by 0.7 cm exophytic fluid density or near fluid density lesion from the left kidney upper pole on image 84 series 6 is similar to the 02/04/2018 exam and most compatible with a  benign cyst. In general, such lesions are not felt to warrant further workup unless there is a specific renal clinical concern. The adrenal glands appear normal. The urinary bladder appears normal. [place]

## 2021-11-08 NOTE — Assessment & Plan Note (Addendum)
-   Heart rate in the 120s on admission; etiology possibly from underlying pleural effusions as well ?- s/p cardizem and lopressor doses in ER and resumed back on home lopressor with good response ?-Outpatient follow-up with cardiology appointment which she has upcoming on May 8 ?

## 2021-11-08 NOTE — ED Provider Notes (Signed)
? ?[facility]  ?Provider Note ? ?CSN: [id number] ?Arrival date & time: 11/08/21 0107 ? ?History ?Chief Complaint  ?Patient presents with  ? Shortness of Breath  ? ? ?Shelly Maldonado is a 76 y.o. female with history of chronic pancreatitis has had two recent admissions and new onset afib, on Eliquis for about a month. 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 She was on heparin/lovenox for a few days before being restarted on Eliquis at discharge. She was doing well until yesterday when she began to have some SOB, worse with exertion, occasional anterior chest pains and dry heaves. No vomiting. Chronic diarrhea is improved. She denies any fever. She has been compliant with her eliquis. Has not yet seen cardiology, that appointment is scheduled in about 2 weeks. Her SOB has improved on the drive down here from her home in [place]. Sister is at bedside to supplement history.  ? ? ?Home Medications ?Prior to Admission medications   ?Medication Sig Start Date End Date Taking? Authorizing Provider  ?albuterol (VENTOLIN HFA) 108 (90 Base) MCG/ACT inhaler Inhale 2 puffs into the lungs every 6 (six) hours as needed for wheezing or shortness of breath. 10/26/21   Kathie Dike, MD  ?chlorpheniramine-HYDROcodone 10-8 MG/5ML Take 5 mLs by mouth every 12 (twelve) hours as needed for cough. 10/26/21   Kathie Dike, MD  ?CREON [id number] units CPEP capsule Take 1 capsule by mouth 3 (three) times daily before meals. 10/01/21   [provider]  ?ELIQUIS 5 MG TABS tablet Take 5 mg by mouth 2 (two) times daily. 10/16/21   [provider]  ?levocetirizine (XYZAL) 5 MG tablet Take 5 mg by mouth at bedtime. 07/09/21   [provider]  ?loperamide (IMODIUM) 2 MG capsule Take 1 capsule (2 mg total) by mouth every 6 (six) hours as needed for diarrhea or loose stools. 10/26/21   Kathie Dike, MD   ?metoprolol tartrate (LOPRESSOR) 50 MG tablet Take 1 tablet (50 mg total) by mouth 2 (two) times daily. 10/26/21   Kathie Dike, MD  ?nitrofurantoin, macrocrystal-monohydrate, (MACROBID) 100 MG capsule Take 100 mg by mouth 2 (two) times daily. 10/26/21   [provider]  ? ? ? ?Allergies    ?Codeine and Morphine and related ? ? ?Review of Systems   ?Review of Systems ?Please see HPI for pertinent positives and negatives ? ?Physical Exam ?BP (!) 136/101   Pulse 93   Temp 98.3 ?F (36.8 ?C) (Oral)   Resp (!) 22   Ht 5\' 4"  (1.626 m)   Wt 81 kg   SpO2 94%   BMI 30.65 kg/m?  ? ?Physical Exam ?Vitals and nursing note reviewed.  ?Constitutional:   ?   Appearance: Normal appearance.  ?HENT:  ?   Head: Normocephalic and atraumatic.  ?   Nose: Nose normal.  ?   Mouth/Throat:  ?   Mouth: Mucous membranes are moist.  ?Eyes:  ?   Extraocular Movements: Extraocular movements intact.  ?   Conjunctiva/sclera: Conjunctivae normal.  ?Cardiovascular:  ?   Rate and Rhythm: Tachycardia present. Rhythm irregular.  ?Pulmonary:  ?   Effort: Pulmonary effort is normal.  ?   Breath sounds: Normal breath sounds.  ?Abdominal:  ?   General: Abdomen is flat.  ?   Palpations: Abdomen is soft.  ?   Tenderness: There is no abdominal tenderness.  ?Musculoskeletal:     ?  General: No swelling. Normal range of motion.  ?   Cervical back: Neck supple.  ?Skin: ?   General: Skin is warm and dry.  ?Neurological:  ?   General: No focal deficit present.  ?   Mental Status: She is alert.  ?Psychiatric:     ?   Mood and Affect: Mood normal.  ? ? ?ED Results / Procedures / Treatments   ?EKG ?EKG Interpretation ? ?Date/Time:  Friday November 08 2021 01:21:46 EDT ?Ventricular Rate:  122 ?PR Interval:    ?QRS Duration: 128 ?QT Interval:  322 ?QTC Calculation: 478 ?R Axis:   49 ?Text Interpretation: Atrial fibrillation Nonspecific intraventricular conduction delay Borderline T abnormalities, diffuse leads No significant change since last tracing  EARLIER SAME DATE Confirmed by Calvert Cantor [id number]) on 11/08/2021 1:24:52 AM ? ?Procedures ?Procedures ? ?Medications Ordered in the ED ?Medications  ?furosemide (LASIX) injection 40 mg (has no administration in time range)  ?metoprolol tartrate (LOPRESSOR) injection 5 mg (5 mg Intravenous Given 11/08/21 0141)  ?iohexol (OMNIPAQUE) 350 MG/ML injection 100 mL (100 mLs Intravenous Contrast Given 11/08/21 0245)  ? ? ?Initial Impression and Plan ? Patient with known afib now with tachycardia. She is having SOB with exertion, improved prior to arrival. She had embolic disease while on Eliquis earlier this month but not felt to be a failure at the time due to only having been on the Eliquis a few days at that time. Will check labs, including CBC, CMP, Lipase, trop and BNP. Anticipate CTA to rule out PE as well.  ? ?ED Course  ? ?Clinical Course as of 11/08/21 0338  ?Fri Nov 08, 2021  ?0200 CBC with mild leukocytosis. I personally viewed the images from radiology studies and agree with radiologist interpretation: CXR is clear. Prior consolidation is resolved.  ? [CS]  ?0220 BMP, LFTs Lipase and trop are unremarkable.  [CS]  ?0221 BNP is mildly elevated. Will send for CTA to evaluation for PE [CS]  ?[id number] I personally viewed the images from radiology studies and agree with radiologist interpretation: No PE, moderate bilateral effusions may be cause of her SOB. She remains mildly tachycardic in afib after Lopressor. Will discuss admission with Hospitalist.  ? [CS]  ?[place] with Dr. Clearence Ped, Hospitalist, who will evaluate for admission.  [CS]  ?  ?Clinical Course User Index ?[CS] Truddie Hidden, MD  ? ? ? ?MDM Rules/Calculators/A&P ?Medical Decision Making ?Problems Addressed: ?Atrial fibrillation, unspecified type [facility]): chronic illness or injury with exacerbation, progression, or side effects of treatment ?Pleural effusion: acute illness or injury ?SOB (shortness of breath): acute illness or injury ? ?Amount  and/or Complexity of Data Reviewed ?Labs: ordered. Decision-making details documented in ED Course. ?Radiology: ordered and independent interpretation performed. Decision-making details documented in ED Course. ? ?Risk ?Prescription drug management. ?Decision regarding hospitalization. ? ? ? ?Final Clinical Impression(s) / ED Diagnoses ?Final diagnoses:  ?SOB (shortness of breath)  ?Pleural effusion  ?Atrial fibrillation, unspecified type ([place])  ? ? ?Rx / DC Orders ?ED Discharge Orders   ? ? None  ? ?  ? ?  ?Truddie Hidden, MD ?11/08/21 [phone] ? ?

## 2021-11-08 NOTE — TOC Progression Note (Signed)
?  Transition of Care (TOC) Screening Note ? ? ?Patient Details  ?Name: Shelly Maldonado ?Date of Birth: October 29, 1945 ? ? ?Transition of Care (TOC) CM/SW Contact:    ?Elliot Gault, LCSW ?Phone Number: ?11/08/2021, 9:08 AM ? ? ? ?Transition of Care Department [facility]) has reviewed patient and no TOC needs have been identified at this time. We will continue to monitor patient advancement through interdisciplinary progression rounds. If new patient transition needs arise, please place a TOC consult. ? ? ?

## 2021-11-08 NOTE — ED Triage Notes (Signed)
Pt with c/o SOB since yesterday. Also c/o neck,back,and chest pain. ?

## 2021-11-08 NOTE — H&P (Signed)
?History and Physical  ? ? ?Patient: Shelly Maldonado CXK:[id number] DOB: 01/24/1946 ?DOA: 11/08/2021 ?DOS: the patient was seen and examined on 11/08/2021 ?PCP: Toma Deiters, MD  ?Patient coming from: Home ? ?Chief Complaint:  ?Chief Complaint  ?Patient presents with  ? Shortness of Breath  ? ?HPI: Shelly Maldonado is a 76 y.o. female with medical history significant of with history of atrial fibrillation, hypertension, chronic pancreatitis, and more presents the ED with a chief complaint of dyspnea patient reports it has been going on for several weeks.  She was indeed admitted for pneumonia couple weeks ago.  Her dyspnea has been worse over the last 2 days though.  She has an associated nonproductive cough with posttussis nausea and dry heaves.  Patient reports only 1 episode of nausea and vomiting that occurred on the day of presentation.  It was nonbloody emesis.  Her last normal bowel movement was on the day of presentation as well.  She has a history of chronic diarrhea that Creon has not improved.  Patient takes Eliquis for atrial fibrillation related anticoagulation.  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  These were determined to not be an Eliquis failure because of the short duration that she was on Eliquis prior to these findings.  CTA today does not show a PE.  Patient describes her dyspnea is exertional, with orthopnea.  She reports orthopnea has been present for several months.  More than making her short of breath when she lays back she has worsening palpitations which then cause her short of breath per her report.  She has a chest pain at the nipple line at the midclavicular line.  It feels like pressure.  It radiates to the right neck where it feels like a dull ache.  Patient reports subjective fevers as well.  She has dependent edema, but has not had any change in her peripheral edema.  Patient reports decreased appetite secondary to nausea and  vomiting.  She has no other complaints at this time. ? ?Patient does not smoke, does not drink, does not use illicit drugs.  Patient is vaccinated for COVID.  Patient is DNI. ?Review of Systems: As mentioned in the history of present illness. All other systems reviewed and are negative. ?Past Medical History:  ?Diagnosis Date  ? Atrial fibrillation (HCC)   ? Hypertension   ? Pancreatitis   ? Shingles   ? ?Past Surgical History:  ?Procedure Laterality Date  ? CATARACT EXTRACTION Bilateral 2016  ? CHOLECYSTECTOMY    ? COLONOSCOPY N/A 03/04/2018  ? Procedure: COLONOSCOPY;  Surgeon: Malissa Hippo, MD;  Location: [facility];  Service: Endoscopy;  Laterality: N/A;  12:45  ? complete hysterecomy    ? ?Social History:  reports that she has quit smoking. She has never used smokeless tobacco. She reports that she does not drink alcohol and does not use drugs. ? ?Allergies  ?Allergen Reactions  ? Codeine Other (See Comments)  ?  unknown  ? Morphine And Related   ?  Vein became red  ? ? ?Family History  ?Problem Relation Age of Onset  ? Colon cancer Neg Hx   ? ? ?Prior to Admission medications   ?Medication Sig Start Date End Date Taking? Authorizing Provider  ?albuterol (VENTOLIN HFA) 108 (90 Base) MCG/ACT inhaler Inhale 2 puffs into the lungs every 6 (six) hours as needed for wheezing or shortness of breath. 10/26/21   Erick Blinks,  MD  ?chlorpheniramine-HYDROcodone 10-8 MG/5ML Take 5 mLs by mouth every 12 (twelve) hours as needed for cough. 10/26/21   Erick Blinks, MD  ?CREON [id number] units CPEP capsule Take 1 capsule by mouth 3 (three) times daily before meals. 10/01/21   [provider]  ?ELIQUIS 5 MG TABS tablet Take 5 mg by mouth 2 (two) times daily. 10/16/21   [provider]  ?levocetirizine (XYZAL) 5 MG tablet Take 5 mg by mouth at bedtime. 07/09/21   [provider]  ?loperamide (IMODIUM) 2 MG capsule Take 1 capsule (2 mg total) by mouth every 6 (six) hours as needed for diarrhea  or loose stools. 10/26/21   Erick Blinks, MD  ?metoprolol tartrate (LOPRESSOR) 50 MG tablet Take 1 tablet (50 mg total) by mouth 2 (two) times daily. 10/26/21   Erick Blinks, MD  ?nitrofurantoin, macrocrystal-monohydrate, (MACROBID) 100 MG capsule Take 100 mg by mouth 2 (two) times daily. 10/26/21   [provider]  ? ? ?Physical Exam: ?Vitals:  ? 11/08/21 0200 11/08/21 0230 11/08/21 0300 11/08/21 0407  ?BP: (!) 159/93 (!) 159/98 (!) 136/101 118/72  ?Pulse: (!) 113 (!) 125 93 77  ?Resp: (!) 29 16 (!) 22 (!) 26  ?Temp:      ?TempSrc:      ?SpO2: 92% 95% 94% 92%  ?Weight:      ?Height:      ? ?1.  General: ?Patient lying supine in bed,  no acute distress ?  ?2. Psychiatric: ?Alert and oriented x 3, mood and behavior normal for situation, pleasant and cooperative with exam ?  ?3. Neurologic: ?Speech and language are normal, face is symmetric, moves all 4 extremities voluntarily, at baseline without acute deficits on limited exam ?  ?4. HEENMT:  ?Head is atraumatic, normocephalic, pupils reactive to light, neck is supple, trachea is midline, mucous membranes are moist ?  ?5. Respiratory : ?Lungs are clear to auscultation bilaterally without wheezing, rhonchi, rales, no cyanosis, no increase in work of breathing or accessory muscle use ?  ?6. Cardiovascular : ?Heart rate tachycardic, rhythm irregularly irregular, no murmurs, rubs or gallops, no peripheral edema, peripheral pulses palpated ?  ?7. Gastrointestinal:  ?Abdomen is soft, nondistended, nontender to palpation bowel sounds active, no masses or organomegaly palpated ?  ?8. Skin:  ?Skin is warm, dry and intact without rashes, acute lesions, or ulcers on limited exam ?  ?9.Musculoskeletal:  ?No acute deformities or trauma, no asymmetry in tone, no peripheral edema, peripheral pulses palpated, no tenderness to palpation in the extremities ? ?Data Reviewed: ?In the ED ?Temp 98.3, heart rate 93-128, respiratory rate 16-29, blood pressure 136/101, satting on  room air at 92 to 95% ?No leukocytosis with a white blood cell count of 11.6, hemoglobin 13.5, platelets 450 ?Chemistry is unremarkable aside from an elevated T. bili at 1.3, BNP 798 ?Trope 15 ?CTA shows no PE.  Moderate bilateral pleural effusions with compressive atelectasis in the lower lobes ?Chest x-ray shows no acute cardiopulmonary disease ?EKG shows A-fib, QTc 478, heart rate 122 ?Admission requested for dyspnea with increased work of breathing ? ?Assessment and Plan: ?* Pleural effusion ?- CTA shows bilateral moderate pleural effusions with compressive atelectasis in the lower lobes ?-40 mg IV Lasix ?-Repeat chest x-ray in the a.m. to monitor progress ?-Consider thoracentesis if pleural effusions are not improving ?-Patient's last echo was April 2023 and shows ejection fraction 60-65% with indeterminate diastolic dysfunction ?-Possible that these pleural effusions are related to diastolic CHF versus relation to  chronic pancreatitis among other differentials ?-Continue to monitor ? ?Atrial fibrillation with RVR (HCC) ?- Heart rate in the 120s ?-Associated dyspnea ?-Continue home metoprolol 50 mg p.o. twice daily ?-1 dose IV metoprolol 5 mg given in ED ?-1 dose of IV Cardizem 15 mg given at admission ?-Most likely secondary to pleural effusions/pulmonary pathology ?-Monitor on telemetry ? ?Dyspnea ?- Likely multifactorial with pleural effusions and RVR both contributing ?-Patient reports shortness of breath is a standard symptom of her RVR ?-Treat underlying A-fib and pleural effusions ?-No O2 requirement at this time ?-Increased work of breathing, without acute distress ?-Continue to monitor closely ? ?Chronic pancreatitis (HCC) ?- Continue Creon ?-Lipase 37 ?-Continue to monitor ? ?HTN (hypertension) ?- Continue metoprolol 50 mg p.o. twice daily ?-5 mg metoprolol IV given in the ED ?-IV Cardizem 15 mg ordered at admission ?-Last BP prior to Cardizem was 154/101 ?-Continue to monitor ? ? ? ? ? Advance Care  Planning:   Code Status: Prior DNI ? ?Consults: None ? ?Family Communication: Sister at bedside ? ?Severity of Illness: ?The appropriate patient status for this patient is OBSERVATION. Observation sta

## 2021-11-08 NOTE — Assessment & Plan Note (Addendum)
-   Continue Creon ?-Lipase 37 ?

## 2021-11-11 NOTE — Progress Notes (Signed)
? ?Cardiology Office Note   ? ?Date:  11/18/2021  ? ?ID:  Shelly Maldonado, DOB 07-Sep-1945, MRN [id number] ? ? ?PCP:  Toma Deiters, MD ?  ?[facility]  ?Cardiologist:  Meriam Sprague, MD   ?Advanced Practice Provider:  No care team member to display ?Electrophysiologist:  None  ? ?[id number]}  ? ?Chief Complaint  ?Patient presents with  ? Hospitalization Follow-up  ? ? ?History of Present Illness:  ?Shelly Maldonado is a 76 y.o. female with history of paroxysmal atrial fibrillation, hypertension, chronic pancreatitis.  Patient was first diagnosed with atrial fibrillation in [facility] 10/11/2021 when admitted for UTI.  She was started on metoprolol and apixaban. ? ?She was seen in the hospital 10/24/2021 for A-fib with RVR in the setting of ischemic enteritis with acute splenic infarct felt due to embolic phenomenon in the setting of A-fib.  Given that she was only on Eliquis for 1 week this was not considered AC failure.  2D echo normal BiV function moderate pulmonary hypertension trivial pericardial effusion. Readmitted 11/08/21 with afib with RVR and bilateral pleural effusions, dyspnea, given short dose of lasix and HR controlled with dilt and lopressor. We didn't see her that admission. ? ?Patient comes in with her sister. Doesn't feel her heart race. Gest short of breath and she'll slow down. Not much swelling, just occasional. Her sister is concerned that she has had DOE for over 2 years and wondering if she had Afib back then. LA size is normal on echo. Father had early CAD 78's, died 81 MI.  ? ? ? ?Past Medical History:  ?Diagnosis Date  ? Atrial fibrillation (HCC)   ? Hypertension   ? Pancreatitis   ? Shingles   ? ? ?Past Surgical History:  ?Procedure Laterality Date  ? CATARACT EXTRACTION Bilateral 2016  ? CHOLECYSTECTOMY    ? COLONOSCOPY N/A 03/04/2018  ? Procedure: COLONOSCOPY;  Surgeon: Malissa Hippo, MD;  [facility];  Service: Endoscopy;  Laterality: N/A;   12:45  ? complete hysterecomy    ? ? ?Current Medications: ?Current Meds  ?Medication Sig  ? albuterol (VENTOLIN HFA) 108 (90 Base) MCG/ACT inhaler Inhale 2 puffs into the lungs every 6 (six) hours as needed for wheezing or shortness of breath.  ? CREON 36000-114000 units CPEP capsule Take 1 capsule by mouth 3 (three) times daily before meals.  ? ELIQUIS 5 MG TABS tablet Take 5 mg by mouth 2 (two) times daily.  ? levocetirizine (XYZAL) 5 MG tablet Take 5 mg by mouth at bedtime.  ? loperamide (IMODIUM) 2 MG capsule Take 1 capsule (2 mg total) by mouth every 6 (six) hours as needed for diarrhea or loose stools.  ? metoprolol tartrate (LOPRESSOR) 50 MG tablet Take 1.5 tablets (75 mg total) by mouth 2 (two) times daily.  ? [DISCONTINUED] metoprolol tartrate (LOPRESSOR) 50 MG tablet Take 1 tablet (50 mg total) by mouth 2 (two) times daily.  ?  ? ?Allergies:   Codeine and Morphine and related  ? ?Social History  ? ?Socioeconomic History  ? Marital status: Divorced  ?  Spouse name: Not on file  ? Number of children: Not on file  ? Years of education: Not on file  ? Highest education level: Not on file  ?Occupational History  ? Not on file  ?Tobacco Use  ? Smoking status: Former  ? Smokeless tobacco: Never  ? Tobacco comments:  ?  smoked 48 yrs ago.   ?Vaping Use  ?  Vaping Use: Never used  ?Substance and Sexual Activity  ? Alcohol use: Never  ? Drug use: Never  ? Sexual activity: Not on file  ?Other Topics Concern  ? Not on file  ?Social History Narrative  ? Not on file  ? ?Social Determinants of Health  ? ?Financial Resource Strain: Not on file  ?Food Insecurity: Not on file  ?Transportation Needs: Not on file  ?Physical Activity: Not on file  ?Stress: Not on file  ?Social Connections: Not on file  ?  ? ?Family History:  The patient's  family history includes Cancer in her mother; Heart attack in her father.  ? ?ROS:   ?Please see the history of present illness.    ?ROS All other systems reviewed and are  negative. ? ? ?PHYSICAL EXAM:   ?VS:  BP 136/84   Pulse (!) 103   Ht 5' 4.5" (1.638 m)   Wt 169 lb (76.7 kg)   SpO2 96%   BMI 28.56 kg/m?   ?Physical Exam  ?GEN: Well nourished, well developed, in no acute distress  ?Neck: no JVD, carotid bruits, or masses ?Cardiac:irreg irreg,  no murmurs, rubs, or gallops  ?Respiratory:  clear to auscultation bilaterally, normal work of breathing ?GI: soft, nontender, nondistended, + BS ?Ext: without cyanosis, clubbing, or edema, Good distal pulses bilaterally ?MS: no deformity or atrophy  ?Skin: warm and dry, no rash ?Neuro:  Alert and Oriented x 3, Strength and sensation are intact ?Psych: euthymic mood, full affect ? ?Wt Readings from Last 3 Encounters:  ?11/18/21 169 lb (76.7 kg)  ?11/08/21 170 lb 3.2 oz (77.2 kg)  ?11/01/21 177 lb 8 oz (80.5 kg)  ?  ? ? ?Studies/Labs Reviewed:  ? ?EKG:  EKG is  ordered today.  The ekg ordered today demonstrates Afib 103/m ? ?Recent Labs: ?10/22/2021: TSH 6.342 ?11/08/2021: ALT 31; B Natriuretic Peptide 798.0; BUN 9; Creatinine, Ser 0.78; Hemoglobin 13.3; Magnesium 1.8; Platelets 461; Potassium 3.9; Sodium 137  ? ?Lipid Panel ?   ?Component Value Date/Time  ? CHOL 101 10/22/2021 0351  ? TRIG 83 10/22/2021 0351  ? HDL 30 (L) 10/22/2021 0351  ? CHOLHDL 3.4 10/22/2021 0351  ? VLDL 17 10/22/2021 0351  ? LDLCALC 54 10/22/2021 0351  ? LDLCALC 94 01/27/2018 1515  ? ? ?Additional studies/ records that were reviewed today include:  ?TTE 10/22/21: ?IMPRESSIONS  ? 1. Left ventricular ejection fraction, by estimation, is 60 to 65%. The  ?left ventricle has normal function. The left ventricle has no regional  ?wall motion abnormalities. There is mild left ventricular hypertrophy of  ?the basal-septal segment. Left  ?ventricular diastolic function could not be evaluated.  ? 2. Right ventricular systolic function is normal. The right ventricular  ?size is normal. There is moderately elevated pulmonary artery systolic  ?pressure. The estimated right  ventricular systolic pressure is 50.1 mmHg.  ? 3. The pericardial effusion is anterior to the right ventricle.  ? 4. The mitral valve is normal in structure. Trivial mitral valve  ?regurgitation. No evidence of mitral stenosis.  ? 5. The aortic valve is normal in structure. Aortic valve regurgitation is  ?not visualized. No aortic stenosis is present.  ? 6. The inferior vena cava is normal in size with greater than 50%  ?respiratory variability, suggesting right atrial pressure of 3 mmHg.  ?  ? ? ?Risk Assessment/Calculations:   ? ?CHA2DS2-VASc Score = 3  ? This indicates a 3.2% annual risk of stroke. ?The patient's score is based upon: ?  CHF History: 0 ?HTN History: 0 ?Diabetes History: 0 ?Stroke History: 0 ?Vascular Disease History: 1 ?Age Score: 1 ?Gender Score: 1 ?  ? ? ? ? ? ?ASSESSMENT:   ? ?1. Atrial fibrillation with RVR (HCC)   ?2. Ischemic enteritis (HCC)   ?3. Essential hypertension   ? ? ? ?PLAN:  ?In order of problems listed above: ? ?Paroxysmal atrial fibrillation on Eliquis complicated by ischemic enteritis with splenic infarct concerning from embolic phenomena. Initial plan was for rate control and consider TEE/DCCV in future given high concern for LAA thrombus. Patient's HR still fast and another hospitalization with RVR and pleural effusions. Will increase metoprolol 75 mg bid and bring back to reassess.  ? ?History of ischemic enteritis with acute splenic infarct felt due to embolic phenomenon in the setting of A-fib.  Given that she was only on Eliquis for 1 week prior to this was not considered AC failure ? ?Hypertension BP controlled ? ?Shared Decision Making/Informed Consent   ?   ? ? ?Medication Adjustments/Labs and Tests Ordered: ?Current medicines are reviewed at length with the patient today.  Concerns regarding medicines are outlined above.  Medication changes, Labs and Tests ordered today are listed in the Patient Instructions below. ?Patient Instructions  ?Medication Instructions:   ? ?INCREASE Metoprolol to 75 mg (1 1/2 tablets) twice a day ? ? ? ?Keep check of heart rate on Fit Bit. Call if heart rate goes over 100 beat per minute ? ?Labwork: ?None today ? ?Testing/Procedures: ?None today ? ?Follow-Up: ?Dr.Pemb

## 2021-11-15 DIAGNOSIS — Z20822 Contact with and (suspected) exposure to covid-19: Secondary | ICD-10-CM | POA: Diagnosis not present

## 2021-11-18 ENCOUNTER — Encounter: Payer: Self-pay | Admitting: Physician Assistant

## 2021-11-18 ENCOUNTER — Ambulatory Visit (INDEPENDENT_AMBULATORY_CARE_PROVIDER_SITE_OTHER): Payer: Medicare Other | Admitting: Physician Assistant

## 2021-11-18 VITALS — BP 136/84 | HR 103 | Ht 64.5 in | Wt 169.0 lb

## 2021-11-18 DIAGNOSIS — I10 Essential (primary) hypertension: Secondary | ICD-10-CM

## 2021-11-18 DIAGNOSIS — K559 Vascular disorder of intestine, unspecified: Secondary | ICD-10-CM | POA: Diagnosis not present

## 2021-11-18 DIAGNOSIS — I4891 Unspecified atrial fibrillation: Secondary | ICD-10-CM | POA: Diagnosis not present

## 2021-11-18 MED ORDER — METOPROLOL TARTRATE 50 MG PO TABS: 75.0000 mg | ORAL_TABLET | Freq: Two times a day (BID) | ORAL | 3 refills | Status: DC

## 2021-11-18 NOTE — Patient Instructions (Signed)
Medication Instructions:  ? ?INCREASE Metoprolol to 75 mg (1 1/2 tablets) twice a day ? ? ? ?Keep check of heart rate on Fit Bit. Call if heart rate goes over 100 beat per minute ? ?Labwork: ?None today ? ?Testing/Procedures: ?None today ? ?Follow-Up: ?Dr.Pemberton next available ? ? ?Any Other Special Instructions Will Be Listed Below (If Applicable). ? ?If you need a refill on your cardiac medications before your next appointment, please call your pharmacy. ? ?

## 2021-11-20 ENCOUNTER — Telehealth: Payer: Self-pay | Admitting: Cardiology

## 2021-11-20 DIAGNOSIS — I482 Chronic atrial fibrillation, unspecified: Secondary | ICD-10-CM

## 2021-11-20 MED ORDER — ELIQUIS 5 MG PO TABS: 5.0000 mg | ORAL_TABLET | Freq: Two times a day (BID) | ORAL | 2 refills | Status: DC

## 2021-11-20 NOTE — Telephone Encounter (Signed)
Prescription refill request for Eliquis received. ?Indication: Atrial Fib ?Last office visit: 11/18/21  Leda Gauze PA-C ?Scr: 0.78 on 11/08/21 ?Age: 76 ?Weight: 76.7kg ? ?Based on above findings Eliquis 5mg  twice daily is the appropriate dose.   Refill approved. ? ?

## 2021-11-20 NOTE — Telephone Encounter (Signed)
?*  STAT* If patient is at the pharmacy, call can be transferred to refill team. ? ? ?1. Which medications need to be refilled? (please list name of each medication and dose if known)  ? ELIQUIS 5 MG TABS tablet  ? ? ?2. Which pharmacy/location (including street and city if local pharmacy) is medication to be sent to?  ?[facility] [phone] - [place], [place] - [address] AT [facility] [address] [place] [address] ?3. Do they need a 30 day or 90 day supply?  ? ?30 day ? ?

## 2021-11-20 NOTE — Telephone Encounter (Signed)
Prescription refill request for Eliquis received. ?Indication: Afib  ?Last office visit: 11/18/21 Geni Bers)  ?Scr:0.78 (11/08/21) ?Age: 76 ?Weight: 76.7kg ? ?Appropriate dose and refill sent to requested pharmacy.  ?

## 2021-11-25 ENCOUNTER — Encounter: Payer: Self-pay | Admitting: Internal Medicine

## 2021-11-27 ENCOUNTER — Telehealth: Payer: Self-pay | Admitting: Physician Assistant

## 2021-11-27 DIAGNOSIS — I482 Chronic atrial fibrillation, unspecified: Secondary | ICD-10-CM

## 2021-11-27 DIAGNOSIS — I4891 Unspecified atrial fibrillation: Secondary | ICD-10-CM

## 2021-11-27 NOTE — Telephone Encounter (Signed)
?  STAT if HR is under 50 or over 120 ?(normal HR is 60-100 beats per minute) ? ?What is your heart rate?  ? ?05/17 HR 117 ? ?Do you have a log of your heart rate readings (document readings)?  ?05/10 HR 119 ?05/11 HR 107 ?05/12 HR 103 ?05/13 and 05/14 HR 113 ?05/15 HR 116  ?05/16 HR 121 ?05/17 HR 117 ? ?Do you have any other symptoms? Pt said, she have headache and that's unusual for her. She was told to monitor her HR and give [place] a call if ts above 100 ? ? ?

## 2021-11-28 ENCOUNTER — Telehealth: Payer: Self-pay | Admitting: Cardiology

## 2021-11-28 MED ORDER — METOPROLOL TARTRATE 100 MG PO TABS: 100.0000 mg | ORAL_TABLET | Freq: Two times a day (BID) | ORAL | 3 refills | Status: DC

## 2021-11-28 NOTE — Telephone Encounter (Signed)
Pt calling to give BP readings for the last 5 days as requested. Please advise   5/13 - 148/65 5/14 - 148/65 5/15 - 151/64 5/16 - 158/64 5/17 - 152/64

## 2021-11-28 NOTE — Telephone Encounter (Signed)
Spoke to pt who stated that she will call office back with bp readings.

## 2021-11-28 NOTE — Telephone Encounter (Signed)
Please see previous phone note.  

## 2021-11-28 NOTE — Telephone Encounter (Signed)
Shelly Sprague, MD:   Sounds like she is having Afib with RVR. How is her blood pressure? If SBP>100, can we increase her metoprolol to 100mg  BID and get her in to see in clinic as soon as possible. If no appointments, we can have her seen in Afib clinic    Pt notified and verbalized understanding. Pt referred to A. Fib clinic as we have no availability with provider schedule.

## 2021-11-28 NOTE — Telephone Encounter (Signed)
Pt calling to give BP readings for the last 5 days as requested. Please advise  5/13 - 148/65 5/14 - 148/65 5/15 - 151/64 5/16 - 158/64 5/17 - 152/64

## 2021-12-25 ENCOUNTER — Ambulatory Visit (HOSPITAL_COMMUNITY)
Admission: RE | Admit: 2021-12-25 | Discharge: 2021-12-25 | Disposition: A | Discharge status: Home / Self Care | Payer: Medicare Other | Source: Ambulatory Visit | Attending: Physician Assistant | Admitting: Physician Assistant

## 2021-12-25 ENCOUNTER — Encounter (HOSPITAL_COMMUNITY): Payer: Self-pay | Admitting: Physician Assistant

## 2021-12-25 VITALS — BP 122/80 | HR 93 | Ht 64.5 in | Wt 168.8 lb

## 2021-12-25 DIAGNOSIS — I4819 Other persistent atrial fibrillation: Secondary | ICD-10-CM | POA: Insufficient documentation

## 2021-12-25 DIAGNOSIS — I700 Atherosclerosis of aorta: Secondary | ICD-10-CM | POA: Diagnosis not present

## 2021-12-25 DIAGNOSIS — K861 Other chronic pancreatitis: Secondary | ICD-10-CM | POA: Diagnosis not present

## 2021-12-25 DIAGNOSIS — Z79899 Other long term (current) drug therapy: Secondary | ICD-10-CM | POA: Insufficient documentation

## 2021-12-25 DIAGNOSIS — K559 Vascular disorder of intestine, unspecified: Secondary | ICD-10-CM | POA: Diagnosis not present

## 2021-12-25 DIAGNOSIS — I10 Essential (primary) hypertension: Secondary | ICD-10-CM | POA: Insufficient documentation

## 2021-12-25 DIAGNOSIS — D6869 Other thrombophilia: Secondary | ICD-10-CM | POA: Insufficient documentation

## 2021-12-25 DIAGNOSIS — Z7901 Long term (current) use of anticoagulants: Secondary | ICD-10-CM | POA: Diagnosis not present

## 2021-12-25 LAB — BASIC METABOLIC PANEL
Anion gap: 11 (ref 5–15)
BUN: 15 mg/dL (ref 8–23)
CO2: 20 mmol/L — ABNORMAL LOW (ref 22–32)
Calcium: 9.4 mg/dL (ref 8.9–10.3)
Chloride: 108 mmol/L (ref 98–111)
Creatinine, Ser: 1 mg/dL (ref 0.44–1.00)
GFR, Estimated: 59 mL/min — ABNORMAL LOW (ref >60)
Glucose, Bld: 100 mg/dL — ABNORMAL HIGH (ref 70–99)
Potassium: 5 mmol/L (ref 3.5–5.1)
Sodium: 139 mmol/L (ref 135–145)

## 2021-12-25 LAB — CBC
HCT: 46.6 % — ABNORMAL HIGH (ref 36.0–46.0)
Hemoglobin: 15 g/dL (ref 12.0–15.0)
MCH: 32.1 pg (ref 26.0–34.0)
MCHC: 32.2 g/dL (ref 30.0–36.0)
MCV: 99.8 fL (ref 80.0–100.0)
Platelets: 381 10*3/uL (ref 150–400)
RBC: 4.67 MIL/uL (ref 3.87–5.11)
RDW: 13.7 % (ref 11.5–15.5)
WBC: 7.6 10*3/uL (ref 4.0–10.5)
nRBC: 0 % (ref 0.0–0.2)

## 2021-12-25 NOTE — H&P (View-Only) (Signed)
Primary Care Physician: Neale Burly, MD Primary Cardiologist: Dr Johney Frame Primary Electrophysiologist: none Referring Physician: Dr Johney Frame   Shelly Maldonado is a 76 y.o. female with a history of HTN, aortic atherosclerosis, chronic pancreatitis, ischemic enteritis, atrial fibrillation who presents for consultation in the [facility].  The patient was initially diagnosed with atrial fibrillation during hospitalization in [place] from 10/11/21-10/16/21. She was started in Eliquis and metoprolol at that time. She was admitted to [facility] 10/21/21 with ischemic enteritis with splenic infarct concerning for embolus as well as pneumonia. Embolic event not felt to be an Eliquis failure as she has just started the medication. Patient has a CHADS2VASC score of 6. Plan was for TEE/DCCV after 3 weeks of anticoagulation. She was admitted for observation 11/08/21 with SOB and orthopnea, found to have bilateral pleural effusions, was diuresed. Seen by Ermalinda Barrios on 11/18/21 and her BB was increased. She has monitored her heart rates since then which have been elevated 100-120 bpm. Her BB was further increased.   Today, patient reports that overall she is feeling better. Her diarrhea has resolved and her pneumonia is also much better. She has monitored her heart rates which are better on higher dose of BB but she does still have rapid rates at times. No LEE or orthopnea. She denies any missed doses of anticoagulation.   Today, she denies symptoms of palpitations, chest pain, orthopnea, PND, lower extremity edema, dizziness, presyncope, syncope, snoring, daytime somnolence, bleeding, or neurologic sequela. The patient is tolerating medications without difficulties and is otherwise without complaint today.    Atrial Fibrillation Risk Factors:  she does not have symptoms or diagnosis of sleep apnea. she does not have a history of rheumatic fever. she does not have a history of  alcohol use. The patient does not have a history of early familial atrial fibrillation or other arrhythmias.  she has a BMI of Body mass index is 28.53 kg/m.Marland Kitchen Filed Weights   12/25/21 1304  Weight: 76.6 kg    Family History  Problem Relation Age of Onset   Cancer Mother    Heart attack Father    Colon cancer Neg Hx      Atrial Fibrillation Management history:  Previous antiarrhythmic drugs: none Previous cardioversions: none Previous ablations: none CHADS2VASC score: 6 Anticoagulation history: Eliquis   Past Medical History:  Diagnosis Date   Atrial fibrillation ([place])    Hypertension    Pancreatitis    Shingles    Past Surgical History:  Procedure Laterality Date   CATARACT EXTRACTION Bilateral 2016   CHOLECYSTECTOMY     COLONOSCOPY N/A 03/04/2018   Procedure: COLONOSCOPY;  Surgeon: Rogene Houston, MD;  [facility];  Service: Endoscopy;  Laterality: N/A;  12:45   complete hysterecomy      Current Outpatient Medications  Medication Sig Dispense Refill   albuterol (VENTOLIN HFA) 108 (90 Base) MCG/ACT inhaler Inhale 2 puffs into the lungs every 6 (six) hours as needed for wheezing or shortness of breath. 8 g 2   CREON 36000-114000 units CPEP capsule Take 1 capsule by mouth 3 (three) times daily before meals.     ELIQUIS 5 MG TABS tablet Take 1 tablet (5 mg total) by mouth 2 (two) times daily. 60 tablet 2   levocetirizine (XYZAL) 5 MG tablet Take 5 mg by mouth at bedtime.     loperamide (IMODIUM) 2 MG capsule Take 1 capsule (2 mg total) by mouth every 6 (six) hours as needed  for diarrhea or loose stools. 30 capsule 0   metoprolol tartrate (LOPRESSOR) 100 MG tablet Take 1 tablet (100 mg total) by mouth 2 (two) times daily. 180 tablet 3   No current facility-administered medications for this encounter.    Allergies  Allergen Reactions   Codeine Other (See Comments)    unknown   Morphine And Related     Vein became red    Social History    Socioeconomic History   Marital status: Divorced    Spouse name: Not on file   Number of children: Not on file   Years of education: Not on file   Highest education level: Not on file  Occupational History   Not on file  Tobacco Use   Smoking status: Former   Smokeless tobacco: Never   Tobacco comments:    Former smoker 12/25/21 smoked 48 yrs ago.   Vaping Use   Vaping Use: Never used  Substance and Sexual Activity   Alcohol use: Never   Drug use: Never   Sexual activity: Not on file  Other Topics Concern   Not on file  Social History Narrative   Not on file   Social Determinants of Health   Financial Resource Strain: Not on file  Food Insecurity: Not on file  Transportation Needs: Not on file  Physical Activity: Not on file  Stress: Not on file  Social Connections: Not on file  Intimate Partner Violence: Not on file     ROS- All systems are reviewed and negative except as per the HPI above.  Physical Exam: Vitals:   12/25/21 1304  BP: 122/80  Pulse: 93  Weight: 76.6 kg  Height: 5' 4.5" (1.638 m)    GEN- The patient is a well appearing elderly female, alert and oriented x 3 today.   Head- normocephalic, atraumatic Eyes-  Sclera clear, conjunctiva pink Ears- hearing intact Oropharynx- clear Neck- supple  Lungs- Clear to ausculation bilaterally, normal work of breathing Heart- irregular rate and rhythm, no murmurs, rubs or gallops  GI- soft, NT, ND, + BS Extremities- no clubbing, cyanosis, or edema MS- no significant deformity or atrophy Skin- no rash or lesion Psych- euthymic mood, full affect Neuro- strength and sensation are intact  Wt Readings from Last 3 Encounters:  12/25/21 76.6 kg  11/18/21 76.7 kg  11/08/21 77.2 kg    EKG today demonstrates  Atypical atrial flutter with variable block vs coarse afib Vent. rate 93 BPM PR interval * ms QRS duration 66 ms QT/QTcB 354/440 ms  Echo 10/22/21 demonstrated   1. Left ventricular ejection  fraction, by estimation, is 60 to 65%. The  left ventricle has normal function. The left ventricle has no regional  wall motion abnormalities. There is mild left ventricular hypertrophy of  the basal-septal segment. Left ventricular diastolic function could not be evaluated.   2. Right ventricular systolic function is normal. The right ventricular  size is normal. There is moderately elevated pulmonary artery systolic  pressure. The estimated right ventricular systolic pressure is [id number] mmHg.   3. The pericardial effusion is anterior to the right ventricle.   4. The mitral valve is normal in structure. Trivial mitral valve  regurgitation. No evidence of mitral stenosis.   5. The aortic valve is normal in structure. Aortic valve regurgitation is  not visualized. No aortic stenosis is present.   6. The inferior vena cava is normal in size with greater than 50%  respiratory variability, suggesting right atrial pressure of 3 mmHg.  Epic records are reviewed at length today  CHA2DS2-VASc Score = 6  The patient's score is based upon: CHF History: 0 HTN History: 1 Diabetes History: 0 Stroke History: 2 (ischemic enteritis and splenic infarct (embolic)) Vascular Disease History: 1 (aortic atherosclerosis) Age Score: 1 Gender Score: 1       ASSESSMENT AND PLAN: 1. Persistent Atrial Fibrillation (ICD10:  I48.19) The patient's CHA2DS2-VASc score is 6, indicating a 9.7% annual risk of stroke.   General education about afib provided and questions answered. We also discussed her stroke risk and the risks and benefits of anticoagulation. We discussed rhythm control options today. Will plan for TEE guided DCCV (given history of embolic infarct).  Continue Eliquis 5 mg BID, patient denies any missed doses in the past 3 weeks.  Check bmet/cbc today.  Continue Lopressor 100 mg BID  2. Secondary Hypercoagulable State (ICD10:  D68.69) The patient is at significant risk for stroke/thromboembolism  based upon her CHA2DS2-VASc Score of 6.  Continue Apixaban (Eliquis).   3. HTN Stable, no changes today.   Follow up with Dr Johney Frame as scheduled.    [facility] [address] [place], [place] [address] [phone] 12/25/2021 1:34 PM

## 2021-12-25 NOTE — Progress Notes (Signed)
Primary Care Physician: Neale Burly, MD Primary Cardiologist: Dr Johney Frame Primary Electrophysiologist: none Referring Physician: Dr Johney Frame   Shelly Maldonado is a 76 y.o. female with a history of HTN, aortic atherosclerosis, chronic pancreatitis, ischemic enteritis, atrial fibrillation who presents for consultation in the [facility].  The patient was initially diagnosed with atrial fibrillation during hospitalization in [place] from 10/11/21-10/16/21. She was started in Eliquis and metoprolol at that time. She was admitted to [facility] 10/21/21 with ischemic enteritis with splenic infarct concerning for embolus as well as pneumonia. Embolic event not felt to be an Eliquis failure as she has just started the medication. Patient has a CHADS2VASC score of 6. Plan was for TEE/DCCV after 3 weeks of anticoagulation. She was admitted for observation 11/08/21 with SOB and orthopnea, found to have bilateral pleural effusions, was diuresed. Seen by Ermalinda Barrios on 11/18/21 and her BB was increased. She has monitored her heart rates since then which have been elevated 100-120 bpm. Her BB was further increased.   Today, patient reports that overall she is feeling better. Her diarrhea has resolved and her pneumonia is also much better. She has monitored her heart rates which are better on higher dose of BB but she does still have rapid rates at times. No LEE or orthopnea. She denies any missed doses of anticoagulation.   Today, she denies symptoms of palpitations, chest pain, orthopnea, PND, lower extremity edema, dizziness, presyncope, syncope, snoring, daytime somnolence, bleeding, or neurologic sequela. The patient is tolerating medications without difficulties and is otherwise without complaint today.    Atrial Fibrillation Risk Factors:  she does not have symptoms or diagnosis of sleep apnea. she does not have a history of rheumatic fever. she does not have a history of  alcohol use. The patient does not have a history of early familial atrial fibrillation or other arrhythmias.  she has a BMI of Body mass index is 28.53 kg/m.Marland Kitchen Filed Weights   12/25/21 1304  Weight: 76.6 kg    Family History  Problem Relation Age of Onset   Cancer Mother    Heart attack Father    Colon cancer Neg Hx      Atrial Fibrillation Management history:  Previous antiarrhythmic drugs: none Previous cardioversions: none Previous ablations: none CHADS2VASC score: 6 Anticoagulation history: Eliquis   Past Medical History:  Diagnosis Date   Atrial fibrillation ([place])    Hypertension    Pancreatitis    Shingles    Past Surgical History:  Procedure Laterality Date   CATARACT EXTRACTION Bilateral 2016   CHOLECYSTECTOMY     COLONOSCOPY N/A 03/04/2018   Procedure: COLONOSCOPY;  Surgeon: Rogene Houston, MD;  Location: [facility];  Service: Endoscopy;  Laterality: N/A;  12:45   complete hysterecomy      Current Outpatient Medications  Medication Sig Dispense Refill   albuterol (VENTOLIN HFA) 108 (90 Base) MCG/ACT inhaler Inhale 2 puffs into the lungs every 6 (six) hours as needed for wheezing or shortness of breath. 8 g 2   CREON 36000-114000 units CPEP capsule Take 1 capsule by mouth 3 (three) times daily before meals.     ELIQUIS 5 MG TABS tablet Take 1 tablet (5 mg total) by mouth 2 (two) times daily. 60 tablet 2   levocetirizine (XYZAL) 5 MG tablet Take 5 mg by mouth at bedtime.     loperamide (IMODIUM) 2 MG capsule Take 1 capsule (2 mg total) by mouth every 6 (six) hours as needed  for diarrhea or loose stools. 30 capsule 0   metoprolol tartrate (LOPRESSOR) 100 MG tablet Take 1 tablet (100 mg total) by mouth 2 (two) times daily. 180 tablet 3   No current facility-administered medications for this encounter.    Allergies  Allergen Reactions   Codeine Other (See Comments)    unknown   Morphine And Related     Vein became red    Social History    Socioeconomic History   Marital status: Divorced    Spouse name: Not on file   Number of children: Not on file   Years of education: Not on file   Highest education level: Not on file  Occupational History   Not on file  Tobacco Use   Smoking status: Former   Smokeless tobacco: Never   Tobacco comments:    Former smoker 12/25/21 smoked 48 yrs ago.   Vaping Use   Vaping Use: Never used  Substance and Sexual Activity   Alcohol use: Never   Drug use: Never   Sexual activity: Not on file  Other Topics Concern   Not on file  Social History Narrative   Not on file   Social Determinants of Health   Financial Resource Strain: Not on file  Food Insecurity: Not on file  Transportation Needs: Not on file  Physical Activity: Not on file  Stress: Not on file  Social Connections: Not on file  Intimate Partner Violence: Not on file     ROS- All systems are reviewed and negative except as per the HPI above.  Physical Exam: Vitals:   12/25/21 1304  BP: 122/80  Pulse: 93  Weight: 76.6 kg  Height: 5' 4.5" (1.638 m)    GEN- The patient is a well appearing elderly female, alert and oriented x 3 today.   Head- normocephalic, atraumatic Eyes-  Sclera clear, conjunctiva pink Ears- hearing intact Oropharynx- clear Neck- supple  Lungs- Clear to ausculation bilaterally, normal work of breathing Heart- irregular rate and rhythm, no murmurs, rubs or gallops  GI- soft, NT, ND, + BS Extremities- no clubbing, cyanosis, or edema MS- no significant deformity or atrophy Skin- no rash or lesion Psych- euthymic mood, full affect Neuro- strength and sensation are intact  Wt Readings from Last 3 Encounters:  12/25/21 76.6 kg  11/18/21 76.7 kg  11/08/21 77.2 kg    EKG today demonstrates  Atypical atrial flutter with variable block vs coarse afib Vent. rate 93 BPM PR interval * ms QRS duration 66 ms QT/QTcB 354/440 ms  Echo 10/22/21 demonstrated   1. Left ventricular ejection  fraction, by estimation, is 60 to 65%. The  left ventricle has normal function. The left ventricle has no regional  wall motion abnormalities. There is mild left ventricular hypertrophy of  the basal-septal segment. Left ventricular diastolic function could not be evaluated.   2. Right ventricular systolic function is normal. The right ventricular  size is normal. There is moderately elevated pulmonary artery systolic  pressure. The estimated right ventricular systolic pressure is [id number] mmHg.   3. The pericardial effusion is anterior to the right ventricle.   4. The mitral valve is normal in structure. Trivial mitral valve  regurgitation. No evidence of mitral stenosis.   5. The aortic valve is normal in structure. Aortic valve regurgitation is  not visualized. No aortic stenosis is present.   6. The inferior vena cava is normal in size with greater than 50%  respiratory variability, suggesting right atrial pressure of 3 mmHg.  Epic records are reviewed at length today  CHA2DS2-VASc Score = 6  The patient's score is based upon: CHF History: 0 HTN History: 1 Diabetes History: 0 Stroke History: 2 (ischemic enteritis and splenic infarct (embolic)) Vascular Disease History: 1 (aortic atherosclerosis) Age Score: 1 Gender Score: 1       ASSESSMENT AND PLAN: 1. Persistent Atrial Fibrillation (ICD10:  I48.19) The patient's CHA2DS2-VASc score is 6, indicating a 9.7% annual risk of stroke.   General education about afib provided and questions answered. We also discussed her stroke risk and the risks and benefits of anticoagulation. We discussed rhythm control options today. Will plan for TEE guided DCCV (given history of embolic infarct).  Continue Eliquis 5 mg BID, patient denies any missed doses in the past 3 weeks.  Check bmet/cbc today.  Continue Lopressor 100 mg BID  2. Secondary Hypercoagulable State (ICD10:  D68.69) The patient is at significant risk for stroke/thromboembolism  based upon her CHA2DS2-VASc Score of 6.  Continue Apixaban (Eliquis).   3. HTN Stable, no changes today.   Follow up with Dr Johney Frame as scheduled.    [facility] [address] [place], [place] [address] [phone] 12/25/2021 1:34 PM

## 2021-12-25 NOTE — Patient Instructions (Signed)
Cardioversion scheduled for Thursday, June 22nd  - Arrive at the [facility] and go to admitting at [place] not eat or drink anything after midnight the night prior to your procedure.  - Take all your morning medication (except diabetic medications) with a sip of water prior to arrival.  - You will not be able to drive home after your procedure.  - Do NOT miss any doses of your blood thinner - if you should miss a dose please notify our office immediately.  - If you feel as if you go back into normal rhythm prior to scheduled cardioversion, please notify our office immediately. If your procedure is canceled in the cardioversion suite you will be charged a cancellation fee.

## 2021-12-31 ENCOUNTER — Encounter (HOSPITAL_COMMUNITY): Payer: Self-pay | Admitting: Internal Medicine

## 2021-12-31 NOTE — Progress Notes (Signed)
Attempted to obtain medical history via telephone, unable to reach at this time. HIPAA compliant voicemail message left requesting return call to pre surgical testing department. 

## 2022-01-02 ENCOUNTER — Ambulatory Visit (HOSPITAL_COMMUNITY)
Admission: RE | Admit: 2022-01-02 | Discharge: 2022-01-02 | Disposition: A | Discharge status: Home / Self Care | Payer: Medicare Other | Attending: Internal Medicine | Admitting: Internal Medicine

## 2022-01-02 ENCOUNTER — Encounter (HOSPITAL_COMMUNITY): Payer: Self-pay | Admitting: Internal Medicine

## 2022-01-02 ENCOUNTER — Ambulatory Visit (HOSPITAL_BASED_OUTPATIENT_CLINIC_OR_DEPARTMENT_OTHER): Payer: Medicare Other | Admitting: Anesthesiology

## 2022-01-02 ENCOUNTER — Encounter (HOSPITAL_COMMUNITY)
Admission: RE | Disposition: A | Discharge status: Home / Self Care | Payer: Self-pay | Source: Home / Self Care | Attending: Internal Medicine

## 2022-01-02 ENCOUNTER — Other Ambulatory Visit: Payer: Self-pay

## 2022-01-02 ENCOUNTER — Ambulatory Visit (HOSPITAL_COMMUNITY): Payer: Medicare Other | Admitting: Anesthesiology

## 2022-01-02 DIAGNOSIS — I10 Essential (primary) hypertension: Secondary | ICD-10-CM

## 2022-01-02 DIAGNOSIS — K559 Vascular disorder of intestine, unspecified: Secondary | ICD-10-CM | POA: Diagnosis not present

## 2022-01-02 DIAGNOSIS — I4891 Unspecified atrial fibrillation: Secondary | ICD-10-CM | POA: Diagnosis not present

## 2022-01-02 DIAGNOSIS — J449 Chronic obstructive pulmonary disease, unspecified: Secondary | ICD-10-CM | POA: Insufficient documentation

## 2022-01-02 DIAGNOSIS — N289 Disorder of kidney and ureter, unspecified: Secondary | ICD-10-CM | POA: Insufficient documentation

## 2022-01-02 DIAGNOSIS — D6869 Other thrombophilia: Secondary | ICD-10-CM | POA: Diagnosis not present

## 2022-01-02 DIAGNOSIS — Z7901 Long term (current) use of anticoagulants: Secondary | ICD-10-CM | POA: Diagnosis not present

## 2022-01-02 DIAGNOSIS — I4819 Other persistent atrial fibrillation: Secondary | ICD-10-CM

## 2022-01-02 DIAGNOSIS — Z79899 Other long term (current) drug therapy: Secondary | ICD-10-CM | POA: Insufficient documentation

## 2022-01-02 DIAGNOSIS — Z6832 Body mass index (BMI) 32.0-32.9, adult: Secondary | ICD-10-CM | POA: Diagnosis not present

## 2022-01-02 DIAGNOSIS — Z6828 Body mass index (BMI) 28.0-28.9, adult: Secondary | ICD-10-CM | POA: Diagnosis not present

## 2022-01-02 DIAGNOSIS — I700 Atherosclerosis of aorta: Secondary | ICD-10-CM | POA: Diagnosis not present

## 2022-01-02 DIAGNOSIS — Z87891 Personal history of nicotine dependence: Secondary | ICD-10-CM | POA: Diagnosis not present

## 2022-01-02 DIAGNOSIS — K861 Other chronic pancreatitis: Secondary | ICD-10-CM | POA: Insufficient documentation

## 2022-01-02 HISTORY — PX: CARDIOVERSION: SHX1299

## 2022-01-02 SURGERY — CARDIOVERSION
Anesthesia: General

## 2022-01-02 MED ORDER — PROPOFOL 10 MG/ML IV BOLUS
INTRAVENOUS | Status: DC | PRN
  Administered 2022-01-02: 70 mg via INTRAVENOUS

## 2022-01-02 MED ORDER — SODIUM CHLORIDE 0.9 % IV SOLN: INTRAVENOUS | Status: DC

## 2022-01-02 MED ORDER — LIDOCAINE 2% (20 MG/ML) 5 ML SYRINGE
INTRAMUSCULAR | Status: DC | PRN
  Administered 2022-01-02: 40 mg via INTRAVENOUS

## 2022-01-02 NOTE — CV Procedure (Signed)
   CARDIOVERSION NOTE  Procedure: Electrical Cardioversion Indications:  Atrial Fibrillation  Procedure Details:  Consent: Risks of procedure as well as the alternatives and risks of each were explained to the (patient/caregiver).  Consent for procedure obtained.  Time Out: Verified patient identification, verified procedure, site/side was marked, verified correct patient position, special equipment/implants available, medications/allergies/relevent history reviewed, required imaging and test results available.  Performed  Patient placed on cardiac monitor, pulse oximetry, supplemental oxygen as necessary.  Sedation given:  propofol per anesthesia Pacer pads placed anterior and posterior chest.  Cardioverted 1 time(s).  Cardioverted at 150J biphasic.  Impression: Findings: Post procedure EKG shows: NSR Complications: None Patient did tolerate procedure well.  Plan: Successful DCCV with a single 150J Biphasic shock to NSR.  Time Spent Directly with the Patient:  30 minutes   Chrystie Nose, MD, [facility], FACP  [place]  [facility]  Medical Director of the [facility] Lip[facility] &  Cardiovascular Risk Reduction Clinic Diplomate of the American Board of Clinical Lipidology Attending Cardiologist  Direct Dial: [phone]  Fax: [phone]  Website:  [email or web address] 01/02/2022, 10:36 AM

## 2022-01-02 NOTE — Anesthesia Preprocedure Evaluation (Addendum)
Anesthesia Evaluation  Patient identified by MRN, date of birth, ID band Patient awake    Reviewed: Allergy & Precautions, NPO status , Patient's Chart, lab work & pertinent test results, reviewed documented beta blocker date and time   History of Anesthesia Complications Negative for: history of anesthetic complications  Airway Mallampati: II  TM Distance: >3 FB Neck ROM: Full    Dental  (+) Dental Advisory Given   Pulmonary COPD,  COPD inhaler, former smoker,    breath sounds clear to auscultation       Cardiovascular hypertension, Pt. on medications and Pt. on home beta blockers (-) angina+ dysrhythmias Atrial Fibrillation  Rhythm:Irregular Rate:Normal  10/2021 ECHO: EF 60-65%. The LV has normal function, no regional wall motion abnormalities. The left ventricular internal cavity size was normal in size. There ismild LVH, trivial pericardial effusion, no significant valvular abnormalities   Neuro/Psych negative neurological ROS     GI/Hepatic negative GI ROS, Neg liver ROS,   Endo/Other  Morbid obesity  Renal/GU Renal InsufficiencyRenal disease     Musculoskeletal   Abdominal (+) + obese,   Peds  Hematology eliquis   Anesthesia Other Findings   Reproductive/Obstetrics                            Anesthesia Physical Anesthesia Plan  ASA: 3  Anesthesia Plan: General   Post-op Pain Management: Minimal or no pain anticipated   Induction:   PONV Risk Score and Plan: 3 and Treatment may vary due to age or medical condition  Airway Management Planned: Natural Airway and Mask  Additional Equipment: None  Intra-op Plan:   Post-operative Plan:   Informed Consent: I have reviewed the patients History and Physical, chart, labs and discussed the procedure including the risks, benefits and alternatives for the proposed anesthesia with the patient or authorized representative who has  indicated his/her understanding and acceptance.     Dental advisory given  Plan Discussed with: CRNA and Surgeon  Anesthesia Plan Comments:        Anesthesia Quick Evaluation

## 2022-01-02 NOTE — Transfer of Care (Signed)
Immediate Anesthesia Transfer of Care Note  Patient: Shelly Maldonado  Procedure(s) Performed: CARDIOVERSION  Patient Location: Endoscopy Unit  Anesthesia Type:General  Level of Consciousness: awake, alert  and oriented  Airway & Oxygen Therapy: Patient Spontanous Breathing  Post-op Assessment: Report given to RN and Post -op Vital signs reviewed and stable  Post vital signs: Reviewed and stable  Last Vitals:  Vitals Value Taken Time  BP    Temp    Pulse    Resp    SpO2      Last Pain:  Vitals:   01/02/22 0951  TempSrc: Temporal  PainSc: 0-No pain         Complications: No notable events documented.

## 2022-01-02 NOTE — Discharge Instructions (Signed)

## 2022-01-02 NOTE — Anesthesia Procedure Notes (Signed)
Procedure Name: General with mask airway Date/Time: 01/02/2022 10:30 AM  Performed by: Marena Chancy, CRNAPre-anesthesia Checklist: Timeout performed, Patient being monitored, Suction available and Emergency Drugs available Patient Re-evaluated:Patient Re-evaluated prior to induction Oxygen Delivery Method: Ambu bag Preoxygenation: Pre-oxygenation with 100% oxygen Induction Type: IV induction Ventilation: Mask ventilation without difficulty

## 2022-01-02 NOTE — Interval H&P Note (Signed)
History and Physical Interval Note:  01/02/2022 10:24 AM  Shelly Maldonado  has presented today for surgery, with the diagnosis of AFIB.  The various methods of treatment have been discussed with the patient and family. After consideration of risks, benefits and other options for treatment, the patient has consented to  Procedure(s): CARDIOVERSION (N/A) as a surgical intervention.  The patient's history has been reviewed, patient examined, no change in status, stable for surgery.  I have reviewed the patient's chart and labs.  Questions were answered to the patient's satisfaction.     Chrystie Nose

## 2022-01-02 NOTE — Anesthesia Postprocedure Evaluation (Signed)
Anesthesia Post Note  Patient: Shelly Maldonado  Procedure(s) Performed: CARDIOVERSION     Patient location during evaluation: Endoscopy Anesthesia Type: General Level of consciousness: awake and alert, patient cooperative and oriented Pain management: pain level controlled Vital Signs Assessment: post-procedure vital signs reviewed and stable Respiratory status: nonlabored ventilation, spontaneous breathing and respiratory function stable Cardiovascular status: blood pressure returned to baseline and stable Postop Assessment: no apparent nausea or vomiting and able to ambulate Anesthetic complications: no   No notable events documented.  Last Vitals:  Vitals:   01/02/22 1049 01/02/22 1055  BP: (!) 100/45 (!) 99/56  Pulse: (!) 48 (!) 49  Resp: 18 16  Temp:    SpO2: 94% 94%    Last Pain:  Vitals:   01/02/22 1055  TempSrc:   PainSc: 0-No pain                 Rasean Joos,E. Madsen Riddle

## 2022-01-03 ENCOUNTER — Encounter (HOSPITAL_COMMUNITY): Payer: Self-pay | Admitting: Internal Medicine

## 2022-01-15 ENCOUNTER — Encounter (HOSPITAL_COMMUNITY): Payer: Self-pay | Admitting: Physician Assistant

## 2022-01-15 ENCOUNTER — Ambulatory Visit (HOSPITAL_COMMUNITY)
Admission: RE | Admit: 2022-01-15 | Discharge: 2022-01-15 | Disposition: A | Discharge status: Home / Self Care | Payer: Medicare Other | Source: Ambulatory Visit | Attending: Physician Assistant | Admitting: Physician Assistant

## 2022-01-15 VITALS — BP 128/78 | HR 87 | Ht 64.0 in | Wt 167.0 lb

## 2022-01-15 DIAGNOSIS — Z79899 Other long term (current) drug therapy: Secondary | ICD-10-CM | POA: Insufficient documentation

## 2022-01-15 DIAGNOSIS — Z7901 Long term (current) use of anticoagulants: Secondary | ICD-10-CM | POA: Diagnosis not present

## 2022-01-15 DIAGNOSIS — Z8249 Family history of ischemic heart disease and other diseases of the circulatory system: Secondary | ICD-10-CM | POA: Diagnosis not present

## 2022-01-15 DIAGNOSIS — I10 Essential (primary) hypertension: Secondary | ICD-10-CM | POA: Diagnosis not present

## 2022-01-15 DIAGNOSIS — D6869 Other thrombophilia: Secondary | ICD-10-CM | POA: Insufficient documentation

## 2022-01-15 DIAGNOSIS — I4819 Other persistent atrial fibrillation: Secondary | ICD-10-CM | POA: Insufficient documentation

## 2022-01-15 NOTE — Progress Notes (Signed)
Primary Care Physician: Toma Deiters, MD Primary Cardiologist: Dr Shari Prows Primary Electrophysiologist: none Referring Physician: Dr Shari Prows   Shelly Maldonado is a 76 y.o. female with a history of HTN, aortic atherosclerosis, chronic pancreatitis, ischemic enteritis, atrial fibrillation who presents for follow up in the [facility].  The patient was initially diagnosed with atrial fibrillation during hospitalization in [place] from 10/11/21-10/16/21. She was started in Eliquis and metoprolol at that time. She was admitted to [facility] 10/21/21 with ischemic enteritis with splenic infarct concerning for embolus as well as pneumonia. Embolic event not felt to be an Eliquis failure as she has just started the medication. Patient has a CHADS2VASC score of 6. Plan was for TEE/DCCV after 3 weeks of anticoagulation. She was admitted for observation 11/08/21 with SOB and orthopnea, found to have bilateral pleural effusions, was diuresed. Seen by Jacolyn Reedy on 11/18/21 and her BB was increased. She has monitored her heart rates since then which have been elevated 100-120 bpm. Her BB was further increased.   On follow up today, patient is s/p DCCV on 01/02/22. TEE was not performed. Unfortunately, she went back out of rhythm on on 01/09/22 with symptoms of intermittent lightheadedness and fatigue. She used her sister's smart watch to check and it showed rate controlled afib. Patient did report that she may have missed a dose of Eliquis 01/12/22 (she found a pill on her bed).   Today, she denies symptoms of palpitations, chest pain, orthopnea, PND, lower extremity edema, presyncope, syncope, snoring, daytime somnolence, bleeding, or neurologic sequela. The patient is tolerating medications without difficulties and is otherwise without complaint today.    Atrial Fibrillation Risk Factors:  she does not have symptoms or diagnosis of sleep apnea. she does not have a history of  rheumatic fever. she does not have a history of alcohol use. The patient does not have a history of early familial atrial fibrillation or other arrhythmias.  she has a BMI of Body mass index is 28.67 kg/m.Marland Kitchen Filed Weights   01/15/22 1129  Weight: 75.8 kg     Family History  Problem Relation Age of Onset   Cancer Mother    Heart attack Father    Colon cancer Neg Hx      Atrial Fibrillation Management history:  Previous antiarrhythmic drugs: none Previous cardioversions: 01/02/22 Previous ablations: none CHADS2VASC score: 6 Anticoagulation history: Eliquis   Past Medical History:  Diagnosis Date   Atrial fibrillation ([facility])    Hypertension    Pancreatitis    Shingles    Past Surgical History:  Procedure Laterality Date   CARDIOVERSION N/A 01/02/2022   Procedure: CARDIOVERSION;  Surgeon: Chrystie Nose, MD;  Location: [facility] ENDOSCOPY;  Service: Cardiovascular;  Laterality: N/A;   CATARACT EXTRACTION Bilateral 2016   CHOLECYSTECTOMY     COLONOSCOPY N/A 03/04/2018   Procedure: COLONOSCOPY;  Surgeon: Malissa Hippo, MD;  [facility];  Service: Endoscopy;  Laterality: N/A;  12:45   complete hysterecomy      Current Outpatient Medications  Medication Sig Dispense Refill   albuterol (VENTOLIN HFA) 108 (90 Base) MCG/ACT inhaler Inhale 2 puffs into the lungs every 6 (six) hours as needed for wheezing or shortness of breath. 8 g 2   CREON 36000-114000 units CPEP capsule Take 1 capsule by mouth 3 (three) times daily before meals.     ELIQUIS 5 MG TABS tablet Take 1 tablet (5 mg total) by mouth 2 (two) times daily. 60 tablet 2  levocetirizine (XYZAL) 5 MG tablet Take 5 mg by mouth at bedtime.     loperamide (IMODIUM) 2 MG capsule Take 1 capsule (2 mg total) by mouth every 6 (six) hours as needed for diarrhea or loose stools. 30 capsule 0   metoprolol tartrate (LOPRESSOR) 100 MG tablet Take 1 tablet (100 mg total) by mouth 2 (two) times daily. 180 tablet 3   Multiple  Vitamins-Minerals (HAIR SKIN AND NAILS FORMULA) TABS Take 3 tablets by mouth daily.     No current facility-administered medications for this encounter.    Allergies  Allergen Reactions   Codeine Other (See Comments)    Unknown reaction, tolerates hydrocodone    Morphine And Related     Vein became red    Social History   Socioeconomic History   Marital status: Divorced    Spouse name: Not on file   Number of children: Not on file   Years of education: Not on file   Highest education level: Not on file  Occupational History   Not on file  Tobacco Use   Smoking status: Former   Smokeless tobacco: Never   Tobacco comments:    Former smoker 12/25/21 smoked 48 yrs ago.   Vaping Use   Vaping Use: Never used  Substance and Sexual Activity   Alcohol use: Never   Drug use: Never   Sexual activity: Not on file  Other Topics Concern   Not on file  Social History Narrative   Not on file   Social Determinants of Health   Financial Resource Strain: Not on file  Food Insecurity: Not on file  Transportation Needs: Not on file  Physical Activity: Not on file  Stress: Not on file  Social Connections: Not on file  Intimate Partner Violence: Not on file     ROS- All systems are reviewed and negative except as per the HPI above.  Physical Exam: Vitals:   01/15/22 1129  BP: 128/78  Pulse: 87  Weight: 75.8 kg  Height: 5\' 4"  (1.626 m)     GEN- The patient is a well appearing elderly female, alert and oriented x 3 today.   HEENT-head normocephalic, atraumatic, sclera clear, conjunctiva pink, hearing intact, trachea midline. Lungs- Clear to ausculation bilaterally, normal work of breathing Heart- irregular rate and rhythm, no murmurs, rubs or gallops  GI- soft, NT, ND, + BS Extremities- no clubbing, cyanosis, or edema MS- no significant deformity or atrophy Skin- no rash or lesion Psych- euthymic mood, full affect Neuro- strength and sensation are intact   Wt  Readings from Last 3 Encounters:  01/15/22 75.8 kg  01/02/22 85.3 kg  12/25/21 76.6 kg    EKG today demonstrates  Coarse afib Vent. rate 87 BPM PR interval * ms QRS duration 68 ms QT/QTcB 382/459 ms  Echo 10/22/21 demonstrated   1. Left ventricular ejection fraction, by estimation, is 60 to 65%. The  left ventricle has normal function. The left ventricle has no regional  wall motion abnormalities. There is mild left ventricular hypertrophy of  the basal-septal segment. Left ventricular diastolic function could not be evaluated.   2. Right ventricular systolic function is normal. The right ventricular  size is normal. There is moderately elevated pulmonary artery systolic  pressure. The estimated right ventricular systolic pressure is 50.1 mmHg.   3. The pericardial effusion is anterior to the right ventricle.   4. The mitral valve is normal in structure. Trivial mitral valve  regurgitation. No evidence of mitral stenosis.  5. The aortic valve is normal in structure. Aortic valve regurgitation is  not visualized. No aortic stenosis is present.   6. The inferior vena cava is normal in size with greater than 50%  respiratory variability, suggesting right atrial pressure of 3 mmHg.   Epic records are reviewed at length today  CHA2DS2-VASc Score = 7  The patient's score is based upon: CHF History: 0 HTN History: 1 Diabetes History: 0 Stroke History: 2 (ischemic enteritis and splenic infarct (embolic)) Vascular Disease History: 1 (aortic atherosclerosis) Age Score: 2 Gender Score: 1       ASSESSMENT AND PLAN: 1. Persistent Atrial Fibrillation (ICD10:  I48.19) The patient's CHA2DS2-VASc score is 7, indicating a 11.2% annual risk of stroke.   S/p DCCV on 01/02/22 with early return of afib.  We discussed rhythm control options today including AAD (dofetilide, amiodarone) and ablation. Patient agreeable to discussing ablation with EP, will refer. We discussed using amiodarone as a  bridge to ablation. Would not start now given her possible missed dose of Eliquis on 7/2 to avoid premature chemical conversion.  Continue Eliquis 5 mg BID Continue Lopressor 100 mg BID  2. Secondary Hypercoagulable State (ICD10:  D68.69) The patient is at significant risk for stroke/thromboembolism based upon her CHA2DS2-VASc Score of 7.  Continue Apixaban (Eliquis).   3. HTN Stable, no changes today.   Follow up with Dr Shari Prows as scheduled, referred to EP for ablation evaluation.    [name] PA-C [facility] [address] [place], [place] [address] [phone] 01/15/2022 12:16 PM

## 2022-01-22 DIAGNOSIS — I10 Essential (primary) hypertension: Secondary | ICD-10-CM | POA: Diagnosis not present

## 2022-01-22 DIAGNOSIS — I4891 Unspecified atrial fibrillation: Secondary | ICD-10-CM | POA: Diagnosis not present

## 2022-01-22 DIAGNOSIS — K861 Other chronic pancreatitis: Secondary | ICD-10-CM | POA: Diagnosis not present

## 2022-01-22 DIAGNOSIS — E785 Hyperlipidemia, unspecified: Secondary | ICD-10-CM | POA: Diagnosis not present

## 2022-01-22 DIAGNOSIS — H81399 Other peripheral vertigo, unspecified ear: Secondary | ICD-10-CM | POA: Diagnosis not present

## 2022-01-22 DIAGNOSIS — F3342 Major depressive disorder, recurrent, in full remission: Secondary | ICD-10-CM | POA: Diagnosis not present

## 2022-02-07 NOTE — Progress Notes (Unsigned)
Cardiology Office Note:    Date:  02/13/2022   ID:  Shelly Maldonado, DOB 1946/03/31, MRN [id number]  PCP:  Toma Deiters, MD    [facility] Providers Cardiologist:  Meriam Sprague, MD {    Referring MD: Toma Deiters, MD    History of Present Illness:    Shelly Maldonado is a 76 y.o. female with a hx of chronic Afib, HTN, CKD IIIa, and chronic pancreatitis who presents to clinic for follow-up.  Patient was seen in [facility] in 10/2021 where she presented with abdominal pain, nausea, diarrhea, and SOB found to have ischemic enteritis and lobar pneumonia. CT of the abdomen and pelvis showed suspicion of jejunal ischemia with abnormal bowel thickening involving multiple loops of the jejunum associated with suspected emboli in the jejunal branches of the SMA.  There was also a small acute splenic infarct. The patient was started on IV heparin and Zosyn. Presentation thought to be due to embolic phenomena in the setting of Afib. TTE 10/22/21 with normal BiV function, moderate pulmonary HTN, trivial percardial effusion, no significant valve disease, RAP 3. Cardiology was consulted at that time for Afib with RVR. She was maintained on rate control given concern for LAA thrombus due to embolic phenomena. She was seen in follow-up by the Afib clinic in 12/2021 and ultimately underwent DCCV on 01/02/22.  Was last seen in Afib clinic on 01/15/22. Unfortunately, she went back into Afib on 01/09/22. She reportedly had missed doses of her apixaban as well. She expressed interest in ablation and has been referred to EP.  Today, the patient states she has been feeling dizzy and more SOB with exertion with her Afib. No syncope. No chest pain/heaviness. Has trace pedal edema when standing outside but this is not new. No orthopnea or PND. Has been taking her medications and has been compliant with apixaban without bleeding issues. Discussed options going forward and she states she feels well enough for now  that she would like to keep medications as is until she has follow-up with Dr. Lalla Brothers  Past Medical History:  Diagnosis Date   Atrial fibrillation (HCC)    Hypertension    Pancreatitis    Shingles     Past Surgical History:  Procedure Laterality Date   CARDIOVERSION N/A 01/02/2022   Procedure: CARDIOVERSION;  Surgeon: Chrystie Nose, MD;  Location: [facility] ENDOSCOPY;  Service: Cardiovascular;  Laterality: N/A;   CATARACT EXTRACTION Bilateral 2016   CHOLECYSTECTOMY     COLONOSCOPY N/A 03/04/2018   Procedure: COLONOSCOPY;  Surgeon: Malissa Hippo, MD;  Location: [facility] ENDO SUITE;  Service: Endoscopy;  Laterality: N/A;  12:45   complete hysterecomy      Current Medications: Current Meds  Medication Sig   albuterol (VENTOLIN HFA) 108 (90 Base) MCG/ACT inhaler Inhale 2 puffs into the lungs every 6 (six) hours as needed for wheezing or shortness of breath.   CREON 36000-114000 units CPEP capsule Take 1 capsule by mouth daily.   ELIQUIS 5 MG TABS tablet Take 1 tablet (5 mg total) by mouth 2 (two) times daily.   levocetirizine (XYZAL) 5 MG tablet Take 5 mg by mouth at bedtime.   loperamide (IMODIUM) 2 MG capsule Take 1 capsule (2 mg total) by mouth every 6 (six) hours as needed for diarrhea or loose stools.   meclizine (ANTIVERT) 12.5 MG tablet Take 12.5 mg by mouth 3 (three) times daily as needed.   metoprolol tartrate (LOPRESSOR) 100 MG tablet Take 1 tablet (100 mg total)  by mouth 2 (two) times daily.   Multiple Vitamins-Minerals (HAIR SKIN AND NAILS FORMULA) TABS Take 3 tablets by mouth daily.     Allergies:   Codeine and Morphine and related   Social History   Socioeconomic History   Marital status: Divorced    Spouse name: Not on file   Number of children: Not on file   Years of education: Not on file   Highest education level: Not on file  Occupational History   Not on file  Tobacco Use   Smoking status: Former   Smokeless tobacco: Never   Tobacco comments:    Former smoker  12/25/21 smoked 48 yrs ago.   Vaping Use   Vaping Use: Never used  Substance and Sexual Activity   Alcohol use: Never   Drug use: Never   Sexual activity: Not on file  Other Topics Concern   Not on file  Social History Narrative   Not on file   Social Determinants of Health   Financial Resource Strain: Not on file  Food Insecurity: Not on file  Transportation Needs: Not on file  Physical Activity: Not on file  Stress: Not on file  Social Connections: Not on file     Family History: The patient's family history includes Cancer in her mother; Heart attack in her father. There is no history of Colon cancer.  ROS:   Please see the history of present illness.     All other systems reviewed and are negative.  EKGs/Labs/Other Studies Reviewed:    The following studies were reviewed today: TTE Nov 01, 2021: IMPRESSIONS   1. Left ventricular ejection fraction, by estimation, is 60 to 65%. The  left ventricle has normal function. The left ventricle has no regional  wall motion abnormalities. There is mild left ventricular hypertrophy of  the basal-septal segment. Left  ventricular diastolic function could not be evaluated.   2. Right ventricular systolic function is normal. The right ventricular  size is normal. There is moderately elevated pulmonary artery systolic  pressure. The estimated right ventricular systolic pressure is 50.1 mmHg.   3. The pericardial effusion is anterior to the right ventricle.   4. The mitral valve is normal in structure. Trivial mitral valve  regurgitation. No evidence of mitral stenosis.   5. The aortic valve is normal in structure. Aortic valve regurgitation is  not visualized. No aortic stenosis is present.   6. The inferior vena cava is normal in size with greater than 50%  respiratory variability, suggesting right atrial pressure of 3 mmHg.      CT abdomen/pelvis 10/21/21: IMPRESSION: 1. High suspicion for jejunal ischemia. Abnormal bowel  wall thickening of multiple loops of jejunum associated with suspected acute emboli within two proximal jejunal branches of the SMA. Both branches appear to have some contrast extending distal to the emboli (although the more distal branch is diminutive) which may indicate lack of total occlusion or collateralization. 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 The appearance raises high concern for a central source of emboli. Consider benefits of anticoagulation and search for source of systemic arterial embolic disease. There is some moderate abdominal aortic atherosclerotic calcification although relatively little calcified atherosclerosis in the SMA itself. Please note that today's exam was not a CT angiogram, but the findings above are still felt to be high confidence. 2. Small left and trace right pleural effusion with borderline cardiomegaly.   Critical Value/emergent  results were called by telephone at the time of interpretation on 10/21/2021 at 5:11 pm to provider JULIE IDOL , who verbally acknowledged these results.  EKG:  EKG not performed today  Recent Labs: 10/22/2021: TSH 6.342 11/08/2021: ALT 31; B Natriuretic Peptide 798.0; Magnesium 1.8 12/25/2021: BUN 15; Creatinine, Ser 1.00; Hemoglobin 15.0; Platelets 381; Potassium 5.0; Sodium 139  Recent Lipid Panel    Component Value Date/Time   CHOL 101 10/22/2021 0351   TRIG 83 10/22/2021 0351   HDL 30 (L) 10/22/2021 0351   CHOLHDL 3.4 10/22/2021 0351   VLDL 17 10/22/2021 0351   LDLCALC 54 10/22/2021 0351   LDLCALC 94 01/27/2018 1515     Risk Assessment/Calculations:    CHA2DS2-VASc Score = 7   This indicates a 11.2% annual risk of stroke. The patient's score is based upon: CHF History: 0 HTN History: 1 Diabetes History: 0 Stroke History: 2 (ischemic enteritis and splenic infarct (embolic)) Vascular Disease History: 1 (aortic  atherosclerosis) Age Score: 2 Gender Score: 1         Physical Exam:    VS:  BP 112/60   Pulse 64   Ht 5\' 4"  (1.626 m)   Wt 170 lb 6.4 oz (77.3 kg)   SpO2 99%   BMI 29.25 kg/m     Wt Readings from Last 3 Encounters:  02/13/22 170 lb 6.4 oz (77.3 kg)  01/15/22 167 lb (75.8 kg)  01/02/22 188 lb (85.3 kg)     GEN:  Well nourished, well developed in no acute distress HEENT: Normal NECK: No JVD; No carotid bruits CARDIAC: Irregular, no murmurs RESPIRATORY:  CTAB ABDOMEN: Soft, non-tender, non-distended MUSCULOSKELETAL:  No edema; No deformity  SKIN: Warm and dry NEUROLOGIC:  Alert and oriented x 3 PSYCHIATRIC:  Normal affect   ASSESSMENT:    No diagnosis found. PLAN:    In order of problems listed above:  #Newly Diagnosed Afib with RVR: #Secondary Hypercoagulable State: CHADs-vasc 7. TTE 10/2021 with EF 60-65%. Patient with initially diagnosed Afib with RVR during hospitalization in [place] from 10/11/21-10/16/21. She was started on apixaban and metop at that time. During admission to [facility] in 10/2021, the patient was found to have ischemic enteritis with splenic infarct thought to be due to embolic phenomena in the setting of Afib. She was maintained on AC and metop and DCCV was delayed given concern for LAA thrombus. She ultimately underwent DCCV in 12/2021 but reverted back to Afib 4 days later. Currently well rate controlled. Now planned to see EP for consideration of ablation.  -Follow-up with Dr. 01/2022 for consideration of ablation -Continue metop 100mg  BID -Continue apixaban 5mg  BID   #Ischemic Enteritis: #Splenic Infarct: Likely due to embolic phenomena from Afib as detailed above.  -Management of Afib as above  #HTN: Controlled today and at goal <130/90. -Continue metop 100mg  BID as above   #Chronic Pancreatitis: -Management per primary           Medication Adjustments/Labs and Tests Ordered: Current medicines are reviewed at length with the  patient today.  Concerns regarding medicines are outlined above.  No orders of the defined types were placed in this encounter.  No orders of the defined types were placed in this encounter.   Patient Instructions  Medication Instructions:  Your physician recommends that you continue on your current medications as directed. Please refer to the Current Medication list given to you today.  Labwork: none  Testing/Procedures: none  Follow-Up: Your physician recommends that you schedule a follow-up appointment  in: 3 months  Any Other Special Instructions Will Be Listed Below (If Applicable).  If you need a refill on your cardiac medications before your next appointment, please call your pharmacy.   Signed, Meriam Sprague, MD  02/13/2022 1:23 PM     [facility]

## 2022-02-13 ENCOUNTER — Encounter: Payer: Self-pay | Admitting: Cardiology

## 2022-02-13 ENCOUNTER — Ambulatory Visit (INDEPENDENT_AMBULATORY_CARE_PROVIDER_SITE_OTHER): Payer: Medicare Other | Admitting: Cardiology

## 2022-02-13 ENCOUNTER — Other Ambulatory Visit: Payer: Self-pay

## 2022-02-13 VITALS — BP 112/60 | HR 64 | Ht 64.0 in | Wt 170.4 lb

## 2022-02-13 DIAGNOSIS — K559 Vascular disorder of intestine, unspecified: Secondary | ICD-10-CM

## 2022-02-13 DIAGNOSIS — I4819 Other persistent atrial fibrillation: Secondary | ICD-10-CM | POA: Diagnosis not present

## 2022-02-13 DIAGNOSIS — D6869 Other thrombophilia: Secondary | ICD-10-CM | POA: Diagnosis not present

## 2022-02-13 DIAGNOSIS — I10 Essential (primary) hypertension: Secondary | ICD-10-CM | POA: Diagnosis not present

## 2022-02-13 DIAGNOSIS — I482 Chronic atrial fibrillation, unspecified: Secondary | ICD-10-CM

## 2022-02-13 MED ORDER — ELIQUIS 5 MG PO TABS: 5.0000 mg | ORAL_TABLET | Freq: Two times a day (BID) | ORAL | 5 refills | Status: DC

## 2022-02-13 NOTE — Patient Instructions (Addendum)
Medication Instructions:   Your physician recommends that you continue on your current medications as directed. Please refer to the Current Medication list given to you today.  Labwork:  none  Testing/Procedures:  none  Follow-Up:  Your physician recommends that you schedule a follow-up appointment in: 3 months.  Any Other Special Instructions Will Be Listed Below (If Applicable).  If you need a refill on your cardiac medications before your next appointment, please call your pharmacy. 

## 2022-02-13 NOTE — Telephone Encounter (Signed)
Prescription refill request for Eliquis received. Indication:Afib Last office visit:7/5 Scr:1.0 Age: 76 Weight:75.8 kg  Prescription refilled

## 2022-03-09 ENCOUNTER — Encounter (HOSPITAL_COMMUNITY): Payer: Self-pay | Admitting: Emergency Medicine

## 2022-03-09 ENCOUNTER — Other Ambulatory Visit: Payer: Self-pay

## 2022-03-09 ENCOUNTER — Emergency Department (HOSPITAL_COMMUNITY)
Admission: EM | Admit: 2022-03-09 | Discharge: 2022-03-09 | Disposition: A | Discharge status: Home / Self Care | Payer: Medicare Other | Attending: Emergency Medicine | Admitting: Emergency Medicine

## 2022-03-09 DIAGNOSIS — R21 Rash and other nonspecific skin eruption: Secondary | ICD-10-CM | POA: Insufficient documentation

## 2022-03-09 DIAGNOSIS — I4891 Unspecified atrial fibrillation: Secondary | ICD-10-CM | POA: Insufficient documentation

## 2022-03-09 DIAGNOSIS — Z7901 Long term (current) use of anticoagulants: Secondary | ICD-10-CM | POA: Diagnosis not present

## 2022-03-09 DIAGNOSIS — Z79899 Other long term (current) drug therapy: Secondary | ICD-10-CM | POA: Diagnosis not present

## 2022-03-09 DIAGNOSIS — I10 Essential (primary) hypertension: Secondary | ICD-10-CM | POA: Diagnosis not present

## 2022-03-09 MED ORDER — TRIAMCINOLONE ACETONIDE 0.1 % EX CREA: 1.0000 | TOPICAL_CREAM | Freq: Two times a day (BID) | CUTANEOUS | 0 refills | Status: DC

## 2022-03-09 MED ORDER — VALACYCLOVIR HCL 1 G PO TABS: 1000.0000 mg | ORAL_TABLET | Freq: Three times a day (TID) | ORAL | 0 refills | Status: AC

## 2022-03-09 NOTE — Discharge Instructions (Addendum)
Note the work-up today was overall reassuring.  Apply topical steroid prescribed twice a day over rash of your right leg.  You can continue the cream as long as the rash is present.  Please do not exceed 14 days of said treatment.  Continue to use your at home Xyzal as well as anti-itch cream if needed. As we discussed, second rash is concerning for shingles.  We will treat this with an oral antiviral medicine to take 3 times a day for the next 14 days.  Keep areas clean.  Follow-up with a primary care provider recommended 3 to 5 days for reevaluation of your symptoms.  Please do not hesitate to return to emergency department for worrisome signs symptoms we discussed become apparent.

## 2022-03-09 NOTE — ED Provider Notes (Signed)
[facility] EMERGENCY DEPARTMENT Provider Note   CSN: [id number] Arrival date & time: 03/09/22  1421     History  No chief complaint on file.   Shelly Maldonado is a 76 y.o. female.  HPI   76 year old female presents emergency department with complaints of rash.  She states she had a rash pop up on her right posterior leg after sitting outside.  She notes feelings of multiple insect bite then.  She has tried at home therapy with anti-itch cream with minimal to no relief of symptoms.  She states the rash has not progressed in appearance there is remained persistent prompting her visit to the emergency department.  She also states today that a burning rash appeared on her left upper leg.  Rash stated to be similar in feeling and distribution as prior shingles rash.  She denies fever, chills, night sweats, chest pain, shortness of breath, feelings of throat closing in on her, difficulty swallowing food/liquids.  She reports no recent medication changes.  Denies any new lotions, creams, detergents.  Past medical history significant for atrial fibrillation of which she is currently on Eliquis, hypertension, pancreatitis, shingles  Home Medications Prior to Admission medications   Medication Sig Start Date End Date Taking? Authorizing Provider  triamcinolone cream (KENALOG) 0.1 % Apply 1 Application topically 2 (two) times daily. 03/09/22  Yes Sherian Maroon A, PA  valACYclovir (VALTREX) 1000 MG tablet Take 1 tablet (1,000 mg total) by mouth 3 (three) times daily for 14 days. 03/09/22 03/23/22 Yes Sherian Maroon A, PA  albuterol (VENTOLIN HFA) 108 (90 Base) MCG/ACT inhaler Inhale 2 puffs into the lungs every 6 (six) hours as needed for wheezing or shortness of breath. 10/26/21   Erick Blinks, MD  CREON 73419-379024 units CPEP capsule Take 1 capsule by mouth daily. 10/01/21   [provider]  ELIQUIS 5 MG TABS tablet Take 1 tablet (5 mg total) by mouth 2 (two) times daily. 02/13/22    Meriam Sprague, MD  levocetirizine (XYZAL) 5 MG tablet Take 5 mg by mouth at bedtime. 07/09/21   [provider]  loperamide (IMODIUM) 2 MG capsule Take 1 capsule (2 mg total) by mouth every 6 (six) hours as needed for diarrhea or loose stools. 10/26/21   Erick Blinks, MD  meclizine (ANTIVERT) 12.5 MG tablet Take 12.5 mg by mouth 3 (three) times daily as needed. 01/22/22   [provider]  metoprolol tartrate (LOPRESSOR) 100 MG tablet Take 1 tablet (100 mg total) by mouth 2 (two) times daily. 11/28/21 11/23/22  Meriam Sprague, MD  Multiple Vitamins-Minerals (HAIR SKIN AND NAILS FORMULA) TABS Take 3 tablets by mouth daily.    [provider]      Allergies    Codeine and Morphine and related    Review of Systems   Review of Systems  All other systems reviewed and are negative.   Physical Exam Updated Vital Signs BP (!) 170/120 (BP Location: Right Arm)   Pulse 88   Temp 98.1 F (36.7 C) (Oral)   Resp 19   Ht 5\' 4"  (1.626 m)   Wt 77.3 kg   SpO2 96%   BMI 29.25 kg/m  Physical Exam Vitals and nursing note reviewed.  Constitutional:      General: She is not in acute distress.    Appearance: She is well-developed. She is not ill-appearing.  HENT:     Head: Normocephalic and atraumatic.  Eyes:     Conjunctiva/sclera: Conjunctivae normal.  Cardiovascular:  Rate and Rhythm: Normal rate. Rhythm irregular.     Heart sounds: No murmur heard. Pulmonary:     Effort: Pulmonary effort is normal. No respiratory distress.     Breath sounds: Normal breath sounds.  Abdominal:     Palpations: Abdomen is soft.     Tenderness: There is no abdominal tenderness.  Musculoskeletal:        General: No swelling.     Cervical back: Normal range of motion and neck supple. No rigidity or tenderness.     Right lower leg: No edema.     Left lower leg: No edema.  Skin:    General: Skin is warm and dry.     Capillary Refill: Capillary refill takes less than 2  seconds.     Comments: Vesicular rash with minimal surrounding erythema noted on patient's lateral upper left lower extremity.  Rash follows dermatomal distribution.  It spans across approximately 15 cm.  Patient states area is burning.  No active drainage from site.  9-11 independent vesicular areas with minimal surrounding erythema noted on patient's posterior right leg where insect was stated to bite.  No extending erythema.  No palpable area of induration/fluctuance.  No active drainage from site noted.    Neurological:     Mental Status: She is alert.  Psychiatric:        Mood and Affect: Mood normal.     ED Results / Procedures / Treatments   Labs (all labs ordered are listed, but only abnormal results are displayed) Labs Reviewed - No data to display  EKG None  Radiology No results found.  Procedures Procedures    Medications Ordered in ED Medications - No data to display  ED Course/ Medical Decision Making/ A&P Clinical Course as of 03/09/22 1605  Sun Mar 09, 2022  1533 Temp: 98.1 F (36.7 C) [CR]    Clinical Course User Index [CR] Wilnette Kales, [place]                           Medical Decision Making  This patient presents to the ED for concern of rash, this involves an extensive number of treatment options, and is a complaint that carries with it a high risk of complications and morbidity.  The differential diagnosis includes SJS/TEN, localized drug reaction, atopic dermatitis, eczema, allergic dermatitis, varicella   Co morbidities that complicate the patient evaluation  See HPI   Additional history obtained:  Additional history obtained from EMR  Lab Tests:  N/a   Imaging Studies ordered:  N/a   Cardiac Monitoring: / EKG:  The patient was maintained on a cardiac monitor.  I personally viewed and interpreted the cardiac monitored which showed an underlying rhythm of: Irregularly rhythm with normal rate   Consultations  Obtained:  N/a   Problem List / ED Course / Critical interventions / Medication management  Rash Reevaluation of the patient showed that the patient stayed the same I have reviewed the patients home medicines and have made adjustments as needed   Social Determinants of Health:  Former smoker approximately 40 years ago.  Denies tobacco or illicit drug use currently   Test / Admission - Considered:  Vitals signs significant for hypertension with a blood pressure 170/120.  Patient also taking her antihypertensive medications today and she is not currently complaining of symptoms concerning for hypertensive emergency.. Otherwise within normal range and stable throughout visit. Patient seems to have 2 different appearing rashes.  1 concerning for herpes zoster and the other concerning for bug bite/allergic contact dermatitis.  Given failed symptomatic therapy and resolution of second rash, topical steroids will be prescribed.  Patient recommended to continue Xyzal at home as well as antiitch cream as needed.  Second rash concerning for herpes zoster will be treated with Valtrex and pain control with Tylenol/ibuprofen.  A treatment plan was discussed at length with the patient and she knowledge understanding was agreeable to said plan.  Close follow-up with primary care recommended in 2 to 3 days for reevaluation of symptoms. Worrisome signs and symptoms were discussed with the patient, and the patient acknowledged understanding to return to the ED if noticed. Patient was stable upon discharge.          Final Clinical Impression(s) / ED Diagnoses Final diagnoses:  Rash    Rx / DC Orders ED Discharge Orders          Ordered    triamcinolone cream (KENALOG) 0.1 %  2 times daily        03/09/22 1605    valACYclovir (VALTREX) 1000 MG tablet  3 times daily        03/09/22 1605              Peter Garter, [place] 03/09/22 1605    Pricilla Loveless, MD 03/09/22 1655

## 2022-03-09 NOTE — ED Triage Notes (Signed)
Pt to the ED with a rash that she states is shingles.

## 2022-03-12 ENCOUNTER — Encounter: Payer: Self-pay | Admitting: Cardiology

## 2022-03-12 ENCOUNTER — Ambulatory Visit: Payer: Medicare Other | Attending: Cardiology | Admitting: Cardiology

## 2022-03-12 VITALS — BP 124/76 | HR 94 | Ht 64.0 in | Wt 169.8 lb

## 2022-03-12 DIAGNOSIS — I4819 Other persistent atrial fibrillation: Secondary | ICD-10-CM

## 2022-03-12 DIAGNOSIS — N1831 Chronic kidney disease, stage 3a: Secondary | ICD-10-CM | POA: Diagnosis not present

## 2022-03-12 DIAGNOSIS — I159 Secondary hypertension, unspecified: Secondary | ICD-10-CM | POA: Diagnosis not present

## 2022-03-12 NOTE — Progress Notes (Deleted)
Electrophysiology Office Note:    Date:  03/12/2022   ID:  Shelly Maldonado, DOB 1946-04-17, MRN [id number]  PCP:  Toma Deiters, MD  [facility] Cardiologist:  Meriam Sprague, MD  [facility] Electrophysiologist:  None   Referring MD: Danice Goltz, PA   Chief Complaint: Atrial fibrillation  History of Present Illness:    Shelly Maldonado is a 76 y.o. female who presents for an evaluation of atrial fibrillation at the request of Shelly Maldonado. Their medical history includes hypertension, aortic atherosclerosis, chronic pancreatitis, ischemic enteritis and atrial fibrillation.  The patient saw Clide Cliff in the A-fib clinic on January 15, 2022.  Her A-fib was diagnosed in March 2023 during the hospitalization in [place].  She was started on Eliquis.  She was subsequently admitted to [facility] October 21, 2021 with ischemic enteritis with a splenic infarct.  The patient had a cardioversion on January 02, 2022.  She went back out of rhythm January 09, 2022.  She is symptomatic when in atrial fibrillation.  She experiences lightheadedness and fatigue.  At the appointment with [facility], rhythm control options were discussed and the patient elected to pursue catheter ablation.     Past Medical History:  Diagnosis Date   Atrial fibrillation (HCC)    Hypertension    Pancreatitis    Shingles     Past Surgical History:  Procedure Laterality Date   CARDIOVERSION N/A 01/02/2022   Procedure: CARDIOVERSION;  Surgeon: Chrystie Nose, MD;  Location: [facility] ENDOSCOPY;  Service: Cardiovascular;  Laterality: N/A;   CATARACT EXTRACTION Bilateral 2016   CHOLECYSTECTOMY     COLONOSCOPY N/A 03/04/2018   Procedure: COLONOSCOPY;  Surgeon: Malissa Hippo, MD;  [facility] ENDO [facility];  Service: Endoscopy;  Laterality: N/A;  12:45   complete hysterecomy      Current Medications: No outpatient medications have been marked as taking for the 03/12/22 encounter (Appointment) with Lanier Prude, MD.      Allergies:   Codeine and Morphine and related   Social History   Socioeconomic History   Marital status: Divorced    Spouse name: Not on file   Number of children: Not on file   Years of education: Not on file   Highest education level: Not on file  Occupational History   Not on file  Tobacco Use   Smoking status: Former   Smokeless tobacco: Never   Tobacco comments:    Former smoker 12/25/21 smoked 48 yrs ago.   Vaping Use   Vaping Use: Never used  Substance and Sexual Activity   Alcohol use: Never   Drug use: Never   Sexual activity: Not on file  Other Topics Concern   Not on file  Social History Narrative   Not on file   Social Determinants of Health   Financial Resource Strain: Not on file  Food Insecurity: Not on file  Transportation Needs: Not on file  Physical Activity: Not on file  Stress: Not on file  Social Connections: Not on file     Family History: The patient's family history includes Cancer in her mother; Heart attack in her father. There is no history of Colon cancer.  ROS:   Please see the history of present illness.    All other systems reviewed and are negative.  EKGs/Labs/Other Studies Reviewed:    The following studies were reviewed today:  October 22, 2021 echo EF 60 to 65% RV function normal Trivial MR Trivial pericardial effusion  Recent Labs: 10/22/2021: TSH 6.342 11/08/2021: ALT 31; B Natriuretic Peptide 798.0; Magnesium 1.8 12/25/2021: BUN 15; Creatinine, Ser 1.00; Hemoglobin 15.0; Platelets 381; Potassium 5.0; Sodium 139  Recent Lipid Panel    Component Value Date/Time   CHOL 101 10/22/2021 0351   TRIG 83 10/22/2021 0351   HDL 30 (L) 10/22/2021 0351   CHOLHDL 3.4 10/22/2021 0351   VLDL 17 10/22/2021 0351   LDLCALC 54 10/22/2021 0351   LDLCALC 94 01/27/2018 1515    Physical Exam:    VS:  There were no vitals taken for this visit.    Wt Readings from Last 3 Encounters:  03/09/22 170 lb 6.7 oz (77.3 kg)   02/13/22 170 lb 6.4 oz (77.3 kg)  01/15/22 167 lb (75.8 kg)     GEN: *** Well nourished, well developed in no acute distress HEENT: Normal NECK: No JVD; No carotid bruits LYMPHATICS: No lymphadenopathy CARDIAC: ***RRR, no murmurs, rubs, gallops RESPIRATORY:  Clear to auscultation without rales, wheezing or rhonchi  ABDOMEN: Soft, non-tender, non-distended MUSCULOSKELETAL:  No edema; No deformity  SKIN: Warm and dry NEUROLOGIC:  Alert and oriented x 3 PSYCHIATRIC:  Normal affect       ASSESSMENT:    No diagnosis found. PLAN:    In order of problems listed above:   #Persistent atrial fibrillation Patient has symptomatic atrial fibrillation despite cardioversions.  Rhythm control indicated.  Discussed rhythm control options including catheter ablation and antiarrhythmic drug therapy and she wishes to proceed with catheter ablation.  Discussed treatment options today for his AF including antiarrhythmic drug therapy and ablation. Discussed risks, recovery and likelihood of success. Discussed potential need for repeat ablation procedures and antiarrhythmic drugs after an initial ablation. They wish to proceed with scheduling.  Risk, benefits, and alternatives to EP study and radiofrequency ablation for afib were also discussed in detail today. These risks include but are not limited to stroke, bleeding, vascular damage, tamponade, perforation, damage to the esophagus, lungs, and other structures, pulmonary vein stenosis, worsening renal function, and death. The patient understands these risk and wishes to proceed.  We will therefore proceed with catheter ablation at the next available time.  Carto, ICE, anesthesia are requested for the procedure.  Will also obtain CT PV protocol prior to the procedure to exclude LAA thrombus and further evaluate atrial anatomy.        Total time spent with patient today *** minutes. This includes reviewing records, evaluating the patient and  coordinating care.  Medication Adjustments/Labs and Tests Ordered: Current medicines are reviewed at length with the patient today.  Concerns regarding medicines are outlined above.  No orders of the defined types were placed in this encounter.  No orders of the defined types were placed in this encounter.    Signed, Rossie Muskrat. Lalla Brothers, MD, [facility], [facility] 03/12/2022 6:07 AM    Electrophysiology [facility]

## 2022-03-12 NOTE — Patient Instructions (Signed)
Medication Instructions:  none *If you need a refill on your cardiac medications before your next appointment, please call your pharmacy*   Lab Work: Nov 3  If you have labs (blood work) drawn today and your tests are completely normal, you will receive your results only by: MyChart Message (if you have MyChart) OR A paper copy in the mail If you have any lab test that is abnormal or we need to change your treatment, we will call you to review the results.   Testing/Procedures: Your physician has requested that you have cardiac CT. Cardiac computed tomography (CT) is a painless test that uses an x-ray machine to take clear, detailed pictures of your heart. For further information please visit [email or web address] Please follow instruction sheet as given.  Your physician has recommended that you have an ablation. Catheter ablation is a medical procedure used to treat some cardiac arrhythmias (irregular heartbeats). During catheter ablation, a long, thin, flexible tube is put into a blood vessel in your groin (upper thigh), or neck. This tube is called an ablation catheter. It is then guided to your heart through the blood vessel. Radio frequency waves destroy small areas of heart tissue where abnormal heartbeats may cause an arrhythmia to start. Please see the instruction sheet given to you today.    Follow-Up: At [facility], you and your health needs are our priority.  As part of our continuing mission to provide you with exceptional heart care, we have created designated Provider Care Teams.  These Care Teams include your primary Cardiologist (physician) and Advanced Practice Providers (APPs -  Physician Assistants and Nurse Practitioners) who all work together to provide you with the care you need, when you need it.  We recommend signing up for the patient portal called "MyChart".  Sign up information is provided on this After Visit Summary.  MyChart is used to connect with  patients for Virtual Visits (Telemedicine).  Patients are able to view lab/test results, encounter notes, upcoming appointments, etc.  Non-urgent messages can be sent to your provider as well.   To learn more about what you can do with MyChart, go to [email or web address].    Your next appointment:   Ablation date picked is Nov 24 and pre op labs Nov 3. Inetta Fermo RN will meet with you after your labs work for CT and Ablation instructions.     Important Information About Sugar

## 2022-03-12 NOTE — Progress Notes (Signed)
Electrophysiology Office Note:    Date:  03/12/2022   ID:  Shelly Maldonado, DOB 25-Dec-1945, MRN [id number]  PCP:  Neale Burly, MD  [facility] Cardiologist:  Freada Bergeron, MD  [facility] Electrophysiologist:  Vickie Epley, MD   Referring MD: Oliver Barre, PA   Chief Complaint: Atrial fibrillation  History of Present Illness:    Shelly Maldonado is a 76 y.o. female who presents for an evaluation of atrial fibrillation at the request of Shelly Maldonado. Their medical history includes hypertension, aortic atherosclerosis, chronic pancreatitis, ischemic enteritis and atrial fibrillation.  The patient saw Shelly Maldonado in the A-fib clinic on January 15, 2022.  Her A-fib was diagnosed in March 2023 during the hospitalization in [place].  She was started on Eliquis.  She was subsequently admitted to [facility] October 21, 2021 with ischemic enteritis with a splenic infarct.  The patient had a cardioversion on January 02, 2022.  She went back out of rhythm January 09, 2022.  She is symptomatic when in atrial fibrillation.  She experiences lightheadedness and fatigue.  At the appointment with [facility], rhythm control options were discussed and the patient elected to pursue catheter ablation.  Today:  During afib episodes, she feels dizziness, lightheadedness, and shortness of breath. When she had pneumonia, her afib episodes were more frequent and severe.   She is compliant with Eliquis and Metoprolol. She has no side effects from the medications.  She had a cardioversion, and she felt much better afterwards. However, it didn't last.  She was told by a hospital she visited than she no longer has pancreatitis. While she once had chronic diarrhea, she now only has occasional diarrhea.   Catheter Ablation, Tikosyn, and Amiodarone, were discussed as options for the patient for her Afib.  She has shingles.  She denies any chest pain or peripheral edema. No headaches, syncope, orthopnea, or  PND.    Past Medical History:  Diagnosis Date   Atrial fibrillation ([place])    Hypertension    Pancreatitis    Shingles     Past Surgical History:  Procedure Laterality Date   CARDIOVERSION N/A 01/02/2022   Procedure: CARDIOVERSION;  Surgeon: Pixie Casino, MD;  Location: [facility] ENDOSCOPY;  Service: Cardiovascular;  Laterality: N/A;   CATARACT EXTRACTION Bilateral 2016   CHOLECYSTECTOMY     COLONOSCOPY N/A 03/04/2018   Procedure: COLONOSCOPY;  Surgeon: Rogene Houston, MD;  [facility] ENDO [facility];  Service: Endoscopy;  Laterality: N/A;  12:45   complete hysterecomy      Current Medications: Current Meds  Medication Sig   CREON 36000-114000 units CPEP capsule Take 1 capsule by mouth daily.   ELIQUIS 5 MG TABS tablet Take 1 tablet (5 mg total) by mouth 2 (two) times daily.   levocetirizine (XYZAL) 5 MG tablet Take 5 mg by mouth at bedtime.   loperamide (IMODIUM) 2 MG capsule Take 1 capsule (2 mg total) by mouth every 6 (six) hours as needed for diarrhea or loose stools.   meclizine (ANTIVERT) 12.5 MG tablet Take 12.5 mg by mouth 3 (three) times daily as needed.   metoprolol tartrate (LOPRESSOR) 100 MG tablet Take 1 tablet (100 mg total) by mouth 2 (two) times daily.   Multiple Vitamins-Minerals (HAIR SKIN AND NAILS FORMULA) TABS Take 3 tablets by mouth daily.   triamcinolone cream (KENALOG) 0.1 % Apply 1 Application topically 2 (two) times daily.   valACYclovir (VALTREX) 1000 MG tablet Take 1 tablet (1,000 mg total) by mouth  3 (three) times daily for 14 days.     Allergies:   Codeine and Morphine and related   Social History   Socioeconomic History   Marital status: Divorced    Spouse name: Not on file   Number of children: Not on file   Years of education: Not on file   Highest education level: Not on file  Occupational History   Not on file  Tobacco Use   Smoking status: Former   Smokeless tobacco: Never   Tobacco comments:    Former smoker 12/25/21 smoked 48 yrs ago.    Vaping Use   Vaping Use: Never used  Substance and Sexual Activity   Alcohol use: Never   Drug use: Never   Sexual activity: Not on file  Other Topics Concern   Not on file  Social History Narrative   Not on file   Social Determinants of Health   Financial Resource Strain: Not on file  Food Insecurity: Not on file  Transportation Needs: Not on file  Physical Activity: Not on file  Stress: Not on file  Social Connections: Not on file     Family History: The patient's family history includes Cancer in her mother; Heart attack in her father. There is no history of Colon cancer.  ROS:   Please see the history of present illness.     (+)Dizziness (+)Lightheadedness (+)Shortness of Breath (+)Diarrhea (+)Shingles rash  All other systems reviewed and are negative.  EKGs/Labs/Other Studies Reviewed:    The following studies were reviewed today:  October 22, 2021 echo EF 60 to 65% RV function normal Trivial MR Trivial pericardial effusion  EKG: EKG is personally reviewed  March 12, 2022: No EKG ordered.   Recent Labs: 10/22/2021: TSH 6.342 11/08/2021: ALT 31; B Natriuretic Peptide 798.0; Magnesium 1.8 12/25/2021: BUN 15; Creatinine, Ser 1.00; Hemoglobin 15.0; Platelets 381; Potassium 5.0; Sodium 139  Recent Lipid Panel    Component Value Date/Time   CHOL 101 10/22/2021 0351   TRIG 83 10/22/2021 0351   HDL 30 (L) 10/22/2021 0351   CHOLHDL 3.4 10/22/2021 0351   VLDL 17 10/22/2021 0351   LDLCALC 54 10/22/2021 0351   LDLCALC 94 01/27/2018 1515    Physical Exam:    VS:  BP 124/76   Pulse 94   Ht 5\' 4"  (1.626 m)   Wt 169 lb 12.8 oz (77 kg)   SpO2 96%   BMI 29.15 kg/m     Wt Readings from Last 3 Encounters:  03/12/22 169 lb 12.8 oz (77 kg)  03/09/22 170 lb 6.7 oz (77.3 kg)  02/13/22 170 lb 6.4 oz (77.3 kg)     GEN:  Well nourished, well developed in no acute distress HEENT: Normal NECK: No JVD; No carotid bruits LYMPHATICS: No  lymphadenopathy CARDIAC: Irregularly irregular, no murmurs, rubs, gallops RESPIRATORY:  Clear to auscultation without rales, wheezing or rhonchi  ABDOMEN: Soft, non-tender, non-distended MUSCULOSKELETAL:  No edema; No deformity  SKIN: Warm and dry NEUROLOGIC:  Alert and oriented x 3 PSYCHIATRIC:  Normal affect       ASSESSMENT:    1. Persistent atrial fibrillation (HCC)   2. Secondary hypertension   3. Stage 3a chronic kidney disease (CKD) (HCC)    PLAN:    In order of problems listed above:   #Persistent atrial fibrillation Patient has symptomatic atrial fibrillation despite cardioversions.  Rhythm control indicated.  Discussed rhythm control options including catheter ablation and antiarrhythmic drug therapy and she wishes to proceed with catheter  ablation.  Discussed treatment options today for his AF including antiarrhythmic drug therapy and ablation. Discussed risks, recovery and likelihood of success. Discussed potential need for repeat ablation procedures and antiarrhythmic drugs after an initial ablation. They wish to proceed with scheduling.  Risk, benefits, and alternatives to EP study and radiofrequency ablation for afib were also discussed in detail today. These risks include but are not limited to stroke, bleeding, vascular damage, tamponade, perforation, damage to the esophagus, lungs, and other structures, pulmonary vein stenosis, worsening renal function, and death. The patient understands these risk and wishes to proceed.  We will therefore proceed with catheter ablation at the next available time.  Carto, ICE, anesthesia are requested for the procedure.  Will also obtain CT PV protocol prior to the procedure to exclude LAA thrombus and further evaluate atrial anatomy.   Medication Adjustments/Labs and Tests Ordered: Current medicines are reviewed at length with the patient today.  Concerns regarding medicines are outlined above.  Orders Placed This Encounter   Procedures   CT CARDIAC MORPH/PULM VEIN W/CM&W/O CA SCORE   CBC w/Diff   Basic Metabolic Panel (BMET)   No orders of the defined types were placed in this encounter.   I,Mary Shauna Hugh Buren,acting as a scribe for Lanier Prude, MD.,have documented all relevant documentation on the behalf of Lanier Prude, MD,as directed by  Lanier Prude, MD while in the presence of Lanier Prude, MD.   I, Lanier Prude, MD, have reviewed all documentation for this visit. The documentation on 03/12/22 for the exam, diagnosis, procedures, and orders are all accurate and complete.   Signed, Rossie Muskrat. Lalla Brothers, MD, [facility], [facility] 03/12/2022 8:09 PM    Electrophysiology [facility]

## 2022-03-18 DIAGNOSIS — R300 Dysuria: Secondary | ICD-10-CM | POA: Diagnosis not present

## 2022-03-18 DIAGNOSIS — R350 Frequency of micturition: Secondary | ICD-10-CM | POA: Diagnosis not present

## 2022-04-12 NOTE — Progress Notes (Signed)
Primary Care Physician:  Toma Deiters, MD Primary Gastroenterologist:  Dr. Marletta Lor  Chief Complaint  Patient presents with   Diarrhea    HPI:   Shelly Maldonado is a 76 y.o. female who presents to clinic today by referral from her PCP Dr. Olena Leatherwood for evaluation.  Recent hospitalization October 21, 2021 at [facility] after initially presenting with ongoing diarrhea.  In the ER, CT abdomen pelvis which I personally reviewed showed thromboemboli of the SMA, splenic infarct, concerns for ischemic enteritis of the jejunum.  Treated conservatively and improved.  Surgery was consulted who recommended conservative management.  Notes history of chronic pancreatitis for 3 to 4 years.  Notes ongoing diarrhea.  Was started on Creon.  Abdominal pain improved.  Treated for pneumonia as well and currently taking antibiotics.  Today she does states she is improved, continues to have diarrhea that is slowed.  Past Medical History:  Diagnosis Date   Atrial fibrillation (HCC)    Hypertension    Pancreatitis    Shingles     Past Surgical History:  Procedure Laterality Date   CARDIOVERSION N/A 01/02/2022   Procedure: CARDIOVERSION;  Surgeon: Chrystie Nose, MD;  Location: [facility] ENDOSCOPY;  Service: Cardiovascular;  Laterality: N/A;   CATARACT EXTRACTION Bilateral 2016   CHOLECYSTECTOMY     COLONOSCOPY N/A 03/04/2018   Procedure: COLONOSCOPY;  Surgeon: Malissa Hippo, MD;  [facility]O [facility];  Service: Endoscopy;  Laterality: N/A;  12:45   complete hysterecomy      Current Outpatient Medications  Medication Sig Dispense Refill   CREON 36000-114000 units CPEP capsule Take 1 capsule by mouth daily.     levocetirizine (XYZAL) 5 MG tablet Take 5 mg by mouth at bedtime.     loperamide (IMODIUM) 2 MG capsule Take 1 capsule (2 mg total) by mouth every 6 (six) hours as needed for diarrhea or loose stools. 30 capsule 0   albuterol (VENTOLIN HFA) 108 (90 Base) MCG/ACT inhaler Inhale 2  puffs into the lungs every 6 (six) hours as needed for wheezing or shortness of breath. (Patient not taking: Reported on 03/12/2022) 8 g 2   ELIQUIS 5 MG TABS tablet Take 1 tablet (5 mg total) by mouth 2 (two) times daily. 60 tablet 5   meclizine (ANTIVERT) 12.5 MG tablet Take 12.5 mg by mouth 3 (three) times daily as needed.     metoprolol tartrate (LOPRESSOR) 100 MG tablet Take 1 tablet (100 mg total) by mouth 2 (two) times daily. 180 tablet 3   Multiple Vitamins-Minerals (HAIR SKIN AND NAILS FORMULA) TABS Take 3 tablets by mouth daily.     triamcinolone cream (KENALOG) 0.1 % Apply 1 Application topically 2 (two) times daily. 30 g 0   No current facility-administered medications for this visit.    Allergies as of 10/30/2021 - Review Complete 10/30/2021  Allergen Reaction Noted   Codeine Other (See Comments) 01/27/2018   Morphine and related  01/27/2018    Family History  Problem Relation Age of Onset   Cancer Mother    Heart attack Father    Colon cancer Neg Hx     Social History   Socioeconomic History   Marital status: Divorced    Spouse name: Not on file   Number of children: Not on file   Years of education: Not on file   Highest education level: Not on file  Occupational History   Not on file  Tobacco Use   Smoking status: Former  Smokeless tobacco: Never   Tobacco comments:    Former smoker 12/25/21 smoked 48 yrs ago.   Vaping Use   Vaping Use: Never used  Substance and Sexual Activity   Alcohol use: Never   Drug use: Never   Sexual activity: Not on file  Other Topics Concern   Not on file  Social History Narrative   Not on file   Social Determinants of Health   Financial Resource Strain: Not on file  Food Insecurity: Not on file  Transportation Needs: Not on file  Physical Activity: Not on file  Stress: Not on file  Social Connections: Not on file  Intimate Partner Violence: Not on file    Subjective: Review of Systems  Constitutional:  Negative  for chills and fever.  HENT:  Negative for congestion and hearing loss.   Eyes:  Negative for blurred vision and double vision.  Respiratory:  Negative for cough and shortness of breath.   Cardiovascular:  Negative for chest pain and palpitations.  Gastrointestinal:  Positive for diarrhea. Negative for abdominal pain, blood in stool, constipation, heartburn, melena and vomiting.  Genitourinary:  Negative for dysuria and urgency.  Musculoskeletal:  Negative for joint pain and myalgias.  Skin:  Negative for itching and rash.  Neurological:  Negative for dizziness and headaches.  Psychiatric/Behavioral:  Negative for depression. The patient is not nervous/anxious.        Objective: BP 128/64   Pulse 80   Temp (!) 97.4 F (36.3 C) (Temporal)   Ht 5\' 4"  (1.626 m)   Wt 177 lb 9.6 oz (80.6 kg)   BMI 30.48 kg/m  Physical Exam Constitutional:      Appearance: Normal appearance.  HENT:     Head: Normocephalic and atraumatic.  Eyes:     Extraocular Movements: Extraocular movements intact.     Conjunctiva/sclera: Conjunctivae normal.  Cardiovascular:     Rate and Rhythm: Normal rate and regular rhythm.  Pulmonary:     Effort: Pulmonary effort is normal.     Breath sounds: Normal breath sounds.  Abdominal:     General: Bowel sounds are normal.     Palpations: Abdomen is soft.  Musculoskeletal:        General: No swelling. Normal range of motion.     Cervical back: Normal range of motion and neck supple.  Skin:    General: Skin is warm and dry.     Coloration: Skin is not jaundiced.  Neurological:     General: No focal deficit present.     Mental Status: She is alert and oriented to person, place, and time.  Psychiatric:        Mood and Affect: Mood normal.        Behavior: Behavior normal.      Assessment: *Chronic pancreatitis *Chronic diarrhea due to above *Thromboembolism with SMA/jejunal enteritis *Splenic infarct  Plan: In regards to patient's chronic  pancreatitis and diarrhea, increase Creon to 2 capsules with first bite of each meal and 1 capsule with any snacks.  Low FODMAP diet.  Finish course of antibiotics for pneumonia.  I will refer to hematology in regards to thromboembolism/splenic infarct.  Continue on Eliquis.  Follow-up in 6 to 8 weeks.  Thank you Dr. Sherrie Sport for the kind referral.   04/12/2022 11:36 AM   Disclaimer: This note was dictated with voice recognition software. Similar sounding words can inadvertently be transcribed and may not be corrected upon review.

## 2022-04-17 DIAGNOSIS — Z23 Encounter for immunization: Secondary | ICD-10-CM | POA: Diagnosis not present

## 2022-04-29 ENCOUNTER — Inpatient Hospital Stay: Payer: Medicare Other | Attending: Hematology

## 2022-04-29 DIAGNOSIS — I10 Essential (primary) hypertension: Secondary | ICD-10-CM | POA: Diagnosis not present

## 2022-04-29 DIAGNOSIS — I4891 Unspecified atrial fibrillation: Secondary | ICD-10-CM | POA: Insufficient documentation

## 2022-04-29 DIAGNOSIS — K859 Acute pancreatitis without necrosis or infection, unspecified: Secondary | ICD-10-CM | POA: Diagnosis not present

## 2022-04-29 DIAGNOSIS — D735 Infarction of spleen: Secondary | ICD-10-CM

## 2022-04-29 DIAGNOSIS — Z7901 Long term (current) use of anticoagulants: Secondary | ICD-10-CM | POA: Diagnosis not present

## 2022-04-29 DIAGNOSIS — Z800 Family history of malignant neoplasm of digestive organs: Secondary | ICD-10-CM | POA: Diagnosis not present

## 2022-04-29 DIAGNOSIS — N1831 Chronic kidney disease, stage 3a: Secondary | ICD-10-CM

## 2022-04-29 DIAGNOSIS — I7589 Atheroembolism of other site: Secondary | ICD-10-CM | POA: Diagnosis not present

## 2022-04-29 DIAGNOSIS — Z87891 Personal history of nicotine dependence: Secondary | ICD-10-CM | POA: Diagnosis not present

## 2022-04-29 DIAGNOSIS — Z79899 Other long term (current) drug therapy: Secondary | ICD-10-CM | POA: Insufficient documentation

## 2022-04-29 DIAGNOSIS — I4819 Other persistent atrial fibrillation: Secondary | ICD-10-CM

## 2022-04-29 DIAGNOSIS — I159 Secondary hypertension, unspecified: Secondary | ICD-10-CM

## 2022-04-29 LAB — COMPREHENSIVE METABOLIC PANEL
ALT: 21 U/L (ref 0–44)
AST: 24 U/L (ref 15–41)
Albumin: 3.8 g/dL (ref 3.5–5.0)
Alkaline Phosphatase: 71 U/L (ref 38–126)
Anion gap: 9 (ref 5–15)
BUN: 22 mg/dL (ref 8–23)
CO2: 24 mmol/L (ref 22–32)
Calcium: 9.2 mg/dL (ref 8.9–10.3)
Chloride: 107 mmol/L (ref 98–111)
Creatinine, Ser: 1.1 mg/dL — ABNORMAL HIGH (ref 0.44–1.00)
GFR, Estimated: 52 mL/min — ABNORMAL LOW (ref >60)
Glucose, Bld: 92 mg/dL (ref 70–99)
Potassium: 4.1 mmol/L (ref 3.5–5.1)
Sodium: 140 mmol/L (ref 135–145)
Total Bilirubin: 0.7 mg/dL (ref 0.3–1.2)
Total Protein: 7.2 g/dL (ref 6.5–8.1)

## 2022-04-29 LAB — D-DIMER, QUANTITATIVE: D-Dimer, Quant: <0.27 ug/mL-FEU (ref 0.00–0.50)

## 2022-05-01 LAB — BETA-2-GLYCOPROTEIN I ABS, IGG/M/A
Beta-2 Glyco I IgG: <9 GPI IgG units (ref 0–20)
Beta-2-Glycoprotein I IgA: <9 GPI IgA units (ref 0–25)
Beta-2-Glycoprotein I IgM: <9 GPI IgM units (ref 0–32)

## 2022-05-06 ENCOUNTER — Inpatient Hospital Stay (HOSPITAL_BASED_OUTPATIENT_CLINIC_OR_DEPARTMENT_OTHER): Payer: Medicare Other | Admitting: Hematology

## 2022-05-06 VITALS — BP 175/94 | HR 72 | Temp 97.9°F | Resp 18 | Ht 64.5 in | Wt 169.2 lb

## 2022-05-06 DIAGNOSIS — I10 Essential (primary) hypertension: Secondary | ICD-10-CM | POA: Diagnosis not present

## 2022-05-06 DIAGNOSIS — K859 Acute pancreatitis without necrosis or infection, unspecified: Secondary | ICD-10-CM | POA: Diagnosis not present

## 2022-05-06 DIAGNOSIS — D735 Infarction of spleen: Secondary | ICD-10-CM

## 2022-05-06 DIAGNOSIS — I4891 Unspecified atrial fibrillation: Secondary | ICD-10-CM | POA: Diagnosis not present

## 2022-05-06 DIAGNOSIS — Z7901 Long term (current) use of anticoagulants: Secondary | ICD-10-CM | POA: Diagnosis not present

## 2022-05-06 DIAGNOSIS — I7589 Atheroembolism of other site: Secondary | ICD-10-CM | POA: Diagnosis not present

## 2022-05-06 DIAGNOSIS — Z87891 Personal history of nicotine dependence: Secondary | ICD-10-CM | POA: Diagnosis not present

## 2022-05-06 NOTE — Patient Instructions (Addendum)
[place] [place]  Discharge Instructions  You were seen and examined today by Dr. Delton Coombes.  Dr. Delton Coombes discussed your most recent lab work which revealed that you do not have lupus.   Continue taking Eliquis as prescribed.  Follow-up as scheduled.    Thank you for choosing [facility] to provide your oncology and hematology care.   To afford each patient quality time with our provider, please arrive at least 15 minutes before your scheduled appointment time. You may need to reschedule your appointment if you arrive late (10 or more minutes). Arriving late affects you and other patients whose appointments are after yours.  Also, if you miss three or more appointments without notifying the office, you may be dismissed from the clinic at the provider's discretion.    Again, thank you for choosing [facility].  Our hope is that these requests will decrease the amount of time that you wait before being seen by our physicians.   If you have a lab appointment with the [facility] please come in thru the Main Entrance and check in at the main information desk.           _____________________________________________________________  Should you have questions after your visit to [facility], please contact our office at [phone] and follow the prompts.  Our office hours are 8:00 a.m. to 4:30 p.m. Monday - Thursday and 8:00 a.m. to 2:30 p.m. Friday.  Please note that voicemails left after 4:00 p.m. may not be returned until the following business day.  We are closed weekends and all major holidays.  You do have access to a nurse 24-7, just call the main number to the clinic [phone] and do not press any options, hold on the line and a nurse will answer the phone.    For prescription refill requests, have your pharmacy contact our office and allow 72 hours.    Masks are optional in the cancer centers. If  you would like for your care team to wear a mask while they are taking care of you, please let them know. You may have one support person who is at least 75 years old accompany you for your appointments.

## 2022-05-06 NOTE — Progress Notes (Signed)
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    CANCER STAGING: Cancer Staging  No matching staging information was found for the patient.   INTERVAL HISTORY:  Shelly Maldonado 76 y.o. 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  She is tolerating Eliquis very well.  Denies any bleeding issues.  No abdominal pain was reported.  Energy levels are 40%.    REVIEW OF SYSTEMS:  Review of Systems  Respiratory:  Positive for shortness of breath.   Gastrointestinal:  Positive for diarrhea (At times).  Neurological:  Positive for dizziness.  All other systems reviewed and are negative.    PAST MEDICAL/SURGICAL HISTORY:  Past Medical History:  Diagnosis Date   Atrial fibrillation ([place])    Hypertension    Pancreatitis    Shingles    Past Surgical History:  Procedure Laterality Date   CARDIOVERSION N/A 01/02/2022   Procedure: CARDIOVERSION;  Surgeon: Pixie Casino, MD;  Location: [facility] ENDOSCOPY;  Service: Cardiovascular;  Laterality: N/A;   CATARACT EXTRACTION Bilateral 2016   CHOLECYSTECTOMY     COLONOSCOPY N/A 03/04/2018   Procedure: COLONOSCOPY;  Surgeon: Rogene Houston, MD;  Location: [facility] ENDO SUITE;  Service: Endoscopy;  Laterality: N/A;  12:45   complete hysterecomy       SOCIAL HISTORY:  Social History   Socioeconomic History   Marital status: Divorced    Spouse name: Not on file   Number of children: Not on file   Years of education: Not on file   Highest education level: Not on file  Occupational History   Not on file  Tobacco Use   Smoking status: Former   Smokeless tobacco: Never   Tobacco comments:    Former smoker  12/25/21 smoked 48 yrs ago.   Vaping Use   Vaping Use: Never used  Substance and Sexual Activity   Alcohol use: Never   Drug use: Never   Sexual activity: Not on file  Other Topics Concern   Not on file  Social History Narrative   Not on file   Social Determinants of Health   Financial Resource Strain: Not on file  Food Insecurity: Not on file  Transportation Needs: Not on file  Physical Activity: Not on file  Stress: Not on file  Social Connections: Not on file  Intimate Partner Violence: Not on file    FAMILY HISTORY:  Family History  Problem Relation Age of Onset   Cancer Mother    Heart attack Father    Colon cancer Neg Hx     CURRENT MEDICATIONS:  Outpatient Encounter Medications as of 05/06/2022  Medication Sig   ELIQUIS 5 MG TABS tablet Take 1 tablet (5 mg total) by mouth 2 (two) times daily.   levocetirizine (XYZAL) 5 MG tablet Take 5 mg by mouth at bedtime.   metoprolol tartrate (LOPRESSOR) 100 MG tablet Take 1 tablet (100 mg total) by mouth 2 (two) times daily.   [DISCONTINUED] albuterol (VENTOLIN HFA) 108 (90 Base) MCG/ACT inhaler Inhale 2 puffs into the lungs every 6 (six) hours as needed for wheezing or shortness of breath.   [DISCONTINUED] CREON 36000-114000 units CPEP capsule Take 1  capsule by mouth daily.   [DISCONTINUED] loperamide (IMODIUM) 2 MG capsule Take 1 capsule (2 mg total) by mouth every 6 (six) hours as needed for diarrhea or loose stools.   [DISCONTINUED] meclizine (ANTIVERT) 12.5 MG tablet Take 12.5 mg by mouth 3 (three) times daily as needed.   [DISCONTINUED] Multiple Vitamins-Minerals (HAIR SKIN AND NAILS FORMULA) TABS Take 3 tablets by mouth daily.   [DISCONTINUED] triamcinolone cream (KENALOG) 0.1 % Apply 1 Application topically 2 (two) times daily.   No facility-administered encounter medications on file as of 05/06/2022.    ALLERGIES:  Allergies  Allergen Reactions   Codeine Other (See Comments)    Unknown reaction, tolerates  hydrocodone    Morphine And Related     Vein became red     PHYSICAL EXAM:  ECOG Performance status: 1  Vitals:   05/06/22 1420  BP: (!) 175/94  Pulse: 72  Resp: 18  Temp: 97.9 F (36.6 C)  SpO2: 96%   Filed Weights   05/06/22 1420  Weight: 169 lb 3.2 oz (76.7 kg)   Physical Exam Vitals reviewed.  Constitutional:      Appearance: Normal appearance.  Cardiovascular:     Rate and Rhythm: Normal rate and regular rhythm.     Heart sounds: Normal heart sounds.  Pulmonary:     Effort: Pulmonary effort is normal.     Breath sounds: Normal breath sounds.  Abdominal:     Palpations: Abdomen is soft. There is no mass.  Neurological:     Mental Status: She is alert.  Psychiatric:        Mood and Affect: Mood normal.        Behavior: Behavior normal.      LABORATORY DATA:  I have reviewed the labs as listed.  CBC    Component Value Date/Time   WBC 7.6 12/25/2021 1427   RBC 4.67 12/25/2021 1427   HGB 15.0 12/25/2021 1427   HCT 46.6 (H) 12/25/2021 1427   PLT 381 12/25/2021 1427   MCV 99.8 12/25/2021 1427   MCH 32.1 12/25/2021 1427   MCHC 32.2 12/25/2021 1427   RDW 13.7 12/25/2021 1427   LYMPHSABS 1.3 11/08/2021 0800   MONOABS 1.1 (H) 11/08/2021 0800   EOSABS 0.2 11/08/2021 0800   BASOSABS 0.1 11/08/2021 0800      Latest Ref Rng & Units 04/29/2022    1:15 PM 12/25/2021    2:27 PM 11/08/2021    8:00 AM  CMP  Glucose 70 - 99 mg/dL 92  100  114   BUN 8 - 23 mg/dL 22  15  9    Creatinine 0.44 - 1.00 mg/dL 1.10  1.00  0.78   Sodium 135 - 145 mmol/L 140  139  137   Potassium 3.5 - 5.1 mmol/L 4.1  5.0  3.9   Chloride 98 - 111 mmol/L 107  108  101   CO2 22 - 32 mmol/L 24  20  24    Calcium 8.9 - 10.3 mg/dL 9.2  9.4  9.0   Total Protein 6.5 - 8.1 g/dL 7.2   7.1   Total Bilirubin 0.3 - 1.2 mg/dL 0.7   1.6   Alkaline Phos 38 - 126 U/L 71   135   AST 15 - 41 U/L 24   15   ALT 0 - 44 U/L 21   31     DIAGNOSTIC IMAGING:  I have independently reviewed the scans and  discussed with the patient.  ASSESSMENT: Thromboembolism of SMA: -  Admission to [facility] from 10/11/2021 through 10/16/2021 with UTI, diagnosed with A-fib and started on Eliquis. - Admitted to [facility] from 10/21/2021 - 10/26/2021 for weakness and diarrhea. - CT [facility] with contrast (10/21/2021): Abnormal bowel wall thickening of multiple loops of jejunum, suspected acute emboli within 2 proximal jejunal branches of SMA.  Both branches appear to have some contrast extending distal to the emboli which may indicate lack of total occlusion or collateralization.  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  Appearance raises concern for central source of emboli.  There is some moderate abdominal aortic atherosclerotic calcification although relatively little calcified atherosclerosis in the SMA itself. - She is being continued on Eliquis, which she is tolerating very well.  Denies any abdominal pain today.  No prior history of miscarriages.  No prior DVT/PE. - Hypercoagulable testing (10/22/2021): LA negative, anticardiolipin antibody negative, factor V Leiden and prothrombin gene mutation negative    Social/family history: - She lives by herself at home and is independent of ADLs and IADLs.  Sister lives close by and is accompanying her today.  She is a retired Librarian, academic.  Quit smoking 45 years ago.  No alcohol use. - No family history of miscarriages or lupus anticoagulant.  Mother had non-Hodgkin's lymphoma.  Maternal grandmother had stomach cancer.  3 maternal uncles had cancer.   PLAN:  1.  Thromboembolism of SMA/splenic infarct: - She does not report any abdominal pain at this time. - Reviewed labs from 04/29/2022 which showed normal LFTs.  Antibeta 2 glycoprotein 1 antibody was negative.  D-dimer was undetectable. - Continue Eliquis indefinitely as she needs it for A-fib. - She is reportedly having ablation for A-fib on 06/08/2022.  RTC 6 months for follow-up with  repeat D-dimer.  2.  Chronic diarrhea: - She had diarrhea for 3 to 4 years since pancreatitis diagnosed. - She stopped taking Creon and diarrhea has improved.      Orders placed this encounter:  Orders Placed This Encounter  Procedures   D-dimer, quantitative      Derek Jack, MD [place] [phone]

## 2022-05-13 ENCOUNTER — Encounter: Payer: Self-pay | Admitting: *Deleted

## 2022-05-13 ENCOUNTER — Telehealth: Payer: Self-pay | Admitting: *Deleted

## 2022-05-15 NOTE — Telephone Encounter (Signed)
Left message to call back  

## 2022-05-16 ENCOUNTER — Ambulatory Visit: Payer: Medicare Other | Attending: Cardiology

## 2022-05-16 DIAGNOSIS — N1831 Chronic kidney disease, stage 3a: Secondary | ICD-10-CM | POA: Diagnosis not present

## 2022-05-16 DIAGNOSIS — I4819 Other persistent atrial fibrillation: Secondary | ICD-10-CM

## 2022-05-16 DIAGNOSIS — I159 Secondary hypertension, unspecified: Secondary | ICD-10-CM | POA: Diagnosis not present

## 2022-05-16 LAB — BASIC METABOLIC PANEL
BUN/Creatinine Ratio: 17 (ref 12–28)
BUN: 19 mg/dL (ref 8–27)
CO2: 24 mmol/L (ref 20–29)
Calcium: 9.6 mg/dL (ref 8.7–10.3)
Chloride: 103 mmol/L (ref 96–106)
Creatinine, Ser: 1.14 mg/dL — ABNORMAL HIGH (ref 0.57–1.00)
Glucose: 87 mg/dL (ref 70–99)
Potassium: 4.9 mmol/L (ref 3.5–5.2)
Sodium: 142 mmol/L (ref 134–144)
eGFR: 50 mL/min/{1.73_m2} — ABNORMAL LOW (ref >59)

## 2022-05-20 NOTE — Telephone Encounter (Signed)
Left message to call back.   The patient did not pick up CT and Ablation instructions when she got her lab work, can be mailed or read on [employer].

## 2022-05-22 NOTE — Progress Notes (Unsigned)
Cardiology Office Note    Date:  05/22/2022   ID:  Shelly Maldonado, DOB 1946-02-26, MRN [id number]  PCP:  Toma Deiters, MD  Cardiologist: Meriam Sprague, MD    No chief complaint on file.   History of Present Illness:    Shelly Maldonado is a 76 y.o. female with past medical history of persistent atrial fibrillation (s/p DCCV in 12/2021 with recurrence later that month), prior ischemic enteritis and splenic infarct, HTN and chronic pancreatitis who presents to the office today for 4-month follow-up.  She was examined by Dr. Shari Prows in 02/2022 and reported more dyspnea on exertion and dizziness since being back in atrial fibrillation. She was continued on Metoprolol 100 mg twice daily along with Eliquis 5 mg twice daily for anticoagulation and did have scheduled follow-up with EP to discuss ablation. She did meet with Dr. Lalla Brothers in 02/2022 and options were reviewed in regard to rhythm control with antiarrhythmics versus catheter ablation and she wished to proceed with ablation. She is scheduled for a cardiac MRI on 05/29/2022 with plans for ablation on 06/06/2022.  In talking with the patient today, she denies any recent chest pain or palpitations but has experienced dyspnea on exertion and reports this had improved when previously back in normal sinus rhythm. She is looking forward to undergoing ablation later this month in hopes that her respiratory status will improve.  She continues to remain active at home and has recently been pressure washing her house. She denies any recent orthopnea, PND or pitting edema. She remains on Eliquis for anticoagulation with no recent melena, hematochezia or hematuria.    Past Medical History:  Diagnosis Date   Atrial fibrillation (H[facility])    Hypertension    Pancreatitis    Shingles     Past Surgical History:  Procedure Laterality Date   CARDIOVERSION N/A 01/02/2022   Procedure: CARDIOVERSION;  Surgeon: Chrystie Nose, MD;  Location: [facility]  ENDOSCOPY;  Service: Cardiovascular;  Laterality: N/A;   CATARACT EXTRACTION Bilateral 2016   CHOLECYSTECTOMY     COLONOSCOPY N/A 03/04/2018   Procedure: COLONOSCOPY;  Surgeon: Malissa Hippo, MD;  Location[facility] ENDO [facility];  Service: Endoscopy;  Laterality: N/A;  12:45   complete hysterecomy      Current Medications: Outpatient Medications Prior to Visit  Medication Sig Dispense Refill   ELIQUIS 5 MG TABS tablet Take 1 tablet (5 mg total) by mouth 2 (two) times daily. 60 tablet 5   levocetirizine (XYZAL) 5 MG tablet Take 5 mg by mouth at bedtime.     metoprolol tartrate (LOPRESSOR) 100 MG tablet Take 1 tablet (100 mg total) by mouth 2 (two) times daily. 180 tablet 3   No facility-administered medications prior to visit.     Allergies:   Codeine and Morphine and related   Social History   Socioeconomic History   Marital status: Divorced    Spouse name: Not on file   Number of children: Not on file   Years of education: Not on file   Highest education level: Not on file  Occupational History   Not on file  Tobacco Use   Smoking status: Former   Smokeless tobacco: Never   Tobacco comments:    Former smoker 12/25/21 smoked 48 yrs ago.   Vaping Use   Vaping Use: Never used  Substance and Sexual Activity   Alcohol use: Never   Drug use: Never   Sexual activity: Not on file  Other Topics Concern   Not  on file  Social History Narrative   Not on file   Social Determinants of Health   Financial Resource Strain: Not on file  Food Insecurity: Not on file  Transportation Needs: Not on file  Physical Activity: Not on file  Stress: Not on file  Social Connections: Not on file     Family History:  The patient's family history includes Cancer in her mother; Heart attack in her father.   Review of Systems:    Please see the history of present illness.     All other systems reviewed and are otherwise negative except as noted above.   Physical Exam:    VS:  There were  no vitals taken for this visit.   General: Well developed, well nourished,female appearing in no acute distress. Head: Normocephalic, atraumatic. Neck: No carotid bruits. JVD not elevated.  Lungs: Respirations regular and unlabored, without wheezes or rales.  Heart: Irregular irregular. No S3 or S4.  No murmur, no rubs, or gallops appreciated. Abdomen: Appears non-distended. No obvious abdominal masses. Msk:  Strength and tone appear normal for age. No obvious joint deformities or effusions. Extremities: No clubbing or cyanosis. No pitting edema.  Distal pedal pulses are 2+ bilaterally. Neuro: Alert and oriented X 3. Moves all extremities spontaneously. No focal deficits noted. Psych:  Responds to questions appropriately with a normal affect. Skin: No rashes or lesions noted  Wt Readings from Last 3 Encounters:  05/06/22 169 lb 3.2 oz (76.7 kg)  03/12/22 169 lb 12.8 oz (77 kg)  03/09/22 170 lb 6.7 oz (77.3 kg)     Studies/Labs Reviewed:   EKG:  EKG is not ordered today.    Recent Labs: 10/22/2021: TSH 6.342 11/08/2021: B Natriuretic Peptide 798.0; Magnesium 1.8 12/25/2021: Hemoglobin 15.0; Platelets 381 04/29/2022: ALT 21 05/16/2022: BUN 19; Creatinine, Ser 1.14; Potassium 4.9; Sodium 142   Lipid Panel    Component Value Date/Time   CHOL 101 10/22/2021 0351   TRIG 83 10/22/2021 0351   HDL 30 (L) 10/22/2021 0351   CHOLHDL 3.4 10/22/2021 0351   VLDL 17 10/22/2021 0351   LDLCALC 54 10/22/2021 0351   LDLCALC 94 01/27/2018 1515    Additional studies/ records that were reviewed today include:   Echocardiogram: 10/2021 IMPRESSIONS     1. Left ventricular ejection fraction, by estimation, is 60 to 65%. The  left ventricle has normal function. The left ventricle has no regional  wall motion abnormalities. There is mild left ventricular hypertrophy of  the basal-septal segment. Left  ventricular diastolic function could not be evaluated.   2. Right ventricular systolic function  is normal. The right ventricular  size is normal. There is moderately elevated pulmonary artery systolic  pressure. The estimated right ventricular systolic pressure is [id number] mmHg.   3. The pericardial effusion is anterior to the right ventricle.   4. The mitral valve is normal in structure. Trivial mitral valve  regurgitation. No evidence of mitral stenosis.   5. The aortic valve is normal in structure. Aortic valve regurgitation is  not visualized. No aortic stenosis is present.   6. The inferior vena cava is normal in size with greater than 50%  respiratory variability, suggesting right atrial pressure of 3 mmHg.   Assessment:    No diagnosis found.   Plan:   In order of problems listed above:  1. Persistent Atrial Fibrillation/Use of Anticoagulation -She denies any palpitations or chest pain but has experienced dyspnea on exertion since being diagnosed with atrial fibrillation in  the past.  She is scheduled for a cardiac MRI on 05/29/2022 with planned atrial fibrillation ablation on 06/06/2022.  The procedure was previously reviewed with her by Dr. Quentin Ore and we discussed this again today with remaining questions answered. -For now, continue current medical therapy with Lopressor 100 mg twice daily and Eliquis 5 mg twice daily for anticoagulation (appropriate dose at this time given her age, weight and kidney function).    2. HTN - She reports her BP was recently elevated an office visit but it is well controlled at 120/62 today. Encouraged her to follow readings in the ambulatory setting. Continue Lopressor 100 mg twice daily.  3. History of Ischemic Enteritis/Splenic Infarct -She denies any recent GI symptoms and has been off Creon.  Remains on Eliquis 5 mg twice daily for anticoagulation.   Given her upcoming ablation and subsequent follow-up visit with Atrial Fibrillation Clinic and with Dr. Quentin Ore, will have her follow-up with General Cardiology in 6 months.  Medication  Adjustments/Labs and Tests Ordered: Current medicines are reviewed at length with the patient today.  Concerns regarding medicines are outlined above.  Medication changes, Labs and Tests ordered today are listed in the Patient Instructions below. There are no Patient Instructions on file for this visit.   Signed, Erma Heritage, PA-C  05/22/2022 12:37 PM    [facility] [address] [place], [place] [address] Phone: [phone] Fax: [phone]

## 2022-05-23 ENCOUNTER — Ambulatory Visit: Payer: Medicare Other | Attending: Student | Admitting: Student

## 2022-05-23 ENCOUNTER — Encounter: Payer: Self-pay | Admitting: Student

## 2022-05-23 VITALS — BP 120/62 | HR 76 | Ht 64.5 in | Wt 172.0 lb

## 2022-05-23 DIAGNOSIS — I4819 Other persistent atrial fibrillation: Secondary | ICD-10-CM | POA: Diagnosis not present

## 2022-05-23 DIAGNOSIS — I749 Embolism and thrombosis of unspecified artery: Secondary | ICD-10-CM | POA: Diagnosis not present

## 2022-05-23 DIAGNOSIS — I10 Essential (primary) hypertension: Secondary | ICD-10-CM | POA: Diagnosis not present

## 2022-05-23 DIAGNOSIS — Z7901 Long term (current) use of anticoagulants: Secondary | ICD-10-CM | POA: Diagnosis not present

## 2022-05-23 DIAGNOSIS — D735 Infarction of spleen: Secondary | ICD-10-CM | POA: Insufficient documentation

## 2022-05-23 NOTE — Patient Instructions (Signed)
Medication Instructions:  Your physician recommends that you continue on your current medications as directed. Please refer to the Current Medication list given to you today.   Labwork: None today  Testing/Procedures: None today  Follow-Up: 6 months  Any Other Special Instructions Will Be Listed Below (If Applicable).  If you need a refill on your cardiac medications before your next appointment, please call your pharmacy.  

## 2022-05-24 ENCOUNTER — Encounter: Payer: Self-pay | Admitting: Student

## 2022-05-26 NOTE — Telephone Encounter (Signed)
Shelly Maldonado has been able to get into mychart and review messages.

## 2022-05-28 ENCOUNTER — Telehealth (HOSPITAL_COMMUNITY): Payer: Self-pay | Admitting: Emergency Medicine

## 2022-05-28 NOTE — Telephone Encounter (Signed)
Reaching out to patient to offer assistance regarding upcoming cardiac imaging study; pt verbalizes understanding of appt date/time, parking situation and where to check in, pre-test NPO status and medications ordered, and verified current allergies; name and call back number provided for further questions should they arise Gustabo Gordillo RN Navigator Cardiac Imaging [facility] [phone] office [phone] cell 

## 2022-05-29 ENCOUNTER — Ambulatory Visit (HOSPITAL_BASED_OUTPATIENT_CLINIC_OR_DEPARTMENT_OTHER)
Admission: RE | Admit: 2022-05-29 | Discharge: 2022-05-29 | Disposition: A | Discharge status: Home / Self Care | Payer: Medicare Other | Source: Ambulatory Visit | Attending: Cardiology | Admitting: Cardiology

## 2022-05-29 ENCOUNTER — Encounter (HOSPITAL_BASED_OUTPATIENT_CLINIC_OR_DEPARTMENT_OTHER): Payer: Self-pay

## 2022-05-29 DIAGNOSIS — I159 Secondary hypertension, unspecified: Secondary | ICD-10-CM | POA: Diagnosis not present

## 2022-05-29 DIAGNOSIS — I4819 Other persistent atrial fibrillation: Secondary | ICD-10-CM

## 2022-05-29 DIAGNOSIS — N1831 Chronic kidney disease, stage 3a: Secondary | ICD-10-CM | POA: Diagnosis not present

## 2022-05-29 MED ORDER — IOHEXOL 350 MG/ML SOLN
100.0000 mL | Freq: Once | INTRAVENOUS | Status: AC | PRN
Start: 2022-05-29 — End: 2022-05-29
  Administered 2022-05-29: 80 mL via INTRAVENOUS

## 2022-06-02 ENCOUNTER — Telehealth: Payer: Self-pay

## 2022-06-02 ENCOUNTER — Other Ambulatory Visit: Payer: Self-pay

## 2022-06-02 DIAGNOSIS — I4819 Other persistent atrial fibrillation: Secondary | ICD-10-CM

## 2022-06-02 NOTE — Telephone Encounter (Signed)
In reviewing pt's chart for her upcoming procedure I noticed her BMET was done but no CBC. I called pt and she is willing to go to [facility] on Tuesday 11/21 for these labs.  I reviewed Instructions with her and she had no additional questions regarding her procedure.

## 2022-06-03 ENCOUNTER — Other Ambulatory Visit (HOSPITAL_COMMUNITY)
Admission: RE | Admit: 2022-06-03 | Discharge: 2022-06-03 | Disposition: A | Discharge status: Home / Self Care | Payer: Medicare Other | Source: Ambulatory Visit | Attending: Cardiology | Admitting: Cardiology

## 2022-06-03 DIAGNOSIS — I4819 Other persistent atrial fibrillation: Secondary | ICD-10-CM | POA: Diagnosis not present

## 2022-06-03 LAB — CBC WITH DIFFERENTIAL/PLATELET
Abs Immature Granulocytes: 0.01 10*3/uL (ref 0.00–0.07)
Basophils Absolute: 0.1 10*3/uL (ref 0.0–0.1)
Basophils Relative: 2 %
Eosinophils Absolute: 0.2 10*3/uL (ref 0.0–0.5)
Eosinophils Relative: 3 %
HCT: 41.8 % (ref 36.0–46.0)
Hemoglobin: 14.3 g/dL (ref 12.0–15.0)
Immature Granulocytes: 0 %
Lymphocytes Relative: 23 %
Lymphs Abs: 1.5 10*3/uL (ref 0.7–4.0)
MCH: 33.4 pg (ref 26.0–34.0)
MCHC: 34.2 g/dL (ref 30.0–36.0)
MCV: 97.7 fL (ref 80.0–100.0)
Monocytes Absolute: 0.8 10*3/uL (ref 0.1–1.0)
Monocytes Relative: 12 %
Neutro Abs: 4 10*3/uL (ref 1.7–7.7)
Neutrophils Relative %: 60 %
Platelets: 369 10*3/uL (ref 150–400)
RBC: 4.28 MIL/uL (ref 3.87–5.11)
RDW: 13.5 % (ref 11.5–15.5)
WBC: 6.6 10*3/uL (ref 4.0–10.5)
nRBC: 0 % (ref 0.0–0.2)

## 2022-06-04 NOTE — Pre-Procedure Instructions (Signed)
Instructed patient on the following items: Arrival time 0830 Nothing to eat or drink after midnight No meds AM of procedure Responsible person to drive you home and stay with you for 24 hrs  Have you missed any doses of anti-coagulant Eliquis- hasn't missed any doses   

## 2022-06-06 ENCOUNTER — Encounter (HOSPITAL_COMMUNITY): Payer: Self-pay | Admitting: Cardiology

## 2022-06-06 ENCOUNTER — Ambulatory Visit (HOSPITAL_COMMUNITY): Payer: Medicare Other | Admitting: Anesthesiology

## 2022-06-06 ENCOUNTER — Ambulatory Visit (HOSPITAL_COMMUNITY)
Admission: RE | Admit: 2022-06-06 | Discharge: 2022-06-06 | Disposition: A | Discharge status: Home / Self Care | Payer: Medicare Other | Attending: Cardiology | Admitting: Cardiology

## 2022-06-06 ENCOUNTER — Encounter (HOSPITAL_COMMUNITY)
Admission: RE | Disposition: A | Discharge status: Home / Self Care | Payer: Self-pay | Source: Home / Self Care | Attending: Cardiology

## 2022-06-06 ENCOUNTER — Other Ambulatory Visit: Payer: Self-pay

## 2022-06-06 ENCOUNTER — Other Ambulatory Visit (HOSPITAL_COMMUNITY): Payer: Self-pay

## 2022-06-06 ENCOUNTER — Ambulatory Visit (HOSPITAL_BASED_OUTPATIENT_CLINIC_OR_DEPARTMENT_OTHER): Payer: Medicare Other | Admitting: Anesthesiology

## 2022-06-06 DIAGNOSIS — Z87891 Personal history of nicotine dependence: Secondary | ICD-10-CM

## 2022-06-06 DIAGNOSIS — N1831 Chronic kidney disease, stage 3a: Secondary | ICD-10-CM | POA: Diagnosis not present

## 2022-06-06 DIAGNOSIS — N189 Chronic kidney disease, unspecified: Secondary | ICD-10-CM

## 2022-06-06 DIAGNOSIS — I4819 Other persistent atrial fibrillation: Secondary | ICD-10-CM | POA: Insufficient documentation

## 2022-06-06 DIAGNOSIS — I4891 Unspecified atrial fibrillation: Secondary | ICD-10-CM | POA: Diagnosis not present

## 2022-06-06 DIAGNOSIS — Z7901 Long term (current) use of anticoagulants: Secondary | ICD-10-CM | POA: Diagnosis not present

## 2022-06-06 DIAGNOSIS — I129 Hypertensive chronic kidney disease with stage 1 through stage 4 chronic kidney disease, or unspecified chronic kidney disease: Secondary | ICD-10-CM | POA: Diagnosis not present

## 2022-06-06 HISTORY — PX: ATRIAL FIBRILLATION ABLATION: EP1191

## 2022-06-06 LAB — POCT ACTIVATED CLOTTING TIME
Activated Clotting Time: 293 seconds
Activated Clotting Time: 329 seconds

## 2022-06-06 SURGERY — ATRIAL FIBRILLATION ABLATION
Anesthesia: General

## 2022-06-06 MED ORDER — COLCHICINE 0.6 MG PO TABS
0.6000 mg | ORAL_TABLET | Freq: Two times a day (BID) | ORAL | Status: DC
  Administered 2022-06-06: 0.6 mg via ORAL
  Filled 2022-06-06: qty 1

## 2022-06-06 MED ORDER — HEPARIN (PORCINE) IN NACL 1000-0.9 UT/500ML-% IV SOLN
INTRAVENOUS | Status: DC | PRN
  Administered 2022-06-06 (×3): 500 mL

## 2022-06-06 MED ORDER — PROPOFOL 500 MG/50ML IV EMUL
INTRAVENOUS | Status: DC | PRN
  Administered 2022-06-06: 50 ug/kg/min via INTRAVENOUS

## 2022-06-06 MED ORDER — FENTANYL CITRATE (PF) 250 MCG/5ML IJ SOLN
INTRAMUSCULAR | Status: DC | PRN
  Administered 2022-06-06 (×2): 50 ug via INTRAVENOUS

## 2022-06-06 MED ORDER — HEPARIN (PORCINE) IN NACL 1000-0.9 UT/500ML-% IV SOLN
INTRAVENOUS | Status: AC
  Filled 2022-06-06: qty 2000

## 2022-06-06 MED ORDER — PROPOFOL 10 MG/ML IV BOLUS
INTRAVENOUS | Status: DC | PRN
  Administered 2022-06-06: 50 mg via INTRAVENOUS
  Administered 2022-06-06: 150 mg via INTRAVENOUS

## 2022-06-06 MED ORDER — HEPARIN SODIUM (PORCINE) 1000 UNIT/ML IJ SOLN
INTRAMUSCULAR | Status: DC | PRN
  Administered 2022-06-06: 12000 [IU] via INTRAVENOUS
  Administered 2022-06-06: 4000 [IU] via INTRAVENOUS

## 2022-06-06 MED ORDER — HEPARIN SODIUM (PORCINE) 1000 UNIT/ML IJ SOLN
INTRAMUSCULAR | Status: AC
  Filled 2022-06-06: qty 10

## 2022-06-06 MED ORDER — PHENYLEPHRINE HCL-NACL 20-0.9 MG/250ML-% IV SOLN
INTRAVENOUS | Status: DC | PRN
  Administered 2022-06-06: 10 ug/min via INTRAVENOUS

## 2022-06-06 MED ORDER — COLCHICINE 0.6 MG PO TABS
0.6000 mg | ORAL_TABLET | Freq: Two times a day (BID) | ORAL | 0 refills | Status: DC
  Filled 2022-06-06: qty 10, 5d supply, fill #0

## 2022-06-06 MED ORDER — PANTOPRAZOLE SODIUM 40 MG PO TBEC
40.0000 mg | DELAYED_RELEASE_TABLET | Freq: Every day | ORAL | 0 refills | Status: DC
  Filled 2022-06-06: qty 45, 45d supply, fill #0

## 2022-06-06 MED ORDER — SODIUM CHLORIDE 0.9 % IV SOLN: INTRAVENOUS | Status: DC

## 2022-06-06 MED ORDER — SODIUM CHLORIDE 0.9% FLUSH: 3.0000 mL | INTRAVENOUS | Status: DC | PRN

## 2022-06-06 MED ORDER — ONDANSETRON HCL 4 MG/2ML IJ SOLN
INTRAMUSCULAR | Status: DC | PRN
  Administered 2022-06-06: 4 mg via INTRAVENOUS

## 2022-06-06 MED ORDER — HEPARIN (PORCINE) IN NACL 1000-0.9 UT/500ML-% IV SOLN
INTRAVENOUS | Status: AC
  Filled 2022-06-06: qty 500

## 2022-06-06 MED ORDER — HEPARIN SODIUM (PORCINE) 1000 UNIT/ML IJ SOLN
INTRAMUSCULAR | Status: DC | PRN
  Administered 2022-06-06: 1000 [IU] via INTRAVENOUS

## 2022-06-06 MED ORDER — PHENYLEPHRINE 80 MCG/ML (10ML) SYRINGE FOR IV PUSH (FOR BLOOD PRESSURE SUPPORT)
PREFILLED_SYRINGE | INTRAVENOUS | Status: DC | PRN
  Administered 2022-06-06: 80 ug via INTRAVENOUS
  Administered 2022-06-06: 160 ug via INTRAVENOUS
  Administered 2022-06-06 (×2): 80 ug via INTRAVENOUS

## 2022-06-06 MED ORDER — ONDANSETRON HCL 4 MG/2ML IJ SOLN: 4.0000 mg | Freq: Four times a day (QID) | INTRAMUSCULAR | Status: DC | PRN

## 2022-06-06 MED ORDER — ACETAMINOPHEN 325 MG PO TABS: 650.0000 mg | ORAL_TABLET | ORAL | Status: DC | PRN

## 2022-06-06 MED ORDER — LIDOCAINE 2% (20 MG/ML) 5 ML SYRINGE
INTRAMUSCULAR | Status: DC | PRN
  Administered 2022-06-06: 60 mg via INTRAVENOUS

## 2022-06-06 MED ORDER — SODIUM CHLORIDE 0.9 % IV SOLN: 250.0000 mL | INTRAVENOUS | Status: DC | PRN

## 2022-06-06 MED ORDER — SUGAMMADEX SODIUM 200 MG/2ML IV SOLN
INTRAVENOUS | Status: DC | PRN
  Administered 2022-06-06: 200 mg via INTRAVENOUS

## 2022-06-06 MED ORDER — DEXAMETHASONE SODIUM PHOSPHATE 10 MG/ML IJ SOLN
INTRAMUSCULAR | Status: DC | PRN
  Administered 2022-06-06: 10 mg via INTRAVENOUS

## 2022-06-06 MED ORDER — PROTAMINE SULFATE 10 MG/ML IV SOLN
INTRAVENOUS | Status: DC | PRN
  Administered 2022-06-06: 35 mg via INTRAVENOUS

## 2022-06-06 MED ORDER — SODIUM CHLORIDE 0.9% FLUSH: 3.0000 mL | Freq: Two times a day (BID) | INTRAVENOUS | Status: DC

## 2022-06-06 MED ORDER — SUCCINYLCHOLINE CHLORIDE 200 MG/10ML IV SOSY
PREFILLED_SYRINGE | INTRAVENOUS | Status: DC | PRN
  Administered 2022-06-06: 120 mg via INTRAVENOUS

## 2022-06-06 MED ORDER — PANTOPRAZOLE SODIUM 40 MG PO TBEC
40.0000 mg | DELAYED_RELEASE_TABLET | Freq: Every day | ORAL | Status: DC
  Administered 2022-06-06: 40 mg via ORAL
  Filled 2022-06-06: qty 1

## 2022-06-06 MED ORDER — APIXABAN 5 MG PO TABS
5.0000 mg | ORAL_TABLET | Freq: Two times a day (BID) | ORAL | Status: DC
  Administered 2022-06-06: 5 mg via ORAL
  Filled 2022-06-06: qty 1

## 2022-06-06 SURGICAL SUPPLY — 18 items
CATH 8FR REPROCESSED SOUNDSTAR (CATHETERS) ×1 IMPLANT
CATH 8FR SOUNDSTAR REPROCESSED (CATHETERS) IMPLANT
CATH ABLAT QDOT MICRO BI TC FJ (CATHETERS) IMPLANT
CATH OCTARAY 2.0 F 3-3-3-3-3 (CATHETERS) IMPLANT
CATH S-M CIRCA TEMP PROBE (CATHETERS) IMPLANT
CATH WEBSTER BI DIR CS D-F CRV (CATHETERS) IMPLANT
CLOSURE PERCLOSE PROSTYLE (VASCULAR PRODUCTS) IMPLANT
COVER SWIFTLINK CONNECTOR (BAG) ×1 IMPLANT
PACK EP LATEX FREE (CUSTOM PROCEDURE TRAY) ×1
PACK EP LF (CUSTOM PROCEDURE TRAY) ×1 IMPLANT
PAD DEFIB RADIO PHYSIO CONN (PAD) ×1 IMPLANT
PATCH CARTO3 (PAD) IMPLANT
SHEATH BAYLIS TRANSSEPTAL 98CM (NEEDLE) IMPLANT
SHEATH CARTO VIZIGO SM CVD (SHEATH) IMPLANT
SHEATH PINNACLE 8F 10CM (SHEATH) IMPLANT
SHEATH PINNACLE 9F 10CM (SHEATH) IMPLANT
SHEATH PROBE COVER 6X72 (BAG) IMPLANT
TUBING SMART ABLATE COOLFLOW (TUBING) IMPLANT

## 2022-06-06 NOTE — Anesthesia Procedure Notes (Signed)
Procedure Name: Intubation Date/Time: 06/06/2022 10:15 AM  Performed by: Minerva Ends, CRNAPre-anesthesia Checklist: Patient identified, Emergency Drugs available, Suction available and Patient being monitored Patient Re-evaluated:Patient Re-evaluated prior to induction Oxygen Delivery Method: Circle system utilized Preoxygenation: Pre-oxygenation with 100% oxygen Induction Type: IV induction Ventilation: Mask ventilation without difficulty Laryngoscope Size: Mac and 3 Grade View: Grade II Tube type: Oral Tube size: 7.0 mm Number of attempts: 1 Airway Equipment and Method: Stylet and Oral airway Placement Confirmation: ETT inserted through vocal cords under direct vision, positive ETCO2 and breath sounds checked- equal and bilateral Secured at: 21 cm Tube secured with: Tape Dental Injury: Teeth and Oropharynx as per pre-operative assessment

## 2022-06-06 NOTE — Progress Notes (Signed)
Lalla Brothers, MD came by to see patient and stated that it was okay for her to be discharged at 1615.

## 2022-06-06 NOTE — Discharge Instructions (Addendum)
Post procedure care instructions No driving for 4 days. No lifting over 5 lbs for 1 week. No vigorous or sexual activity for 1 week. You may return to work/your usual activities on 06/14/22. Keep procedure site clean & dry. If you notice increased pain, swelling, bleeding or pus, call/return!  You may shower after 24 hours, but no soaking in baths/hot tubs/pools for 1 week.    You have an appointment set up with the [facility].  Multiple studies have shown that being followed by a dedicated atrial fibrillation clinic in addition to the standard care you receive from your other physicians improves health. We believe that enrollment in the atrial fibrillation clinic will allow [place] to better care for you.   The phone number to the [facility] is [phone]. The clinic is staffed Monday through Friday from 8:30am to 5pm.  Directions: The clinic is located in the [facility], [place] the hospital at the MAIN ENTRANCE "A", use [address] to the 6th floor.  Registration desk to the right of elevators on 6th floor  If you have any trouble locating the clinic, please don't hesitate to call [phone].   Cardiac Ablation, Care After  This sheet gives you information about how to care for yourself after your procedure. Your health care provider may also give you more specific instructions. If you have problems or questions, contact your health care provider. What can I expect after the procedure? After the procedure, it is common to have: Bruising around your puncture site. Tenderness around your puncture site. Skipped heartbeats. If you had an atrial fibrillation ablation, you may have atrial fibrillation during the first several months after your procedure.  Tiredness (fatigue).  Follow these instructions at home: Puncture site care  Follow instructions from your health care provider about how to take care of your puncture site. Make sure  you: If present, leave stitches (sutures), skin glue, or adhesive strips in place. These skin closures may need to stay in place for up to 2 weeks. If adhesive strip edges start to loosen and curl up, you may trim the loose edges. Do not remove adhesive strips completely unless your health care provider tells you to do that. If a large square bandage is present, this may be removed 24 hours after surgery.  Check your puncture site every day for signs of infection. Check for: Redness, swelling, or pain. Fluid or blood. If your puncture site starts to bleed, lie down on your back, apply firm pressure to the area, and contact your health care provider. Warmth. Pus or a bad smell. A pea or small marble sized lump at the site is normal and can take up to three months to resolve.  Driving Do not drive for at least 4 days after your procedure or however long your health care provider recommends. (Do not resume driving if you have previously been instructed not to drive for other health reasons.) Do not drive or use heavy machinery while taking prescription pain medicine. Activity Avoid activities that take a lot of effort for at least 7 days after your procedure. Do not lift anything that is heavier than 5 lb (4.5 kg) for one week.  No sexual activity for 1 week.  Return to your normal activities as told by your health care provider. Ask your health care provider what activities are safe for you. General instructions Take over-the-counter and prescription medicines only as told by your health care provider. Do not  use any products that contain nicotine or tobacco, such as cigarettes and e-cigarettes. If you need help quitting, ask your health care provider. You may shower after 24 hours, but Do not take baths, swim, or use a hot tub for 1 week.  Do not drink alcohol for 24 hours after your procedure. Keep all follow-up visits as told by your health care provider. This is important. Contact a health  care provider if: You have redness, mild swelling, or pain around your puncture site. You have fluid or blood coming from your puncture site that stops after applying firm pressure to the area. Your puncture site feels warm to the touch. You have pus or a bad smell coming from your puncture site. You have a fever. You have chest pain or discomfort that spreads to your neck, jaw, or arm. You have chest pain that is worse with lying on your back or taking a deep breath. You are sweating a lot. You feel nauseous. You have a fast or irregular heartbeat. You have shortness of breath. You are dizzy or light-headed and feel the need to lie down. You have pain or numbness in the arm or leg closest to your puncture site. Get help right away if: Your puncture site suddenly swells. Your puncture site is bleeding and the bleeding does not stop after applying firm pressure to the area. These symptoms may represent a serious problem that is an emergency. Do not wait to see if the symptoms will go away. Get medical help right away. Call your local emergency services (911 in the [place] Do not drive yourself to the hospital. Summary After the procedure, it is normal to have bruising and tenderness at the puncture site in your groin, neck, or forearm. Check your puncture site every day for signs of infection. Get help right away if your puncture site is bleeding and the bleeding does not stop after applying firm pressure to the area. This is a medical emergency. This information is not intended to replace advice given to you by your health care provider. Make sure you discuss any questions you have with your health care provider.

## 2022-06-06 NOTE — Anesthesia Preprocedure Evaluation (Addendum)
Anesthesia Evaluation  Patient identified by MRN, date of birth, ID band Patient awake    Reviewed: Allergy & Precautions, NPO status , Patient's Chart, lab work & pertinent test results, reviewed documented beta blocker date and time   History of Anesthesia Complications Negative for: history of anesthetic complications  Airway Mallampati: II  TM Distance: >3 FB Neck ROM: Full    Dental  (+) Dental Advisory Given, Upper Dentures   Pulmonary former smoker   Pulmonary exam normal        Cardiovascular hypertension, Pt. on medications and Pt. on home beta blockers pulmonary hypertension+ dysrhythmias Atrial Fibrillation  Rhythm:Irregular Rate:Normal   '23 TTE - EF 60 to 65%. There is mild left ventricular hypertrophy of the basal-septal segment. There is moderately elevated pulmonary artery systolic pressure. The estimated right ventricular systolic pressure is 50.1 mmHg. The pericardial effusion is anterior to the right ventricle. Trivial mitral valve regurgitation.     Neuro/Psych negative neurological ROS  negative psych ROS   GI/Hepatic negative GI ROS, Neg liver ROS,,,  Endo/Other  negative endocrine ROS    Renal/GU CRFRenal disease     Musculoskeletal negative musculoskeletal ROS (+)    Abdominal   Peds  Hematology  On eliquis    Anesthesia Other Findings   Reproductive/Obstetrics                             Anesthesia Physical Anesthesia Plan  ASA: 3  Anesthesia Plan: General   Post-op Pain Management: Tylenol PO (pre-op)*   Induction: Intravenous  PONV Risk Score and Plan: 3 and Treatment may vary due to age or medical condition, Ondansetron and Dexamethasone  Airway Management Planned: Oral ETT  Additional Equipment: None  Intra-op Plan:   Post-operative Plan: Extubation in OR  Informed Consent: I have reviewed the patients History and Physical, chart, labs and  discussed the procedure including the risks, benefits and alternatives for the proposed anesthesia with the patient or authorized representative who has indicated his/her understanding and acceptance.     Dental advisory given  Plan Discussed with: CRNA and Anesthesiologist  Anesthesia Plan Comments:        Anesthesia Quick Evaluation

## 2022-06-06 NOTE — H&P (Signed)
Electrophysiology Office Note:     Date:  06/06/2022    ID:  Shelly Maldonado, DOB Jul 24, 1945, MRN [id number]   PCP:  Toma Deiters, MD           [facility] Cardiologist:  Meriam Sprague, MD  [facility] Electrophysiologist:  Lanier Prude, MD    Referring MD: Danice Goltz, PA    Chief Complaint: Atrial fibrillation   History of Present Illness:     Shelly Maldonado is a 76 y.o. female who presents for an evaluation of atrial fibrillation at the request of [name]. Their medical history includes hypertension, aortic atherosclerosis, chronic pancreatitis, ischemic enteritis and atrial fibrillation.  The patient saw Clide Cliff in the A-fib clinic on January 15, 2022.  Her A-fib was diagnosed in March 2023 during the hospitalization in [place].  She was started on Eliquis.  She was subsequently admitted to [facility] October 21, 2021 with ischemic enteritis with a splenic infarct.  The patient had a cardioversion on January 02, 2022.  She went back out of rhythm January 09, 2022.  She is symptomatic when in atrial fibrillation.  She experiences lightheadedness and fatigue.  At the appointment with [facility], rhythm control options were discussed and the patient elected to pursue catheter ablation.   Today:   During afib episodes, she feels dizziness, lightheadedness, and shortness of breath. When she had pneumonia, her afib episodes were more frequent and severe.    She is compliant with Eliquis and Metoprolol. She has no side effects from the medications.   She had a cardioversion, and she felt much better afterwards. However, it didn't last.   She was told by a hospital she visited than she no longer has pancreatitis. While she once had chronic diarrhea, she now only has occasional diarrhea.    Catheter Ablation, Tikosyn, and Amiodarone, were discussed as options for the patient for her Afib.   She has shingles.   She denies any chest pain or peripheral edema. No  headaches, syncope, orthopnea, or PND.   Today doing well. Plan for PVI.    Objective      Past Medical History:  Diagnosis Date   Atrial fibrillation (HCC)     Hypertension     Pancreatitis     Shingles             Past Surgical History:  Procedure Laterality Date   CARDIOVERSION N/A 01/02/2022    Procedure: CARDIOVERSION;  Surgeon: Chrystie Nose, MD;  Location: [facility] ENDOSCOPY;  Service: Cardiovascular;  Laterality: N/A;   CATARACT EXTRACTION Bilateral 2016   CHOLECYSTECTOMY       COLONOSCOPY N/A 03/04/2018    Procedure: COLONOSCOPY;  Surgeon: Malissa Hippo, MD;  Location: [facility] ENDO SUITE;  Service: Endoscopy;  Laterality: N/A;  12:45   complete hysterecomy          Current Medications: Active Medications      Current Meds  Medication Sig   CREON 36000-114000 units CPEP capsule Take 1 capsule by mouth daily.   ELIQUIS 5 MG TABS tablet Take 1 tablet (5 mg total) by mouth 2 (two) times daily.   levocetirizine (XYZAL) 5 MG tablet Take 5 mg by mouth at bedtime.   loperamide (IMODIUM) 2 MG capsule Take 1 capsule (2 mg total) by mouth every 6 (six) hours as needed for diarrhea or loose stools.   meclizine (ANTIVERT) 12.5 MG tablet Take 12.5 mg by mouth 3 (three) times daily as needed.  metoprolol tartrate (LOPRESSOR) 100 MG tablet Take 1 tablet (100 mg total) by mouth 2 (two) times daily.   Multiple Vitamins-Minerals (HAIR SKIN AND NAILS FORMULA) TABS Take 3 tablets by mouth daily.   triamcinolone cream (KENALOG) 0.1 % Apply 1 Application topically 2 (two) times daily.   valACYclovir (VALTREX) 1000 MG tablet Take 1 tablet (1,000 mg total) by mouth 3 (three) times daily for 14 days.        Allergies:   Codeine and Morphine and related    Social History         Socioeconomic History   Marital status: Divorced      Spouse name: Not on file   Number of children: Not on file   Years of education: Not on file   Highest education level: Not on file  Occupational History    Not on file  Tobacco Use   Smoking status: Former   Smokeless tobacco: Never   Tobacco comments:      Former smoker 12/25/21 smoked 48 yrs ago.   Vaping Use   Vaping Use: Never used  Substance and Sexual Activity   Alcohol use: Never   Drug use: Never   Sexual activity: Not on file  Other Topics Concern   Not on file  Social History Narrative   Not on file    Social Determinants of Health    Financial Resource Strain: Not on file  Food Insecurity: Not on file  Transportation Needs: Not on file  Physical Activity: Not on file  Stress: Not on file  Social Connections: Not on file      Family History: The patient's family history includes Cancer in her mother; Heart attack in her father. There is no history of Colon cancer.   ROS:   Please see the history of present illness.      (+)Dizziness (+)Lightheadedness (+)Shortness of Breath (+)Diarrhea (+)Shingles rash   All other systems reviewed and are negative.   EKGs/Labs/Other Studies Reviewed:     The following studies were reviewed today:   October 22, 2021 echo EF 60 to 65% RV function normal Trivial MR Trivial pericardial effusion   EKG: EKG is personally reviewed   March 12, 2022: No EKG ordered.     Recent Labs: 10/22/2021: TSH 6.342 11/08/2021: ALT 31; B Natriuretic Peptide 798.0; Magnesium 1.8 12/25/2021: BUN 15; Creatinine, Ser 1.00; Hemoglobin 15.0; Platelets 381; Potassium 5.0; Sodium 139  Recent Lipid Panel Labs (Brief)          Component Value Date/Time    CHOL 101 10/22/2021 0351    TRIG 83 10/22/2021 0351    HDL 30 (L) 10/22/2021 0351    CHOLHDL 3.4 10/22/2021 0351    VLDL 17 10/22/2021 0351    LDLCALC 54 10/22/2021 0351    LDLCALC 94 01/27/2018 1515        Physical Exam:     VS:  BP 150/80   Pulse 74   Ht 5\' 4"  (1.626 m)   Wt 169 lb 12.8 oz (77 kg)   SpO2 96%   BMI 29.15 kg/m         Wt Readings from Last 3 Encounters:  03/12/22 169 lb 12.8 oz (77 kg)  03/09/22 170 lb 6.7  oz (77.3 kg)  02/13/22 170 lb 6.4 oz (77.3 kg)      GEN:  Well nourished, well developed in no acute distress HEENT: Normal NECK: No JVD; No carotid bruits LYMPHATICS: No lymphadenopathy CARDIAC: Irregularly irregular, no murmurs, rubs,  gallops RESPIRATORY:  Clear to auscultation without rales, wheezing or rhonchi  ABDOMEN: Soft, non-tender, non-distended MUSCULOSKELETAL:  No edema; No deformity  SKIN: Warm and dry NEUROLOGIC:  Alert and oriented x 3 PSYCHIATRIC:  Normal affect          Assessment ASSESSMENT:     1. Persistent atrial fibrillation ([place])   2. Secondary hypertension   3. Stage 3a chronic kidney disease (CKD) (HCC)     PLAN:     In order of problems listed above:     #Persistent atrial fibrillation Patient has symptomatic atrial fibrillation despite cardioversions.  Rhythm control indicated.  Discussed rhythm control options including catheter ablation and antiarrhythmic drug therapy and she wishes to proceed with catheter ablation.   Discussed treatment options today for his AF including antiarrhythmic drug therapy and ablation. Discussed risks, recovery and likelihood of success. Discussed potential need for repeat ablation procedures and antiarrhythmic drugs after an initial ablation. They wish to proceed with scheduling.   Risk, benefits, and alternatives to EP study and radiofrequency ablation for afib were also discussed in detail today. These risks include but are not limited to stroke, bleeding, vascular damage, tamponade, perforation, damage to the esophagus, lungs, and other structures, pulmonary vein stenosis, worsening renal function, and death. The patient understands these risk and wishes to proceed.  We will therefore proceed with catheter ablation at the next available time.  Carto, ICE, anesthesia are requested for the procedure.  Will also obtain CT PV protocol prior to the procedure to exclude LAA thrombus and further evaluate atrial anatomy.      Plan for PVI today. Procedure reviewed.   Signed, Hilton Cork. Quentin Ore, MD, [facility], [facility] 06/06/2022 Electrophysiology [facility]

## 2022-06-06 NOTE — Anesthesia Postprocedure Evaluation (Signed)
Anesthesia Post Note  Patient: Shelly Maldonado  Procedure(s) Performed: ATRIAL FIBRILLATION ABLATION     Patient location during evaluation: PACU Anesthesia Type: General Level of consciousness: awake and alert Pain management: pain level controlled Vital Signs Assessment: post-procedure vital signs reviewed and stable Respiratory status: spontaneous breathing, nonlabored ventilation and respiratory function stable Cardiovascular status: stable and blood pressure returned to baseline Anesthetic complications: no   No notable events documented.  Last Vitals:  Vitals:   06/06/22 1515 06/06/22 1530  BP: 109/88 130/70  Pulse: 65 67  Resp: (!) 23 (!) 23  Temp:    SpO2: 94% 96%    Last Pain:  Vitals:   06/06/22 1309  TempSrc:   PainSc: 0-No pain                 Beryle Lathe

## 2022-06-06 NOTE — Transfer of Care (Signed)
Immediate Anesthesia Transfer of Care Note  Patient: Shelly Maldonado  Procedure(s) Performed: ATRIAL FIBRILLATION ABLATION  Patient Location: PACU  Anesthesia Type:General  Level of Consciousness: awake, alert , and oriented  Airway & Oxygen Therapy: Patient Spontanous Breathing and Patient connected to nasal cannula oxygen  Post-op Assessment: Report given to RN and Post -op Vital signs reviewed and stable  Post vital signs: Reviewed and stable  Last Vitals:  Vitals Value Taken Time  BP 124/64 06/06/22 1200  Temp 98   Pulse 63 06/06/22 1203  Resp 19 06/06/22 1203  SpO2 95 % 06/06/22 1203  Vitals shown include unvalidated device data.  Last Pain:  Vitals:   06/06/22 0824  TempSrc:   PainSc: 0-No pain         Complications: No notable events documented.

## 2022-06-24 ENCOUNTER — Encounter (HOSPITAL_COMMUNITY): Payer: Self-pay | Admitting: Physician Assistant

## 2022-06-24 ENCOUNTER — Ambulatory Visit (HOSPITAL_COMMUNITY)
Admission: RE | Admit: 2022-06-24 | Discharge: 2022-06-24 | Disposition: A | Discharge status: Home / Self Care | Payer: Medicare Other | Source: Ambulatory Visit | Attending: Physician Assistant | Admitting: Physician Assistant

## 2022-06-24 VITALS — BP 140/100 | HR 80 | Ht 64.5 in | Wt 169.0 lb

## 2022-06-24 DIAGNOSIS — Z79899 Other long term (current) drug therapy: Secondary | ICD-10-CM | POA: Diagnosis not present

## 2022-06-24 DIAGNOSIS — D6869 Other thrombophilia: Secondary | ICD-10-CM | POA: Insufficient documentation

## 2022-06-24 DIAGNOSIS — I10 Essential (primary) hypertension: Secondary | ICD-10-CM | POA: Insufficient documentation

## 2022-06-24 DIAGNOSIS — I700 Atherosclerosis of aorta: Secondary | ICD-10-CM | POA: Insufficient documentation

## 2022-06-24 DIAGNOSIS — Z7901 Long term (current) use of anticoagulants: Secondary | ICD-10-CM | POA: Insufficient documentation

## 2022-06-24 DIAGNOSIS — I4819 Other persistent atrial fibrillation: Secondary | ICD-10-CM | POA: Diagnosis not present

## 2022-06-24 LAB — CBC
HCT: 49.5 % — ABNORMAL HIGH (ref 36.0–46.0)
Hemoglobin: 16 g/dL — ABNORMAL HIGH (ref 12.0–15.0)
MCH: 32.9 pg (ref 26.0–34.0)
MCHC: 32.3 g/dL (ref 30.0–36.0)
MCV: 101.6 fL — ABNORMAL HIGH (ref 80.0–100.0)
Platelets: 387 10*3/uL (ref 150–400)
RBC: 4.87 MIL/uL (ref 3.87–5.11)
RDW: 12.8 % (ref 11.5–15.5)
WBC: 8.7 10*3/uL (ref 4.0–10.5)
nRBC: 0 % (ref 0.0–0.2)

## 2022-06-24 LAB — BASIC METABOLIC PANEL
Anion gap: 11 (ref 5–15)
BUN: 14 mg/dL (ref 8–23)
CO2: 23 mmol/L (ref 22–32)
Calcium: 9.6 mg/dL (ref 8.9–10.3)
Chloride: 104 mmol/L (ref 98–111)
Creatinine, Ser: 1.03 mg/dL — ABNORMAL HIGH (ref 0.44–1.00)
GFR, Estimated: 57 mL/min — ABNORMAL LOW (ref >60)
Glucose, Bld: 91 mg/dL (ref 70–99)
Potassium: 4.6 mmol/L (ref 3.5–5.1)
Sodium: 138 mmol/L (ref 135–145)

## 2022-06-24 NOTE — H&P (View-Only) (Signed)
Primary Care Physician: Toma Deiters, MD Primary Cardiologist: Dr Shari Prows Primary Electrophysiologist: Dr Lalla Brothers Referring Physician: Dr Shari Prows   Shelly Maldonado is a 76 y.o. female with a history of HTN, aortic atherosclerosis, chronic pancreatitis, ischemic enteritis, atrial fibrillation who presents for follow up in the [facility].  The patient was initially diagnosed with atrial fibrillation during hospitalization in [place] from 10/11/21-10/16/21. She was started in Eliquis and metoprolol at that time. She was admitted to [facility] 10/21/21 with ischemic enteritis with splenic infarct concerning for embolus as well as pneumonia. Embolic event not felt to be an Eliquis failure as she has just started the medication. Patient has a CHADS2VASC score of 6. Plan was for TEE/DCCV after 3 weeks of anticoagulation. She was admitted for observation 11/08/21 with SOB and orthopnea, found to have bilateral pleural effusions, was diuresed. Seen by Jacolyn Reedy on 11/18/21 and her BB was increased. She has monitored her heart rates since then which have been elevated 100-120 bpm. Her BB was further increased.   Patient is s/p DCCV on 01/02/22. TEE was not performed. Unfortunately, she went back out of rhythm on on 01/09/22 with symptoms of intermittent lightheadedness and fatigue. She was seen by Dr Lalla Brothers and underwent afib ablation 06/06/22.  On follow up today, patient reports that immediately following her ablation she felt "the best she has in a long time". However, about one week ago she noticed increased fatigue and SOB with exertion and intermittent dizziness. It became worse over the weekend. She borrowed her sister's smart watch which showed afib. She did have some mild chest pain this AM which resolved after a several minutes.   Today, she denies symptoms of palpitations, orthopnea, PND, lower extremity edema, syncope, snoring, daytime somnolence, bleeding, or  neurologic sequela. The patient is tolerating medications without difficulties and is otherwise without complaint today.    Atrial Fibrillation Risk Factors:  she does not have symptoms or diagnosis of sleep apnea. she does not have a history of rheumatic fever. she does not have a history of alcohol use. The patient does not have a history of early familial atrial fibrillation or other arrhythmias.  she has a BMI of Body mass index is 28.56 kg/m.Marland Kitchen Filed Weights   06/24/22 1519  Weight: 76.7 kg    Family History  Problem Relation Age of Onset   Cancer Mother    Heart attack Father    Colon cancer Neg Hx      Atrial Fibrillation Management history:  Previous antiarrhythmic drugs: none Previous cardioversions: 01/02/22 Previous ablations: 06/06/22 CHADS2VASC score: 6 Anticoagulation history: Eliquis   Past Medical History:  Diagnosis Date   Atrial fibrillation (HCC)    Hypertension    Pancreatitis    Shingles    Past Surgical History:  Procedure Laterality Date   ATRIAL FIBRILLATION ABLATION N/A 06/06/2022   Procedure: ATRIAL FIBRILLATION ABLATION;  Surgeon: Lanier Prude, MD;  [facility] INVASIVE CV LAB;  Service: Cardiovascular;  Laterality: N/A;   CARDIOVERSION N/A 01/02/2022   Procedure: CARDIOVERSION;  Surgeon: Chrystie Nose, MD;  Location: [facility] ENDOSCOPY;  Service: Cardiovascular;  Laterality: N/A;   CATARACT EXTRACTION Bilateral 2016   CHOLECYSTECTOMY     COLONOSCOPY N/A 03/04/2018   Procedure: COLONOSCOPY;  Surgeon: Malissa Hippo, MD;  Location: [facility] ENDO SUITE;  Service: Endoscopy;  Laterality: N/A;  12:45   complete hysterecomy      Current Outpatient Medications  Medication Sig Dispense Refill   acetaminophen (TYLENOL)  500 MG tablet Take 500 mg by mouth every 6 (six) hours as needed for moderate pain or headache.     ELIQUIS 5 MG TABS tablet Take 1 tablet (5 mg total) by mouth 2 (two) times daily. 60 tablet 5   levocetirizine (XYZAL) 5 MG  tablet Take 5 mg by mouth at bedtime.     meclizine (ANTIVERT) 12.5 MG tablet Take 12.5 mg by mouth 3 (three) times daily as needed for dizziness.     metoprolol tartrate (LOPRESSOR) 100 MG tablet Take 1 tablet (100 mg total) by mouth 2 (two) times daily. 180 tablet 3   metroNIDAZOLE (METROGEL) 1 % gel Apply 1 Application topically daily as needed (rosacea).     pantoprazole (PROTONIX) 40 MG tablet Take 1 tablet (40 mg total) by mouth daily. 45 tablet 0   No current facility-administered medications for this encounter.    Allergies  Allergen Reactions   Codeine Other (See Comments)    Unknown reaction, tolerates hydrocodone    Morphine And Related     Vein became red    Social History   Socioeconomic History   Marital status: Divorced    Spouse name: Not on file   Number of children: Not on file   Years of education: Not on file   Highest education level: Not on file  Occupational History   Not on file  Tobacco Use   Smoking status: Former   Smokeless tobacco: Never   Tobacco comments:    Former smoker 12/25/21 smoked 48 yrs ago.   Vaping Use   Vaping Use: Never used  Substance and Sexual Activity   Alcohol use: Never   Drug use: Never   Sexual activity: Not on file  Other Topics Concern   Not on file  Social History Narrative   Not on file   Social Determinants of Health   Financial Resource Strain: Not on file  Food Insecurity: Not on file  Transportation Needs: Not on file  Physical Activity: Not on file  Stress: Not on file  Social Connections: Not on file  Intimate Partner Violence: Not on file     ROS- All systems are reviewed and negative except as per the HPI above.  Physical Exam: Vitals:   06/24/22 1519  Weight: 76.7 kg  Height: 5' 4.5" (1.638 m)    GEN- The patient is a well appearing elderly female, alert and oriented x 3 today.   HEENT-head normocephalic, atraumatic, sclera clear, conjunctiva pink, hearing intact, trachea midline. Lungs-  Clear to ausculation bilaterally, normal work of breathing Heart- irregular rate and rhythm, no murmurs, rubs or gallops  GI- soft, NT, ND, + BS Extremities- no clubbing, cyanosis, or edema MS- no significant deformity or atrophy Skin- no rash or lesion Psych- euthymic mood, full affect Neuro- strength and sensation are intact   Wt Readings from Last 3 Encounters:  06/24/22 76.7 kg  06/06/22 77.1 kg  05/23/22 78 kg    EKG today demonstrates  Afib Vent. rate 80 BPM PR interval * ms QRS duration 80 ms QT/QTcB 370/426 ms  Echo 10/22/21 demonstrated   1. Left ventricular ejection fraction, by estimation, is 60 to 65%. The  left ventricle has normal function. The left ventricle has no regional  wall motion abnormalities. There is mild left ventricular hypertrophy of  the basal-septal segment. Left ventricular diastolic function could not be evaluated.   2. Right ventricular systolic function is normal. The right ventricular  size is normal. There is moderately  elevated pulmonary artery systolic  pressure. The estimated right ventricular systolic pressure is 50.1 mmHg.   3. The pericardial effusion is anterior to the right ventricle.   4. The mitral valve is normal in structure. Trivial mitral valve  regurgitation. No evidence of mitral stenosis.   5. The aortic valve is normal in structure. Aortic valve regurgitation is  not visualized. No aortic stenosis is present.   6. The inferior vena cava is normal in size with greater than 50%  respiratory variability, suggesting right atrial pressure of 3 mmHg.   Epic records are reviewed at length today  CHA2DS2-VASc Score = 7  The patient's score is based upon: CHF History: 0 HTN History: 1 Diabetes History: 0 Stroke History: 2 (ischemic enteritis and splenic infarct (embolic)) Vascular Disease History: 1 (aortic atherosclerosis) Age Score: 2 Gender Score: 1       ASSESSMENT AND PLAN: 1. Persistent Atrial Fibrillation (ICD10:   I48.19) The patient's CHA2DS2-VASc score is 7, indicating a 11.2% annual risk of stroke.   S/p afib ablation 06/06/22 Patient back in persistent afib. We discussed rhythm control options today, will plan for DCCV. Check bmet/cbc today. Continue Eliquis 5 mg BID Continue Lopressor 100 mg BID  2. Secondary Hypercoagulable State (ICD10:  D68.69) The patient is at significant risk for stroke/thromboembolism based upon her CHA2DS2-VASc Score of 7.  Continue Apixaban (Eliquis).   3. HTN Mildly elevated today, will reassess in SR.    Follow up in the AF clinic post DCCV as scheduled.    [name] PA-C Afib Clinic [facility] [address] [place], [place] [address] [phone] 06/24/2022 3:23 PM

## 2022-06-24 NOTE — Progress Notes (Signed)
  Primary Care Physician: Hasanaj, Xaje A, MD Primary Cardiologist: Dr Pemberton Primary Electrophysiologist: Dr Lambert Referring Physician: Dr Pemberton   Shelly Maldonado is a 76 y.o. female with a history of HTN, aortic atherosclerosis, chronic pancreatitis, ischemic enteritis, atrial fibrillation who presents for follow up in the [facility].  The patient was initially diagnosed with atrial fibrillation during hospitalization in [place] from 10/11/21-10/16/21. She was started in Eliquis and metoprolol at that time. 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 Embolic event not felt to be an Eliquis failure as she has just started the medication. Patient has a CHADS2VASC score of 6. Plan was for TEE/DCCV after 3 weeks of anticoagulation. She was admitted for observation 11/08/21 with SOB and orthopnea, found to have bilateral pleural effusions, was diuresed. Seen by Michele Lenze on 11/18/21 and her BB was increased. She has monitored her heart rates since then which have been elevated 100-120 bpm. Her BB was further increased.   Patient is s/p DCCV on 01/02/22. TEE was not performed. Unfortunately, she went back out of rhythm on on 01/09/22 with symptoms of intermittent lightheadedness and fatigue. She was seen by Dr Lambert and underwent afib ablation 06/06/22.  On follow up today, patient reports that immediately following her ablation she felt "the best she has in a long time". However, about one week ago she noticed increased fatigue and SOB with exertion and intermittent dizziness. It became worse over the weekend. She borrowed her sister's smart watch which showed afib. She did have some mild chest pain this AM which resolved after a several minutes.   Today, she denies symptoms of palpitations, orthopnea, PND, lower extremity edema, syncope, snoring, daytime somnolence, bleeding, or  neurologic sequela. The patient is tolerating medications without difficulties and is otherwise without complaint today.    Atrial Fibrillation Risk Factors:  she does not have symptoms or diagnosis of sleep apnea. she does not have a history of rheumatic fever. she does not have a history of alcohol use. The patient does not have a history of early familial atrial fibrillation or other arrhythmias.  she has a BMI of Body mass index is 28.56 kg/m.. Filed Weights   06/24/22 1519  Weight: 76.7 kg    Family History  Problem Relation Age of Onset   Cancer Mother    Heart attack Father    Colon cancer Neg Hx      Atrial Fibrillation Management history:  Previous antiarrhythmic drugs: none Previous cardioversions: 01/02/22 Previous ablations: 06/06/22 CHADS2VASC score: 6 Anticoagulation history: Eliquis   Past Medical History:  Diagnosis Date   Atrial fibrillation (HCC)    Hypertension    Pancreatitis    Shingles    Past Surgical History:  Procedure Laterality Date   ATRIAL FIBRILLATION ABLATION N/A 06/06/2022   Procedure: ATRIAL FIBRILLATION ABLATION;  Surgeon: Lambert, Cameron T, MD;  Location: [facility] INVASIVE CV LAB;  Service: Cardiovascular;  Laterality: N/A;   CARDIOVERSION N/A 01/02/2022   Procedure: CARDIOVERSION;  Surgeon: Hilty, Kenneth C, MD;  Location: [facility] ENDOSCOPY;  Service: Cardiovascular;  Laterality: N/A;   CATARACT EXTRACTION Bilateral 2016   CHOLECYSTECTOMY     COLONOSCOPY N/A 03/04/2018   Procedure: COLONOSCOPY;  Surgeon: Rehman, Najeeb U, MD;  Location: [facility] ENDO [facility];  Service: Endoscopy;  Laterality: N/A;  12:45   complete hysterecomy      Current Outpatient Medications  Medication Sig Dispense Refill   acetaminophen (TYLENOL)   500 MG tablet Take 500 mg by mouth every 6 (six) hours as needed for moderate pain or headache.     ELIQUIS 5 MG TABS tablet Take 1 tablet (5 mg total) by mouth 2 (two) times daily. 60 tablet 5   levocetirizine (XYZAL) 5 MG  tablet Take 5 mg by mouth at bedtime.     meclizine (ANTIVERT) 12.5 MG tablet Take 12.5 mg by mouth 3 (three) times daily as needed for dizziness.     metoprolol tartrate (LOPRESSOR) 100 MG tablet Take 1 tablet (100 mg total) by mouth 2 (two) times daily. 180 tablet 3   metroNIDAZOLE (METROGEL) 1 % gel Apply 1 Application topically daily as needed (rosacea).     pantoprazole (PROTONIX) 40 MG tablet Take 1 tablet (40 mg total) by mouth daily. 45 tablet 0   No current facility-administered medications for this encounter.    Allergies  Allergen Reactions   Codeine Other (See Comments)    Unknown reaction, tolerates hydrocodone    Morphine And Related     Vein became red    Social History   Socioeconomic History   Marital status: Divorced    Spouse name: Not on file   Number of children: Not on file   Years of education: Not on file   Highest education level: Not on file  Occupational History   Not on file  Tobacco Use   Smoking status: Former   Smokeless tobacco: Never   Tobacco comments:    Former smoker 12/25/21 smoked 48 yrs ago.   Vaping Use   Vaping Use: Never used  Substance and Sexual Activity   Alcohol use: Never   Drug use: Never   Sexual activity: Not on file  Other Topics Concern   Not on file  Social History Narrative   Not on file   Social Determinants of Health   Financial Resource Strain: Not on file  Food Insecurity: Not on file  Transportation Needs: Not on file  Physical Activity: Not on file  Stress: Not on file  Social Connections: Not on file  Intimate Partner Violence: Not on file     ROS- All systems are reviewed and negative except as per the HPI above.  Physical Exam: Vitals:   06/24/22 1519  Weight: 76.7 kg  Height: 5' 4.5" (1.638 m)    GEN- The patient is a well appearing elderly female, alert and oriented x 3 today.   HEENT-head normocephalic, atraumatic, sclera clear, conjunctiva pink, hearing intact, trachea midline. Lungs-  Clear to ausculation bilaterally, normal work of breathing Heart- irregular rate and rhythm, no murmurs, rubs or gallops  GI- soft, NT, ND, + BS Extremities- no clubbing, cyanosis, or edema MS- no significant deformity or atrophy Skin- no rash or lesion Psych- euthymic mood, full affect Neuro- strength and sensation are intact   Wt Readings from Last 3 Encounters:  06/24/22 76.7 kg  06/06/22 77.1 kg  05/23/22 78 kg    EKG today demonstrates  Afib Vent. rate 80 BPM PR interval * ms QRS duration 80 ms QT/QTcB 370/426 ms  Echo 10/22/21 demonstrated   1. Left ventricular ejection fraction, by estimation, is 60 to 65%. The  left ventricle has normal function. The left ventricle has no regional  wall motion abnormalities. There is mild left ventricular hypertrophy of  the basal-septal segment. Left ventricular diastolic function could not be evaluated.   2. Right ventricular systolic function is normal. The right ventricular  size is normal. There is moderately  elevated pulmonary artery systolic  pressure. The estimated right ventricular systolic pressure is 50.1 mmHg.   3. The pericardial effusion is anterior to the right ventricle.   4. The mitral valve is normal in structure. Trivial mitral valve  regurgitation. No evidence of mitral stenosis.   5. The aortic valve is normal in structure. Aortic valve regurgitation is  not visualized. No aortic stenosis is present.   6. The inferior vena cava is normal in size with greater than 50%  respiratory variability, suggesting right atrial pressure of 3 mmHg.   Epic records are reviewed at length today  CHA2DS2-VASc Score = 7  The patient's score is based upon: CHF History: 0 HTN History: 1 Diabetes History: 0 Stroke History: 2 (ischemic enteritis and splenic infarct (embolic)) Vascular Disease History: 1 (aortic atherosclerosis) Age Score: 2 Gender Score: 1       ASSESSMENT AND PLAN: 1. Persistent Atrial Fibrillation (ICD10:   I48.19) The patient's CHA2DS2-VASc score is 7, indicating a 11.2% annual risk of stroke.   S/p afib ablation 06/06/22 Patient back in persistent afib. We discussed rhythm control options today, will plan for DCCV. Check bmet/cbc today. Continue Eliquis 5 mg BID Continue Lopressor 100 mg BID  2. Secondary Hypercoagulable State (ICD10:  D68.69) The patient is at significant risk for stroke/thromboembolism based upon her CHA2DS2-VASc Score of 7.  Continue Apixaban (Eliquis).   3. HTN Mildly elevated today, will reassess in SR.    Follow up in the AF clinic post DCCV as scheduled.    [name] PA-C Afib Clinic [facility] [address] [place], [place] [address] [phone] 06/24/2022 3:23 PM

## 2022-06-24 NOTE — Patient Instructions (Signed)
Cardioversion scheduled for: Tuesday, December 19th   - Arrive at the [facility] and go to admitting at 7am   - Do not eat or drink anything after midnight the night prior to your procedure.   - Take all your morning medication (except diabetic medications) with a sip of water prior to arrival. - You will not be able to drive home after your procedure.    - Do NOT miss any doses of your blood thinner - if you should miss a dose please notify our office immediately.   - If you feel as if you go back into normal rhythm prior to scheduled cardioversion, please notify our office immediately.  If your procedure is canceled in the cardioversion suite you will be charged a cancellation fee.

## 2022-07-01 ENCOUNTER — Encounter (HOSPITAL_COMMUNITY)
Admission: RE | Disposition: A | Discharge status: Home / Self Care | Payer: Self-pay | Source: Home / Self Care | Attending: Cardiology

## 2022-07-01 ENCOUNTER — Other Ambulatory Visit: Payer: Self-pay

## 2022-07-01 ENCOUNTER — Ambulatory Visit (HOSPITAL_COMMUNITY)
Admission: RE | Admit: 2022-07-01 | Discharge: 2022-07-01 | Disposition: A | Discharge status: Home / Self Care | Payer: Medicare Other | Attending: Cardiology | Admitting: Cardiology

## 2022-07-01 ENCOUNTER — Encounter (HOSPITAL_COMMUNITY): Payer: Self-pay | Admitting: Cardiology

## 2022-07-01 ENCOUNTER — Ambulatory Visit (HOSPITAL_BASED_OUTPATIENT_CLINIC_OR_DEPARTMENT_OTHER): Payer: Medicare Other | Admitting: Certified Registered"

## 2022-07-01 ENCOUNTER — Ambulatory Visit (HOSPITAL_COMMUNITY): Payer: Medicare Other | Admitting: Certified Registered"

## 2022-07-01 DIAGNOSIS — I4819 Other persistent atrial fibrillation: Secondary | ICD-10-CM | POA: Diagnosis not present

## 2022-07-01 DIAGNOSIS — Z7901 Long term (current) use of anticoagulants: Secondary | ICD-10-CM | POA: Diagnosis not present

## 2022-07-01 DIAGNOSIS — Z79899 Other long term (current) drug therapy: Secondary | ICD-10-CM | POA: Insufficient documentation

## 2022-07-01 DIAGNOSIS — I10 Essential (primary) hypertension: Secondary | ICD-10-CM | POA: Insufficient documentation

## 2022-07-01 DIAGNOSIS — D6869 Other thrombophilia: Secondary | ICD-10-CM | POA: Insufficient documentation

## 2022-07-01 DIAGNOSIS — I4891 Unspecified atrial fibrillation: Secondary | ICD-10-CM

## 2022-07-01 DIAGNOSIS — Z87891 Personal history of nicotine dependence: Secondary | ICD-10-CM | POA: Insufficient documentation

## 2022-07-01 DIAGNOSIS — I110 Hypertensive heart disease with heart failure: Secondary | ICD-10-CM | POA: Diagnosis not present

## 2022-07-01 HISTORY — PX: CARDIOVERSION: SHX1299

## 2022-07-01 SURGERY — CARDIOVERSION
Anesthesia: General

## 2022-07-01 MED ORDER — PROPOFOL 10 MG/ML IV BOLUS
INTRAVENOUS | Status: DC | PRN
  Administered 2022-07-01: 60 mg via INTRAVENOUS

## 2022-07-01 MED ORDER — SODIUM CHLORIDE 0.9 % IV SOLN: INTRAVENOUS | Status: DC

## 2022-07-01 MED ORDER — LIDOCAINE HCL (CARDIAC) PF 100 MG/5ML IV SOSY
PREFILLED_SYRINGE | INTRAVENOUS | Status: DC | PRN
  Administered 2022-07-01: 60 mg via INTRAVENOUS

## 2022-07-01 NOTE — Transfer of Care (Signed)
Immediate Anesthesia Transfer of Care Note  Patient: Shelly Maldonado  Procedure(s) Performed: CARDIOVERSION  Patient Location: PACU  Anesthesia Type:General  Level of Consciousness: drowsy  Airway & Oxygen Therapy: Patient Spontanous Breathing and Patient connected to nasal cannula oxygen  Post-op Assessment: Report given to RN and Post -op Vital signs reviewed and stable  Post vital signs: Reviewed and stable  Last Vitals:  Vitals Value Taken Time  BP    Temp    Pulse    Resp    SpO2      Last Pain: There were no vitals filed for this visit.       Complications: No notable events documented.

## 2022-07-01 NOTE — Anesthesia Preprocedure Evaluation (Signed)
Anesthesia Evaluation  Patient identified by MRN, date of birth, ID band Patient awake    Reviewed: Allergy & Precautions, H&P , NPO status , Patient's Chart, lab work & pertinent test results  Airway Mallampati: II  TM Distance: >3 FB Neck ROM: Full    Dental no notable dental hx.    Pulmonary neg pulmonary ROS, former smoker   Pulmonary exam normal breath sounds clear to auscultation       Cardiovascular hypertension, + dysrhythmias Atrial Fibrillation  Rhythm:Irregular Rate:Normal     Neuro/Psych negative neurological ROS  negative psych ROS   GI/Hepatic negative GI ROS, Neg liver ROS,,,  Endo/Other  negative endocrine ROS    Renal/GU negative Renal ROS  negative genitourinary   Musculoskeletal negative musculoskeletal ROS (+)    Abdominal   Peds negative pediatric ROS (+)  Hematology negative hematology ROS (+)   Anesthesia Other Findings   Reproductive/Obstetrics negative OB ROS                             Anesthesia Physical Anesthesia Plan  ASA: 3  Anesthesia Plan: General   Post-op Pain Management: Minimal or no pain anticipated   Induction: Intravenous  PONV Risk Score and Plan: Treatment may vary due to age or medical condition  Airway Management Planned: Mask  Additional Equipment:   Intra-op Plan:   Post-operative Plan:   Informed Consent: I have reviewed the patients History and Physical, chart, labs and discussed the procedure including the risks, benefits and alternatives for the proposed anesthesia with the patient or authorized representative who has indicated his/her understanding and acceptance.     Dental advisory given  Plan Discussed with: CRNA and Surgeon  Anesthesia Plan Comments:        Anesthesia Quick Evaluation

## 2022-07-01 NOTE — CV Procedure (Signed)
Procedure:   DCCV  Indication:  Symptomatic atrial fibrillation  Procedure Note:  The patient signed informed consent.  They have had had therapeutic anticoagulation with apixaban greater than 3 weeks.  Anesthesia was administered by Dr. Okey Dupre.  Patient received 60 mg IV lidocaine and 60 mg IV propofol.Adequate airway was maintained throughout and vital followed per protocol.  They were cardioverted x 2 with 120, 150J of biphasic synchronized energy.  They converted to sinus bradycardia with PACs.  There were no apparent complications.  The patient had normal neuro status and respiratory status post procedure with vitals stable as recorded elsewhere.    Follow up:  They will continue on current medical therapy and follow up with cardiology as scheduled.  Jodelle Red, MD PhD 07/01/2022 7:49 AM

## 2022-07-01 NOTE — Discharge Instructions (Signed)

## 2022-07-01 NOTE — Interval H&P Note (Signed)
History and Physical Interval Note:  07/01/2022 7:38 AM  Shelly Maldonado  has presented today for surgery, with the diagnosis of AFIB.  The various methods of treatment have been discussed with the patient and family. After consideration of risks, benefits and other options for treatment, the patient has consented to  Procedure(s): CARDIOVERSION (N/A) as a surgical intervention.  The patient's history has been reviewed, patient examined, no change in status, stable for surgery.  I have reviewed the patient's chart and labs.  Questions were answered to the patient's satisfaction.     Kiyana Vazguez Cristal Deer

## 2022-07-01 NOTE — Anesthesia Postprocedure Evaluation (Signed)
Anesthesia Post Note  Patient: Shelly Maldonado  Procedure(s) Performed: CARDIOVERSION     Patient location during evaluation: PACU Anesthesia Type: General Level of consciousness: awake and alert Pain management: pain level controlled Vital Signs Assessment: post-procedure vital signs reviewed and stable Respiratory status: spontaneous breathing, nonlabored ventilation, respiratory function stable and patient connected to nasal cannula oxygen Cardiovascular status: blood pressure returned to baseline and stable Postop Assessment: no apparent nausea or vomiting Anesthetic complications: no  No notable events documented.  Last Vitals:  Vitals:   07/01/22 0751 07/01/22 0800  BP: 128/69 109/86  Pulse: (!) 53 (!) 53  Resp: (!) 21 (!) 23  SpO2: 95% 96%    Last Pain:  Vitals:   07/01/22 0800  PainSc: 0-No pain                 Jayon Matton S

## 2022-07-07 ENCOUNTER — Encounter (HOSPITAL_COMMUNITY): Payer: Self-pay | Admitting: Cardiology

## 2022-07-08 ENCOUNTER — Encounter (HOSPITAL_COMMUNITY): Payer: Self-pay | Admitting: Physician Assistant

## 2022-07-08 ENCOUNTER — Ambulatory Visit (HOSPITAL_COMMUNITY)
Admission: RE | Admit: 2022-07-08 | Discharge: 2022-07-08 | Disposition: A | Discharge status: Home / Self Care | Payer: Medicare Other | Source: Ambulatory Visit | Attending: Physician Assistant | Admitting: Physician Assistant

## 2022-07-08 VITALS — BP 136/80 | HR 49 | Ht 64.5 in | Wt 167.8 lb

## 2022-07-08 DIAGNOSIS — I10 Essential (primary) hypertension: Secondary | ICD-10-CM | POA: Insufficient documentation

## 2022-07-08 DIAGNOSIS — D6869 Other thrombophilia: Secondary | ICD-10-CM | POA: Insufficient documentation

## 2022-07-08 DIAGNOSIS — I700 Atherosclerosis of aorta: Secondary | ICD-10-CM | POA: Diagnosis not present

## 2022-07-08 DIAGNOSIS — I4819 Other persistent atrial fibrillation: Secondary | ICD-10-CM | POA: Insufficient documentation

## 2022-07-08 DIAGNOSIS — Z7901 Long term (current) use of anticoagulants: Secondary | ICD-10-CM | POA: Insufficient documentation

## 2022-07-08 DIAGNOSIS — Z79899 Other long term (current) drug therapy: Secondary | ICD-10-CM | POA: Diagnosis not present

## 2022-07-08 LAB — CBC
HCT: 43.8 % (ref 36.0–46.0)
Hemoglobin: 14.4 g/dL (ref 12.0–15.0)
MCH: 33.1 pg (ref 26.0–34.0)
MCHC: 32.9 g/dL (ref 30.0–36.0)
MCV: 100.7 fL — ABNORMAL HIGH (ref 80.0–100.0)
Platelets: 345 10*3/uL (ref 150–400)
RBC: 4.35 MIL/uL (ref 3.87–5.11)
RDW: 12.2 % (ref 11.5–15.5)
WBC: 7.4 10*3/uL (ref 4.0–10.5)
nRBC: 0 % (ref 0.0–0.2)

## 2022-07-08 NOTE — Progress Notes (Signed)
Primary Care Physician: Neale Burly, MD Primary Cardiologist: Dr Johney Frame Primary Electrophysiologist: Dr Quentin Ore Referring Physician: Dr Johney Frame   Shelly Maldonado is a 76 y.o. female with a history of HTN, aortic atherosclerosis, chronic pancreatitis, ischemic enteritis, atrial fibrillation who presents for follow up in the [facility].  The patient was initially diagnosed with atrial fibrillation during hospitalization in [place] from 10/11/21-10/16/21. She was started in Eliquis and metoprolol at that time. 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 Embolic event not felt to be an Eliquis failure as she has just started the medication. Patient has a CHADS2VASC score of 6. Plan was for TEE/DCCV after 3 weeks of anticoagulation. She was admitted for observation 11/08/21 with SOB and orthopnea, found to have bilateral pleural effusions, was diuresed. Seen by Ermalinda Barrios on 11/18/21 and her BB was increased. She has monitored her heart rates since then which have been elevated 100-120 bpm. Her BB was further increased.   Patient is s/p DCCV on 01/02/22. TEE was not performed. Unfortunately, she went back out of rhythm on on 01/09/22 with symptoms of intermittent lightheadedness and fatigue. She was seen by Dr Quentin Ore and underwent afib ablation 06/06/22.  Patient reports that immediately following her ablation she felt "the best she has in a long time". However, she started noticing increased fatigue and SOB with exertion and intermittent dizziness. She borrowed her sister's smart watch which showed afib, confirmed on ECG.   On follow up today, patient is s/p DCCV on 07/01/22. She reports that 12/21-12/23 she felt she was back in afib with fatigue and SOB but she converted again spontaneously. She states that she feels "even better today than I did after the ablation."  Today, she denies  symptoms of palpitations, orthopnea, PND, lower extremity edema, syncope, snoring, daytime somnolence, bleeding, or neurologic sequela. The patient is tolerating medications without difficulties and is otherwise without complaint today.    Atrial Fibrillation Risk Factors:  she does not have symptoms or diagnosis of sleep apnea. she does not have a history of rheumatic fever. she does not have a history of alcohol use. The patient does not have a history of early familial atrial fibrillation or other arrhythmias.  she has a BMI of Body mass index is 28.36 kg/m.Marland Kitchen Filed Weights   07/08/22 1022  Weight: 76.1 kg     Family History  Problem Relation Age of Onset   Cancer Mother    Heart attack Father    Colon cancer Neg Hx      Atrial Fibrillation Management history:  Previous antiarrhythmic drugs: none Previous cardioversions: 01/02/22, 07/01/22 Previous ablations: 06/06/22 CHADS2VASC score: 6 Anticoagulation history: Eliquis   Past Medical History:  Diagnosis Date   Atrial fibrillation ([place])    Hypertension    Pancreatitis    Shingles    Past Surgical History:  Procedure Laterality Date   ATRIAL FIBRILLATION ABLATION N/A 06/06/2022   Procedure: ATRIAL FIBRILLATION ABLATION;  Surgeon: Vickie Epley, MD;  Location: [facility];  Service: Cardiovascular;  Laterality: N/A;   CARDIOVERSION N/A 01/02/2022   Procedure: CARDIOVERSION;  Surgeon: Pixie Casino, MD;  Location: [facility] ENDOSCOPY;  Service: Cardiovascular;  Laterality: N/A;   CARDIOVERSION N/A 07/01/2022   Procedure: CARDIOVERSION;  Surgeon: Buford Dresser, MD;  Location: [facility] ENDOSCOPY;  Service: Cardiovascular;  Laterality: N/A;   CATARACT EXTRACTION Bilateral 2016   CHOLECYSTECTOMY     COLONOSCOPY  N/A 03/04/2018   Procedure: COLONOSCOPY;  Surgeon: Malissa Hippo, MD;  Location: [facility]O [facility];  Service: Endoscopy;  Laterality: N/A;  12:45   complete hysterecomy      Current Outpatient  Medications  Medication Sig Dispense Refill   acetaminophen (TYLENOL) 500 MG tablet Take 500 mg by mouth every 6 (six) hours as needed for moderate pain or headache.     ELIQUIS 5 MG TABS tablet Take 1 tablet (5 mg total) by mouth 2 (two) times daily. 60 tablet 5   meclizine (ANTIVERT) 12.5 MG tablet Take 12.5 mg by mouth 3 (three) times daily as needed for dizziness.     metoprolol tartrate (LOPRESSOR) 100 MG tablet Take 1 tablet (100 mg total) by mouth 2 (two) times daily. 180 tablet 3   metroNIDAZOLE (METROGEL) 1 % gel Apply 1 Application topically daily as needed (rosacea).     pantoprazole (PROTONIX) 40 MG tablet Take 1 tablet (40 mg total) by mouth daily. 45 tablet 0   No current facility-administered medications for this encounter.    Allergies  Allergen Reactions   Codeine Other (See Comments)    Unknown reaction, tolerates hydrocodone    Morphine And Related     Vein became red    Social History   Socioeconomic History   Marital status: Divorced    Spouse name: Not on file   Number of children: Not on file   Years of education: Not on file   Highest education level: Not on file  Occupational History   Not on file  Tobacco Use   Smoking status: Former   Smokeless tobacco: Never   Tobacco comments:    Former smoker 12/25/21 smoked 48 yrs ago.   Vaping Use   Vaping Use: Never used  Substance and Sexual Activity   Alcohol use: Never   Drug use: Never   Sexual activity: Not on file  Other Topics Concern   Not on file  Social History Narrative   Not on file   Social Determinants of Health   Financial Resource Strain: Not on file  Food Insecurity: Not on file  Transportation Needs: Not on file  Physical Activity: Not on file  Stress: Not on file  Social Connections: Not on file  Intimate Partner Violence: Not on file     ROS- All systems are reviewed and negative except as per the HPI above.  Physical Exam: Vitals:   07/08/22 1022  BP: 136/80  Pulse:  (!) 49  Weight: 76.1 kg  Height: 5' 4.5" (1.638 m)     GEN- The patient is a well appearing elderly female, alert and oriented x 3 today.   HEENT-head normocephalic, atraumatic, sclera clear, conjunctiva pink, hearing intact, trachea midline. Lungs- Clear to ausculation bilaterally, normal work of breathing Heart- Regular rate and rhythm, bradycardia, no murmurs, rubs or gallops  GI- soft, NT, ND, + BS Extremities- no clubbing, cyanosis, or edema MS- no significant deformity or atrophy Skin- no rash or lesion Psych- euthymic mood, full affect Neuro- strength and sensation are intact   Wt Readings from Last 3 Encounters:  07/08/22 76.1 kg  07/01/22 81.2 kg  06/24/22 76.7 kg    EKG today demonstrates  SB Vent. rate 49 BPM PR interval 152 ms QRS duration 76 ms QT/QTcB 472/426 ms  Echo 10/22/21 demonstrated   1. Left ventricular ejection fraction, by estimation, is 60 to 65%. The  left ventricle has normal function. The left ventricle has no regional  wall motion  abnormalities. There is mild left ventricular hypertrophy of  the basal-septal segment. Left ventricular diastolic function could not be evaluated.   2. Right ventricular systolic function is normal. The right ventricular  size is normal. There is moderately elevated pulmonary artery systolic  pressure. The estimated right ventricular systolic pressure is [id number] mmHg.   3. The pericardial effusion is anterior to the right ventricle.   4. The mitral valve is normal in structure. Trivial mitral valve  regurgitation. No evidence of mitral stenosis.   5. The aortic valve is normal in structure. Aortic valve regurgitation is  not visualized. No aortic stenosis is present.   6. The inferior vena cava is normal in size with greater than 50%  respiratory variability, suggesting right atrial pressure of 3 mmHg.   Epic records are reviewed at length today  CHA2DS2-VASc Score = 7  The patient's score is based upon: CHF  History: 0 HTN History: 1 Diabetes History: 0 Stroke History: 2 (ischemic enteritis and splenic infarct (embolic)) Vascular Disease History: 1 (aortic atherosclerosis) Age Score: 2 Gender Score: 1       ASSESSMENT AND PLAN: 1. Persistent Atrial Fibrillation (ICD10:  I48.19) The patient's CHA2DS2-VASc score is 7, indicating a 11.2% annual risk of stroke.   S/p afib ablation 06/06/22 S/p DCCV 07/01/22 Patient back in SR. Continue Eliquis 5 mg BID Continue Lopressor 100 mg BID  2. Secondary Hypercoagulable State (ICD10:  D68.69) The patient is at significant risk for stroke/thromboembolism based upon her CHA2DS2-VASc Score of 7.  Continue Apixaban (Eliquis).   3. HTN Stable, no changes today.   Follow up with Dr Quentin Ore as scheduled.    [facility] [address] [place], [place] [address] [phone] 07/08/2022 10:31 AM

## 2022-07-17 ENCOUNTER — Other Ambulatory Visit: Payer: Self-pay | Admitting: Physician Assistant

## 2022-08-29 ENCOUNTER — Other Ambulatory Visit (HOSPITAL_COMMUNITY): Payer: Self-pay | Admitting: *Deleted

## 2022-08-29 DIAGNOSIS — I482 Chronic atrial fibrillation, unspecified: Secondary | ICD-10-CM

## 2022-08-29 MED ORDER — ELIQUIS 5 MG PO TABS: 5.0000 mg | ORAL_TABLET | Freq: Two times a day (BID) | ORAL | 6 refills | Status: DC

## 2022-09-02 ENCOUNTER — Other Ambulatory Visit (HOSPITAL_COMMUNITY): Payer: Self-pay

## 2022-09-08 NOTE — Progress Notes (Unsigned)
  Electrophysiology Office Follow up Visit Note:    Date:  09/08/2022   ID:  Shelly Maldonado, DOB 16-Feb-1946, MRN [id number]  PCP:  Neale Burly, MD  [facility] Cardiologist:  Freada Bergeron, MD  [facility] Electrophysiologist:  Vickie Epley, MD    Interval History:    Shelly Maldonado is a 77 y.o. female who presents for a follow up visit.   She underwent catheter ablation of atrial fibrillation in November 2023.  During that procedure the veins and posterior wall were isolated.  She did require cardioversion on July 01, 2022.  She saw Audry Pili in the A-fib clinic July 08, 2022.  At that appointment she was doing well maintaining sinus rhythm.  She is on Eliquis for stroke prophylaxis.      Past medical, surgical, social and family history were reviewed.  ROS:   Please see the history of present illness.    All other systems reviewed and are negative.  EKGs/Labs/Other Studies Reviewed:    The following studies were reviewed today:    EKG:  The ekg ordered today demonstrates ***   Physical Exam:    VS:  There were no vitals taken for this visit.    Wt Readings from Last 3 Encounters:  07/08/22 167 lb 12.8 oz (76.1 kg)  07/01/22 179 lb (81.2 kg)  06/24/22 169 lb (76.7 kg)     GEN: *** Well nourished, well developed in no acute distress CARDIAC: ***RRR, no murmurs, rubs, gallops RESPIRATORY:  Clear to auscultation without rales, wheezing or rhonchi       ASSESSMENT:    1. Persistent atrial fibrillation ([place])   2. Essential hypertension    PLAN:    In order of problems listed above:   #Persistent atrial fibrillation Post catheter ablation on June 06, 2022.  Maintaining sinus rhythm.  Continue Eliquis 5 mg by mouth twice daily for stroke prophylaxis.  #Hypertension *** goal today.  Recommend checking blood pressures 1-2 times per week at home and recording the values.  Recommend bringing these recordings to the primary care  physician.      Signed, Lars Mage, MD, [facility], [facility] 09/08/2022 9:41 PM    Electrophysiology [facility]

## 2022-09-09 ENCOUNTER — Ambulatory Visit: Payer: Medicare Other | Attending: Cardiology | Admitting: Cardiology

## 2022-09-09 ENCOUNTER — Encounter: Payer: Self-pay | Admitting: Cardiology

## 2022-09-09 ENCOUNTER — Telehealth: Payer: Self-pay | Admitting: *Deleted

## 2022-09-09 VITALS — BP 128/88 | HR 58 | Ht 64.5 in | Wt 169.0 lb

## 2022-09-09 DIAGNOSIS — I4819 Other persistent atrial fibrillation: Secondary | ICD-10-CM | POA: Insufficient documentation

## 2022-09-09 DIAGNOSIS — I10 Essential (primary) hypertension: Secondary | ICD-10-CM | POA: Diagnosis not present

## 2022-09-09 NOTE — Progress Notes (Signed)
Electrophysiology Office Follow up Visit Note:    Date:  09/09/2022   ID:  Griffin Takata, DOB 09/26/1945, MRN [id number]  PCP:  Neale Burly, MD  [facility] Cardiologist:  Freada Bergeron, MD  [facility] Electrophysiologist:  Vickie Epley, MD    Interval History:    Shelly Maldonado is a 77 y.o. female who presents for a follow up visit.   She underwent catheter ablation of atrial fibrillation in November 2023.  During that procedure the veins and posterior wall were isolated.  She did require cardioversion on July 01, 2022.  She saw Audry Pili in the A-fib clinic July 08, 2022.  At that appointment she was doing well maintaining sinus rhythm.  She is on Eliquis for stroke prophylaxis.  Today, she is feeling much better. Her breathing has improved overall. She sometimes has difficulty breathing at night or when she wakes up. At one time, she woke up in the morning and her whole body was shaking with strong heart beats. She is not certain if she snores, and has not had a prior sleep study. Usually she sleeps for only a few hours at a time in intermittent cycles.   She has been monitoring her heart rhythm at home with her [employer].  Her blood pressure is elevated in clinic at 156/90 which she attributes to driving through heavy traffic. Her BP normalized on recheck.  She denies any chest pain, shortness of breath, or peripheral edema. No lightheadedness, headaches, syncope, orthopnea, or PND.     Past medical, surgical, social and family history were reviewed.  ROS:   Please see the history of present illness.    (+) Palpitations (+) Insomnia All other systems reviewed and are negative.  EKGs/Labs/Other Studies Reviewed:    The following studies were reviewed today:   EKG:  The ekg ordered today demonstrates sinus rhythm   Physical Exam:    VS:  BP 128/88   Pulse (!) 58   Ht 5' 4.5" (1.638 m)   Wt 169 lb (76.7 kg)   SpO2 98%   BMI 28.56 kg/m      Wt Readings from Last 3 Encounters:  09/09/22 169 lb (76.7 kg)  07/08/22 167 lb 12.8 oz (76.1 kg)  07/01/22 179 lb (81.2 kg)     GEN:  Well nourished, well developed in no acute distress CARDIAC: RRR, no murmurs, rubs, gallops RESPIRATORY:  Clear to auscultation without rales, wheezing or rhonchi       ASSESSMENT:    1. Persistent atrial fibrillation ([place])   2. Essential hypertension   3. Primary hypertension    PLAN:    In order of problems listed above:   #Persistent atrial fibrillation Post catheter ablation on June 06, 2022.  Maintaining sinus rhythm.  Continue Eliquis 5 mg by mouth twice daily for stroke prophylaxis.  #Hypertension Initially above goal but at goal on recheck.  Recommend checking blood pressures 1-2 times per week at home and recording the values.  Recommend bringing these recordings to the primary care physician.  #Sleep disordered breathing Frequent interruptions overnight for unclear reasons.  Fatigue.  Today.  Unclear if she snores.  She does report arrhythmias that are frequently triggered when she wakes up at night or goes to sleep at night.  I am concerned that she may be having obstructive sleep apnea which would be contributing to her arrhythmias.  I would like her to have an itamar home sleep study  Follow-up with A-fib clinic in 6  months.  I,Mathew Stumpf,acting as a Education administrator for Vickie Epley, MD.,have documented all relevant documentation on the behalf of Vickie Epley, MD,as directed by  Vickie Epley, MD while in the presence of Vickie Epley, MD.  I, Vickie Epley, MD, have reviewed all documentation for this visit. The documentation on 09/09/22 for the exam, diagnosis, procedures, and orders are all accurate and complete.   Signed, Lars Mage, MD, [facility], [facility] 09/09/2022 9:42 AM    Electrophysiology [facility]

## 2022-09-09 NOTE — Assessment & Plan Note (Signed)
Post catheter ablation on June 06, 2022.  Maintaining sinus rhythm.  Continue Eliquis 5 mg by mouth twice daily for stroke prophylaxis.

## 2022-09-09 NOTE — Assessment & Plan Note (Signed)
Above goal today.  Recommend checking blood pressures 1-2 times per week at home and recording the values.  Recommend bringing these recordings to the primary care physician.

## 2022-09-09 NOTE — Patient Instructions (Signed)
Medication Instructions:  Your physician recommends that you continue on your current medications as directed. Please refer to the Current Medication list given to you today.  *If you need a refill on your cardiac medications before your next appointment, please call your pharmacy*  Testing/Procedures: Your physician has recommended that you have a sleep study. This test records several body functions during sleep, including: brain activity, eye movement, oxygen and carbon dioxide blood levels, heart rate and rhythm, breathing rate and rhythm, the flow of air through your mouth and nose, snoring, body muscle movements, and chest and belly movement.  Follow-Up: At [facility], you and your health needs are our priority.  As part of our continuing mission to provide you with exceptional heart care, we have created designated Provider Care Teams.  These Care Teams include your primary Cardiologist (physician) and Advanced Practice Providers (APPs -  Physician Assistants and Nurse Practitioners) who all work together to provide you with the care you need, when you need it.  Your next appointment:   6 month(s)  Provider:   You will follow up in the [facility] located at [facility]. Your provider will be: Clint R. Fenton, PA-C

## 2022-09-09 NOTE — Telephone Encounter (Signed)
Dr. Quentin Ore ordered an Shelly Maldonado study today while the pt was in the office. Pt agreeable to signed waiver and to not open the box until she has been called with the PIN#.

## 2022-09-12 NOTE — Telephone Encounter (Signed)
Left detailed message pt has been approved for It[facility] and ok to proceed with sleep study. Left message with the PIN# [id number]. Left message if pt could do sleep study between tonight and early next week. If any questions please call [phone] and ask to s/w [facility] sleep studies.

## 2022-09-12 NOTE — Telephone Encounter (Signed)
Prior Authorization for [facility] sent to MEDICARE via web portal. Tracking Number .  READY-[id number] PA REQ

## 2022-10-29 ENCOUNTER — Inpatient Hospital Stay: Payer: Medicare Other | Attending: Hematology

## 2022-10-29 DIAGNOSIS — D735 Infarction of spleen: Secondary | ICD-10-CM | POA: Diagnosis not present

## 2022-10-29 LAB — D-DIMER, QUANTITATIVE: D-Dimer, Quant: 0.31 ug/mL-FEU (ref 0.00–0.50)

## 2022-11-05 ENCOUNTER — Inpatient Hospital Stay: Payer: Medicare Other | Admitting: Hematology

## 2022-11-06 DIAGNOSIS — B029 Zoster without complications: Secondary | ICD-10-CM | POA: Diagnosis not present

## 2022-11-06 DIAGNOSIS — R21 Rash and other nonspecific skin eruption: Secondary | ICD-10-CM | POA: Diagnosis not present

## 2022-11-11 ENCOUNTER — Ambulatory Visit: Payer: Medicare Other | Admitting: Student

## 2022-11-25 ENCOUNTER — Inpatient Hospital Stay: Payer: Medicare Other | Admitting: Physician Assistant

## 2022-11-27 ENCOUNTER — Inpatient Hospital Stay: Payer: Medicare Other | Attending: Hematology | Admitting: Physician Assistant

## 2022-11-27 ENCOUNTER — Encounter: Payer: Self-pay | Admitting: Physician Assistant

## 2022-11-27 VITALS — BP 157/85 | HR 54 | Temp 98.4°F | Resp 18 | Wt 176.0 lb

## 2022-11-27 DIAGNOSIS — Z86718 Personal history of other venous thrombosis and embolism: Secondary | ICD-10-CM | POA: Insufficient documentation

## 2022-11-27 DIAGNOSIS — K529 Noninfective gastroenteritis and colitis, unspecified: Secondary | ICD-10-CM | POA: Diagnosis not present

## 2022-11-27 DIAGNOSIS — Z7901 Long term (current) use of anticoagulants: Secondary | ICD-10-CM | POA: Insufficient documentation

## 2022-11-27 DIAGNOSIS — D735 Infarction of spleen: Secondary | ICD-10-CM

## 2022-11-27 DIAGNOSIS — K55069 Acute infarction of intestine, part and extent unspecified: Secondary | ICD-10-CM

## 2022-11-27 NOTE — Progress Notes (Signed)
[facility] [address] [place], [place] [address]   CLINIC:  Medical Oncology/Hematology  PCP:  Toma Deiters, MD 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 Brakefield 77 y.o. 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  She was last seen by Dr. Ellin Saba on 05/06/2022.  At today's visit, she reports feeling fairly well, apart from recovering from recently diagnosed shingles.  No recent hospitalizations, surgeries, or changes in baseline health status.  She remains on Eliquis and is tolerating this very well.  She denies any bleeding issues.   She denies any abdominal pain, nausea, or vomiting.   No other interval VTE, or current symptoms of DVT or PE.    She has chronic fatigue with very low energy, but has 100% appetite. She endorses that she is maintaining a stable weight.   ASSESSMENT & PLAN:  1.  Thromboembolism of SMA: - Admission to [facility] from 10/11/2021 through 10/16/2021 with UTI, diagnosed with A-fib and started on Eliquis. - Admitted to [facility] from 10/21/2021 - 10/26/2021 for weakness and diarrhea. - CT AP with contrast (10/21/2021): Abnormal bowel wall thickening of multiple loops of jejunum, suspected acute emboli within 2 proximal jejunal branches of SMA.  Both branches appear to have some contrast extending distal to the emboli which may indicate lack of total occlusion or collateralization.  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  Appearance raises concern for central source of emboli.  There is some moderate abdominal aortic atherosclerotic calcification although relatively little calcified atherosclerosis in the SMA itself. - Hypercoagulable testing (10/22/2021): LA negative, anticardiolipin antibody negative, antibeta-2  glycoprotein antibodies negative, factor V Leiden and prothrombin gene mutation negative  - She is being continued on Eliquis, which she is tolerating very well.  Denies any abdominal pain today.  No prior history of miscarriages.  No prior DVT/PE. - She does not report any abdominal pain at this time.  No interval VTE or symptoms of acute VTE. - Most recent labs (10/29/2022) show normal D-dimer 0.31 - PLAN: Continue annual follow-up for ongoing anticoagulation (Eliquis). - Indefinite Eliquis recommended in the setting of atrial fibrillation and history of thromboembolism of SMA with splenic infarct.  2.  Chronic diarrhea: - She had diarrhea since chronic pancreatitis / pancreatic insufficiency diagnosed in 2019 - She stopped taking Creon and diarrhea has improved.  3.  Social/family history: - She lives by herself at home and is independent of ADLs and IADLs.  Sister lives close by.  She is a retired Public relations account executive.  Quit smoking 45 years ago.  No alcohol use. - No family history of miscarriages or lupus anticoagulant.  Mother had non-Hodgkin's lymphoma.  Maternal grandmother had stomach cancer.  3 maternal uncles had cancer.  PLAN SUMMARY: >> Labs in 1 year = CBC/D, BMP, D-dimer >> OFFICE visit after labs     REVIEW OF SYSTEMS:   Review of Systems  Constitutional:  Positive for fatigue. Negative for appetite change, chills, diaphoresis, fever and unexpected weight change.  HENT:   Negative for lump/mass and nosebleeds.   Eyes:  Negative for eye problems.  Respiratory:  Negative for cough, hemoptysis and shortness of breath.   Cardiovascular:  Negative for chest pain, leg swelling and palpitations.  Gastrointestinal:  Positive for diarrhea.  Negative for abdominal pain, blood in stool, constipation, nausea and vomiting.  Genitourinary:  Negative for hematuria.   Skin:  Positive for rash (shingles).  Neurological:  Positive for dizziness and numbness. Negative for headaches and  light-headedness.  Hematological:  Does not bruise/bleed easily.     PHYSICAL EXAM:  ECOG PERFORMANCE STATUS: 1 - Symptomatic but completely ambulatory  Vitals:   11/27/22 1435  BP: (!) 157/85  Pulse: (!) 54  Resp: 18  Temp: 98.4 F (36.9 C)  SpO2: 98%   Filed Weights   11/27/22 1435  Weight: 176 lb (79.8 kg)   Physical Exam Constitutional:      Appearance: Normal appearance. She is obese.  Cardiovascular:     Heart sounds: Normal heart sounds.  Pulmonary:     Breath sounds: Normal breath sounds.  Neurological:     General: No focal deficit present.     Mental Status: Mental status is at baseline.  Psychiatric:        Behavior: Behavior normal. Behavior is cooperative.     PAST MEDICAL/SURGICAL HISTORY:  Past Medical History:  Diagnosis Date   Atrial fibrillation (HCC)    Hypertension    Pancreatitis    Shingles    Past Surgical History:  Procedure Laterality Date   ATRIAL FIBRILLATION ABLATION N/A 06/06/2022   Procedure: ATRIAL FIBRILLATION ABLATION;  Surgeon: Lanier Prude, MD;  Location: [facility] INVASIVE CV LAB;  Service: Cardiovascular;  Laterality: N/A;   CARDIOVERSION N/A 01/02/2022   Procedure: CARDIOVERSION;  Surgeon: Chrystie Nose, MD;  Location: [facility] ENDOSCOPY;  Service: Cardiovascular;  Laterality: N/A;   CARDIOVERSION N/A 07/01/2022   Procedure: CARDIOVERSION;  Surgeon: Jodelle Red, MD;  Location: [facility] ENDOSCOPY;  Service: Cardiovascular;  Laterality: N/A;   CATARACT EXTRACTION Bilateral 2016   CHOLECYSTECTOMY     COLONOSCOPY N/A 03/04/2018   Procedure: COLONOSCOPY;  Surgeon: Malissa Hippo, MD;  Location: [facility] ENDO SUITE;  Service: Endoscopy;  Laterality: N/A;  12:45   complete hysterecomy      SOCIAL HISTORY:  Social History   Socioeconomic History   Marital status: Divorced    Spouse name: Not on file   Number of children: Not on file   Years of education: Not on file   Highest education level: Not on file  Occupational History    Not on file  Tobacco Use   Smoking status: Former   Smokeless tobacco: Never   Tobacco comments:    Former smoker 12/25/21 smoked 48 yrs ago.   Vaping Use   Vaping Use: Never used  Substance and Sexual Activity   Alcohol use: Never   Drug use: Never   Sexual activity: Not on file  Other Topics Concern   Not on file  Social History Narrative   Not on file   Social Determinants of Health   Financial Resource Strain: Not on file  Food Insecurity: Not on file  Transportation Needs: Not on file  Physical Activity: Not on file  Stress: Not on file  Social Connections: Not on file  Intimate Partner Violence: Not on file    FAMILY HISTORY:  Family History  Problem Relation Age of Onset   Cancer Mother    Heart attack Father    Colon cancer Neg Hx     CURRENT MEDICATIONS:  Outpatient Encounter Medications as of 11/27/2022  Medication Sig   acetaminophen (TYLENOL) 500 MG tablet Take 500 mg by mouth every 6 (six) hours as needed for moderate pain or headache.  ELIQUIS 5 MG TABS tablet Take 1 tablet (5 mg total) by mouth 2 (two) times daily.   meclizine (ANTIVERT) 12.5 MG tablet Take 12.5 mg by mouth 3 (three) times daily as needed for dizziness.   metoprolol tartrate (LOPRESSOR) 100 MG tablet Take 1 tablet (100 mg total) by mouth 2 (two) times daily.   metroNIDAZOLE (METROGEL) 1 % gel Apply 1 Application topically daily as needed (rosacea).   pantoprazole (PROTONIX) 40 MG tablet Take 1 tablet (40 mg total) by mouth daily.   valACYclovir (VALTREX) 1000 MG tablet Take 1,000 mg by mouth 3 (three) times daily.   No facility-administered encounter medications on file as of 11/27/2022.    ALLERGIES:  Allergies  Allergen Reactions   Codeine Other (See Comments)    Unknown reaction, tolerates hydrocodone    Morphine And Codeine     Vein became red    LABORATORY DATA:  I have reviewed the labs as listed.  CBC    Component Value Date/Time   WBC 7.4 07/08/2022 1016   RBC  4.35 07/08/2022 1016   HGB 14.4 07/08/2022 1016   HCT 43.8 07/08/2022 1016   PLT 345 07/08/2022 1016   MCV 100.7 (H) 07/08/2022 1016   MCH 33.1 07/08/2022 1016   MCHC 32.9 07/08/2022 1016   RDW 12.2 07/08/2022 1016   LYMPHSABS 1.5 06/03/2022 1209   MONOABS 0.8 06/03/2022 1209   EOSABS 0.2 06/03/2022 1209   BASOSABS 0.1 06/03/2022 1209      Latest Ref Rng & Units 06/24/2022    6:00 PM 05/16/2022   10:32 AM 04/29/2022    1:15 PM  CMP  Glucose 70 - 99 mg/dL 91  87  92   BUN 8 - 23 mg/dL 14  19  22    Creatinine 0.44 - 1.00 mg/dL 5.18  8.41  6.60   Sodium 135 - 145 mmol/L 138  142  140   Potassium 3.5 - 5.1 mmol/L 4.6  4.9  4.1   Chloride 98 - 111 mmol/L 104  103  107   CO2 22 - 32 mmol/L 23  24  24    Calcium 8.9 - 10.3 mg/dL 9.6  9.6  9.2   Total Protein 6.5 - 8.1 g/dL   7.2   Total Bilirubin 0.3 - 1.2 mg/dL   0.7   Alkaline Phos 38 - 126 U/L   71   AST 15 - 41 U/L   24   ALT 0 - 44 U/L   21     DIAGNOSTIC IMAGING:  I have independently reviewed the relevant imaging and discussed with the patient.   WRAP UP:  All questions were answered. The patient knows to call the clinic with any problems, questions or concerns.  Medical decision making: Low  Time spent on visit: I spent 15 minutes counseling the patient face to face. The total time spent in the appointment was 22 minutes and more than 50% was on counseling.  Carnella Guadalajara, PA-C  11/27/22 4:16 PM

## 2022-12-01 ENCOUNTER — Other Ambulatory Visit: Payer: Self-pay

## 2022-12-01 DIAGNOSIS — K55069 Acute infarction of intestine, part and extent unspecified: Secondary | ICD-10-CM

## 2022-12-15 ENCOUNTER — Telehealth: Payer: Self-pay | Admitting: Cardiology

## 2022-12-15 MED ORDER — METOPROLOL TARTRATE 100 MG PO TABS: 100.0000 mg | ORAL_TABLET | Freq: Two times a day (BID) | ORAL | 1 refills | Status: DC

## 2022-12-15 NOTE — Telephone Encounter (Signed)
This is a [place] pt.  °

## 2022-12-15 NOTE — Telephone Encounter (Signed)
Refill complete 

## 2022-12-15 NOTE — Telephone Encounter (Signed)
*  STAT* If patient is at the pharmacy, call can be transferred to refill team.   1. Which medications need to be refilled? (please list name of each medication and dose if known) metoprolol tartrate (LOPRESSOR) 100 MG tablet (Expired)   2. Which pharmacy/location (including street and city if local pharmacy) is medication to be sent to?  [id number] - [place], [place] - [address] OF [address]    3. Do they need a 30 day or 90 day supply? 90

## 2023-01-08 ENCOUNTER — Ambulatory Visit (HOSPITAL_COMMUNITY)
Admission: RE | Admit: 2023-01-08 | Discharge: 2023-01-08 | Disposition: A | Discharge status: Home / Self Care | Payer: Medicare Other | Source: Ambulatory Visit | Attending: Physician Assistant | Admitting: Physician Assistant

## 2023-01-08 ENCOUNTER — Encounter (HOSPITAL_COMMUNITY): Payer: Self-pay | Admitting: Physician Assistant

## 2023-01-08 VITALS — BP 106/68 | HR 45 | Ht 64.5 in | Wt 174.2 lb

## 2023-01-08 DIAGNOSIS — I700 Atherosclerosis of aorta: Secondary | ICD-10-CM | POA: Diagnosis not present

## 2023-01-08 DIAGNOSIS — K559 Vascular disorder of intestine, unspecified: Secondary | ICD-10-CM | POA: Insufficient documentation

## 2023-01-08 DIAGNOSIS — I4819 Other persistent atrial fibrillation: Secondary | ICD-10-CM | POA: Diagnosis not present

## 2023-01-08 DIAGNOSIS — D6869 Other thrombophilia: Secondary | ICD-10-CM | POA: Diagnosis not present

## 2023-01-08 DIAGNOSIS — K861 Other chronic pancreatitis: Secondary | ICD-10-CM | POA: Diagnosis not present

## 2023-01-08 DIAGNOSIS — R42 Dizziness and giddiness: Secondary | ICD-10-CM | POA: Diagnosis not present

## 2023-01-08 DIAGNOSIS — I10 Essential (primary) hypertension: Secondary | ICD-10-CM | POA: Insufficient documentation

## 2023-01-08 DIAGNOSIS — Z7901 Long term (current) use of anticoagulants: Secondary | ICD-10-CM | POA: Insufficient documentation

## 2023-01-08 DIAGNOSIS — Z79899 Other long term (current) drug therapy: Secondary | ICD-10-CM | POA: Insufficient documentation

## 2023-01-08 DIAGNOSIS — R001 Bradycardia, unspecified: Secondary | ICD-10-CM | POA: Insufficient documentation

## 2023-01-08 NOTE — Progress Notes (Signed)
Primary Care Physician: Toma Deiters, MD Primary Cardiologist: Dr Shari Prows Primary Electrophysiologist: Dr Lalla Brothers Referring Physician: Dr Shari Prows   Shelly Maldonado is a 77 y.o. female with a history of HTN, aortic atherosclerosis, chronic pancreatitis, ischemic enteritis, atrial fibrillation who presents for follow up in the [facility]rial Fibrillation [facility].  The patient was initially diagnosed with atrial fibrillation during hospitalization in [place] from 10/11/21-10/16/21. She was started in Eliquis and metoprolol at that time. She was admitted to [facility] 10/21/21 with ischemic enteritis with splenic infarct concerning for embolus as well as pneumonia. Embolic event not felt to be an Eliquis failure as she has just started the medication. Patient has a CHADS2VASC score of 7. Plan was for TEE/DCCV after 3 weeks of anticoagulation. She was admitted for observation 11/08/21 with SOB and orthopnea, found to have bilateral pleural effusions, was diuresed. Seen by Jacolyn Reedy on 11/18/21 and her BB was increased. She has monitored her heart rates since then which have been elevated 100-120 bpm. Her BB was further increased.   Patient is s/p DCCV on 01/02/22. TEE was not performed. Unfortunately, she went back out of rhythm on on 01/09/22 with symptoms of intermittent lightheadedness and fatigue. She was seen by Dr Lalla Brothers and underwent afib ablation 06/06/22. She did have persistent afib post ablation and had another DCCV on 07/01/22.  On follow up today, patient reports that she has been feeling dizzy for two weeks, "like my head is moving while I'm still". On 6/22 she had fatigue, nausea, and body aches. She checked her Kardia mobile which did show rate controlled afib. She was back in SR by the next morning but her symptoms of dizziness persist. No bleeding issues on anticoagulation.   Today, she denies symptoms of palpitations, orthopnea, PND, lower extremity edema, syncope, snoring,  daytime somnolence, bleeding, or neurologic sequela. The patient is tolerating medications without difficulties and is otherwise without complaint today.    Atrial Fibrillation Risk Factors:  she does not have symptoms or diagnosis of sleep apnea. she does not have a history of rheumatic fever. she does not have a history of alcohol use. The patient does not have a history of early familial atrial fibrillation or other arrhythmias.   Atrial Fibrillation Management history:  Previous antiarrhythmic drugs: none Previous cardioversions: 01/02/22, 07/01/22 Previous ablations: 06/06/22 Anticoagulation history: Eliquis   Past Medical History:  Diagnosis Date   Atrial fibrillation (HCC)    Hypertension    Pancreatitis    Shingles     ROS- All systems are reviewed and negative except as per the HPI above.  Physical Exam: Vitals:   01/08/23 1453  BP: 106/68  Pulse: (!) 45  Weight: 79 kg  Height: 5' 4.5" (1.638 m)     GEN: Well nourished, well developed in no acute distress NECK: No JVD; No carotid bruits CARDIAC: Regular rate and rhythm, bradycardia, no murmurs, rubs, gallops RESPIRATORY:  Clear to auscultation without rales, wheezing or rhonchi  ABDOMEN: Soft, non-tender, non-distended EXTREMITIES:  No edema; No deformity    Wt Readings from Last 3 Encounters:  01/08/23 79 kg  11/27/22 79.8 kg  09/09/22 76.7 kg    EKG today demonstrates  SB Vent. rate 45 BPM PR interval 170 ms QRS duration 86 ms QT/QTcB 466/403 ms  Echo 10/22/21 demonstrated   1. Left ventricular ejection fraction, by estimation, is 60 to 65%. The  left ventricle has normal function. The left ventricle has no regional  wall motion abnormalities. There is  mild left ventricular hypertrophy of  the basal-septal segment. Left ventricular diastolic function could not be evaluated.   2. Right ventricular systolic function is normal. The right ventricular  size is normal. There is moderately elevated  pulmonary artery systolic  pressure. The estimated right ventricular systolic pressure is 50.1 mmHg.   3. The pericardial effusion is anterior to the right ventricle.   4. The mitral valve is normal in structure. Trivial mitral valve  regurgitation. No evidence of mitral stenosis.   5. The aortic valve is normal in structure. Aortic valve regurgitation is  not visualized. No aortic stenosis is present.   6. The inferior vena cava is normal in size with greater than 50%  respiratory variability, suggesting right atrial pressure of 3 mmHg.   Epic records are reviewed at length today  CHA2DS2-VASc Score = 7  The patient's score is based upon: CHF History: 0 HTN History: 1 Diabetes History: 0 Stroke History: 2 (ischemic enteritis and splenic infarct (embolic)) Vascular Disease History: 1 (aortic atherosclerosis) Age Score: 2 Gender Score: 1       ASSESSMENT AND PLAN: Persistent Atrial Fibrillation (ICD10:  I48.19) The patient's CHA2DS2-VASc score is 7, indicating a 11.2% annual risk of stroke.   S/p afib ablation 06/06/22 Patient in SR today. She is bradycardic but this is not a new finding for her. We discussed starting AAD (flecainide) or repeat ablation if her afib becomes more frequent.  Continue Eliquis 5 mg BID Continue Lopressor 100 mg BID Kardia mobile for home monitoring   Secondary Hypercoagulable State (ICD10:  [phone]) The patient is at significant risk for stroke/thromboembolism based upon her CHA2DS2-VASc Score of 7.  Continue Apixaban (Eliquis).   HTN Stable on current regimen  Sleep disordered breathing The importance of adequate treatment of sleep apnea was discussed today in order to improve our ability to maintain sinus rhythm long term. Itamar sleep study previously ordered but device did not work. She plans to reach back out to sleep medicine team to have the study done.   Dizziness Unclear etiology, does not appear related to afib. Her bradycardia is not  a new finding but offered trial of decreasing BB. She would like to continue BB at present dose for now. Also discussed wearing a cardiac monitor to evaluate, patient would like to monitor with her Lourena Simmonds mobile for now. She does have a history of dizziness and occasionally takes meclizine.    Follow up with Randall An as scheduled. AF clinic in 6 months.    [name] PA-C [facility] [address] [place], [place] [address] [phone] 01/08/2023 3:12 PM

## 2023-01-23 ENCOUNTER — Ambulatory Visit: Payer: Medicare Other | Attending: Student | Admitting: Student

## 2023-01-23 ENCOUNTER — Encounter: Payer: Self-pay | Admitting: Student

## 2023-01-23 ENCOUNTER — Telehealth: Payer: Self-pay | Admitting: Student

## 2023-01-23 VITALS — BP 138/72 | HR 59 | Wt 177.0 lb

## 2023-01-23 DIAGNOSIS — Z7901 Long term (current) use of anticoagulants: Secondary | ICD-10-CM | POA: Insufficient documentation

## 2023-01-23 DIAGNOSIS — D735 Infarction of spleen: Secondary | ICD-10-CM | POA: Diagnosis not present

## 2023-01-23 DIAGNOSIS — I4819 Other persistent atrial fibrillation: Secondary | ICD-10-CM | POA: Insufficient documentation

## 2023-01-23 DIAGNOSIS — I10 Essential (primary) hypertension: Secondary | ICD-10-CM | POA: Insufficient documentation

## 2023-01-23 NOTE — Telephone Encounter (Signed)
I will follow up with the pt. I left very detailed message for her 4 months that she was approved and left PIN# [id number] on her vm.

## 2023-01-23 NOTE — Telephone Encounter (Signed)
Error

## 2023-01-23 NOTE — Telephone Encounter (Signed)
I called the pt about Itamar study ordered by Dr. Lalla Brothers 08/2022. Pt states the device did not work and she s/w the 800# we provided and they could not figure out why it did not work, device was registered.   Pt then states she had shingles and so she did not want to do the study when she had shingles. Pt is going to need a new device and will come to the [facility] office Tuesday to pick up new device at the front desk. Pt said she will bring the old device back as well.   Pt thanked me for the help and the call today.   I will register new device for the pt and place at the front desk for pick up.   I will need to double check with sleep coordinator if Berkley Harvey is still good or if she needs to obtain new authorization. If need new auth, hopefully we can have this before the pt picks up the new device Tuesday 01/27/23.

## 2023-01-23 NOTE — Telephone Encounter (Signed)
   Good afternoon!  I saw this patient in follow-up today and she mentioned she never heard back in regards to her Itamar sleep study as they had initially sent her a test kit but it was faulty and she did not receive a second one. Can one of you follow-up with her in regards to that?  Thanks,  [place]

## 2023-01-23 NOTE — Patient Instructions (Addendum)
Medication Instructions:  Your physician recommends that you continue on your current medications as directed. Please refer to the Current Medication list given to you today.   Labwork: CBC,BMET  Testing/Procedures: None today  Follow-Up: February /March 2025  Any Other Special Instructions Will Be Listed Below (If Applicable).  If you need a refill on your cardiac medications before your next appointment, please call your pharmacy.

## 2023-01-23 NOTE — Progress Notes (Addendum)
Cardiology Office Note    Date:  01/23/2023  ID:  Shelly Maldonado, DOB [birth year], MRN [id number] Cardiologist: Meriam Sprague, MD   EP: Dr. Lalla Brothers   History of Present Illness:    Shelly Maldonado is a 77 y.o. female  with past medical history of persistent atrial fibrillation (s/p DCCV in 12/2021 with recurrence later that month, s/p ablation in 05/2022 with recurrence in 06/2022 and underwent repeat DCCV), prior ischemic enteritis and splenic infarct, HTN and chronic pancreatitis who presents to the office today for 71-month follow-up.  She was last examined by Alphonzo Severance, PA in 12/2022 and reported a prior episode of feeling fatigued and having nausea and bodyaches. She did check her Kardia device which had shown atrial fibrillation but was back in normal sinus rhythm by the following morning. EKG at the time of follow-up showed she was in normal sinus rhythm. The possibility of Flecainide or repeat ablation was reviewed if her atrial fibrillation became more frequent. She was continued on Eliquis 5 mg twice daily and Lopressor 100 mg twice daily.  In talking with the patient today, she reports having the episode of atrial fibrillation as described at the time of her last office visit and feels that she was perhaps bit by a spider prior to the onset of symptoms. Since then, she reports overall feeling well and denies any recurrent weakness or fatigue. Says she was very active last week and felt the best she has in over a year. She tries to remain active by working outside when temperatures allow. She denies any recent chest pain or dyspnea on exertion. No specific orthopnea, PND or pitting edema. She remains on Eliquis for anticoagulation with no reports of active bleeding.  Studies Reviewed:   EKG: EKG is not ordered today.    Echocardiogram: 10/2021 IMPRESSIONS     1. Left ventricular ejection fraction, by estimation, is 60 to 65%. The  left ventricle has normal function. The left  ventricle has no regional  wall motion abnormalities. There is mild left ventricular hypertrophy of  the basal-septal segment. Left  ventricular diastolic function could not be evaluated.   2. Right ventricular systolic function is normal. The right ventricular  size is normal. There is moderately elevated pulmonary artery systolic  pressure. The estimated right ventricular systolic pressure is 50.1 mmHg.   3. The pericardial effusion is anterior to the right ventricle.   4. The mitral valve is normal in structure. Trivial mitral valve  regurgitation. No evidence of mitral stenosis.   5. The aortic valve is normal in structure. Aortic valve regurgitation is  not visualized. No aortic stenosis is present.   6. The inferior vena cava is normal in size with greater than 50%  respiratory variability, suggesting right atrial pressure of 3 mmHg.   Cardiac CT: 05/2022 IMPRESSION: 1. There is normal pulmonary vein drainage into the left atrium with ostial measurements above.   2. There is no thrombus in the left atrial appendage on delayed contrast imaging.   3. The esophagus runs in the left atrial midline and is not in proximity to any of the pulmonary vein ostia.   4. No PFO/ASD.   5. Normal coronary origin. Right dominance.   6. CAC score of 0 which is 0 percentile for age-, race-, and sex-matched controls.   7. Mild aortic atherosclerosis.   Risk Assessment/Calculations:    CHA2DS2-VASc Score = 5   This indicates a 7.2% annual risk of stroke. The patient's score is  based upon: CHF History: 0 HTN History: 1 Diabetes History: 0 Stroke History: 0 Vascular Disease History: 1 Age Score: 2 Gender Score: 1       STOP-Bang Score:  4        Physical Exam:   VS:  BP 138/72   Pulse (!) 59   Wt 177 lb (80.3 kg)   SpO2 98%   BMI 29.91 kg/m    Wt Readings from Last 3 Encounters:  01/23/23 177 lb (80.3 kg)  01/08/23 174 lb 3.2 oz (79 kg)  11/27/22 176 lb (79.8 kg)      GEN: Pleasant female appearing in no acute distress NECK: No JVD; No carotid bruits CARDIAC: RRR, no murmurs, rubs, gallops RESPIRATORY:  Clear to auscultation without rales, wheezing or rhonchi  ABDOMEN: Appears non-distended. No obvious abdominal masses. EXTREMITIES: No clubbing or cyanosis. No pitting edema.  Distal pedal pulses are 2+ bilaterally.   Assessment and Plan:   1. Paroxysmal Atrial Fibrillation/Use of Anticoagulation - She did have an episode of atrial fibrillation by review of her Kardia monitor in 12/2022 but denies any recurrent episodes since. As discussed during her recent visit with Atrial Fibrillation Clinic and as discussed today, she wishes to avoid a repeat ablation or additional medical therapy unless this becomes a more frequent issue. Continue Lopressor 100 mg twice daily. - No reports of active bleeding. She remains on Eliquis 5 mg twice daily for anticoagulation which is the appropriate dose given her age, weight and renal function. Will recheck a CBC and BMET.  - In regards to screening for OSA, she never heard back in regards to her Itamar sleep study as the initial test kit was faulty. I will send a message to our team in [place] today so this can be followed-up on.   2. HTN - BP was initially recorded at 152/98, rechecked and improved to 138/72. She reports this has overall been well-controlled when checked at home. Continue current medical therapy with Lopressor 100 mg twice daily.  3. History of Ischemic Enteritis and Splenic Infarcts - She is followed by Hematology with recommendations to continue Eliquis indefinitely. Will recheck a CBC and BMET.     Signed, Ellsworth Lennox, PA-C

## 2023-01-27 ENCOUNTER — Other Ambulatory Visit (HOSPITAL_COMMUNITY)
Admission: RE | Admit: 2023-01-27 | Discharge: 2023-01-27 | Disposition: A | Discharge status: Home / Self Care | Payer: Medicare Other | Source: Ambulatory Visit | Attending: Student | Admitting: Student

## 2023-01-27 DIAGNOSIS — I4819 Other persistent atrial fibrillation: Secondary | ICD-10-CM | POA: Diagnosis not present

## 2023-01-27 LAB — CBC
HCT: 42.7 % (ref 36.0–46.0)
Hemoglobin: 14.1 g/dL (ref 12.0–15.0)
MCH: 32.9 pg (ref 26.0–34.0)
MCHC: 33 g/dL (ref 30.0–36.0)
MCV: 99.5 fL (ref 80.0–100.0)
Platelets: 353 10*3/uL (ref 150–400)
RBC: 4.29 MIL/uL (ref 3.87–5.11)
RDW: 13.2 % (ref 11.5–15.5)
WBC: 7.7 10*3/uL (ref 4.0–10.5)
nRBC: 0 % (ref 0.0–0.2)

## 2023-01-27 LAB — BASIC METABOLIC PANEL
Anion gap: 7 (ref 5–15)
BUN: 21 mg/dL (ref 8–23)
CO2: 26 mmol/L (ref 22–32)
Calcium: 9.2 mg/dL (ref 8.9–10.3)
Chloride: 106 mmol/L (ref 98–111)
Creatinine, Ser: 1.06 mg/dL — ABNORMAL HIGH (ref 0.44–1.00)
GFR, Estimated: 54 mL/min — ABNORMAL LOW (ref >60)
Glucose, Bld: 88 mg/dL (ref 70–99)
Potassium: 4.4 mmol/L (ref 3.5–5.1)
Sodium: 139 mmol/L (ref 135–145)

## 2023-01-28 NOTE — Telephone Encounter (Signed)
Pt returned Itamar device yesterday serial # [id number] that she states did not work. Pt picked up the replacement device yesterday serial # [id number].   I will un-register the 535 device that did not work.

## 2023-01-29 ENCOUNTER — Encounter (INDEPENDENT_AMBULATORY_CARE_PROVIDER_SITE_OTHER): Payer: Medicare Other | Admitting: Cardiology

## 2023-01-29 DIAGNOSIS — G4733 Obstructive sleep apnea (adult) (pediatric): Secondary | ICD-10-CM

## 2023-01-30 ENCOUNTER — Ambulatory Visit: Payer: Medicare Other | Attending: Cardiology

## 2023-01-30 DIAGNOSIS — I4819 Other persistent atrial fibrillation: Secondary | ICD-10-CM

## 2023-01-30 DIAGNOSIS — I10 Essential (primary) hypertension: Secondary | ICD-10-CM

## 2023-01-30 NOTE — Procedures (Signed)
   SLEEP STUDY REPORT Patient Information Study Date: 01/29/2023 Patient Name: Shelly Maldonado Patient ID: [id number] Birth Date: 1945/11/16 Age: 77 Gender: Female BMI: 29.0 (W=170 lb, H=5' 4'') Stopbang: 4 Referring Physician: Steffanie Dunn, MD  TEST DESCRIPTION: Home sleep apnea testing was completed using the WatchPat, a Type 1 device, utilizing  peripheral arterial tonometry (PAT), chest movement, actigraphy, pulse oximetry, pulse rate, body position and snore.  AHI was calculated with apnea and hypopnea using valid sleep time as the denominator. RDI includes apneas,  hypopneas, and RERAs. The data acquired and the scoring of sleep and all associated events were performed in  accordance with the recommended standards and specifications as outlined in the AASM Manual for the Scoring of  Sleep and Associated Events 2.2.0 (2015).   FINDINGS: 1. No evidence of Obstructive Sleep Apnea with AHI 3.6/hr overall but during REM sleep it is mild with REM AHI  11.4/hr.  2. No Central Sleep Apnea. 3. Oxygen desaturations as low as 78%. 4. Minimal snoring was present. O2 sats were < 88% for 0.1 minutes. 5. Total sleep time was 6 hrs and 20 min. 6. 15.7% of total sleep time was spent in REM sleep.  7. sleep onset latency at 16 min.  8. REM sleep onset latency at 52 min.  9. Total awakenings were 5 .   DIAGNOSIS:  Normal study with no significant sleep disordered breathing.  RECOMMENDATIONS: 1. Normal study with no significant sleep disordered breathing. 2. Healthy sleep recommendations include: adequate nightly sleep (normal 7-9 hrs/night), avoidance of caffeine after  noon and alcohol near bedtime, and maintaining a sleep environment that is cool, dark and quiet. 3. Weight loss for overweight patients is recommended.  4. Snoring recommendations include: weight loss where appropriate, side sleeping, and avoidance of alcohol before  bed. 5. Operation of motor vehicle or dangerous  equipment must be avoided when feeling drowsy, excessively sleepy, or  mentally fatigued.  6. An ENT consultation which may be useful for specific causes of and possible treatment of bothersome snoring .  7. Weight loss may be of benefit in reducing the severity of snoring.   Signature: Armanda Magic, MD; [facility]; Diplomat, American Board of Sleep  Medicine Electronically Signed: 01/30/2023 12:47:25 PM

## 2023-02-18 ENCOUNTER — Telehealth: Payer: Self-pay | Admitting: *Deleted

## 2023-02-18 NOTE — Telephone Encounter (Signed)
The patient has been notified of the result. Left detailed message on voicemail and informed patient to call back..,  Green, CMA   

## 2023-02-18 NOTE — Telephone Encounter (Signed)
-----   Message from Armanda Magic sent at 01/30/2023 12:48 PM EDT ----- Normal home sleep study so in lab PSG will be ordered

## 2023-03-05 ENCOUNTER — Other Ambulatory Visit (HOSPITAL_COMMUNITY): Payer: Self-pay | Admitting: Physician Assistant

## 2023-03-05 DIAGNOSIS — I482 Chronic atrial fibrillation, unspecified: Secondary | ICD-10-CM

## 2023-03-14 DIAGNOSIS — N3001 Acute cystitis with hematuria: Secondary | ICD-10-CM | POA: Diagnosis not present

## 2023-04-06 ENCOUNTER — Other Ambulatory Visit (HOSPITAL_COMMUNITY): Payer: Self-pay | Admitting: *Deleted

## 2023-04-06 DIAGNOSIS — I482 Chronic atrial fibrillation, unspecified: Secondary | ICD-10-CM

## 2023-04-06 MED ORDER — ELIQUIS 5 MG PO TABS: 5.0000 mg | ORAL_TABLET | Freq: Two times a day (BID) | ORAL | 6 refills | Status: DC

## 2023-05-19 ENCOUNTER — Inpatient Hospital Stay: Payer: Medicare Other

## 2023-05-20 ENCOUNTER — Inpatient Hospital Stay: Payer: Medicare Other | Attending: Hematology

## 2023-05-20 DIAGNOSIS — I4891 Unspecified atrial fibrillation: Secondary | ICD-10-CM | POA: Insufficient documentation

## 2023-05-20 DIAGNOSIS — Z87440 Personal history of urinary (tract) infections: Secondary | ICD-10-CM | POA: Diagnosis not present

## 2023-05-20 DIAGNOSIS — K55069 Acute infarction of intestine, part and extent unspecified: Secondary | ICD-10-CM

## 2023-05-20 DIAGNOSIS — Z7901 Long term (current) use of anticoagulants: Secondary | ICD-10-CM | POA: Diagnosis not present

## 2023-05-20 DIAGNOSIS — D735 Infarction of spleen: Secondary | ICD-10-CM | POA: Insufficient documentation

## 2023-05-20 DIAGNOSIS — Z86718 Personal history of other venous thrombosis and embolism: Secondary | ICD-10-CM | POA: Diagnosis not present

## 2023-05-20 LAB — CBC WITH DIFFERENTIAL/PLATELET
Abs Immature Granulocytes: 0.03 10*3/uL (ref 0.00–0.07)
Basophils Absolute: 0.1 10*3/uL (ref 0.0–0.1)
Basophils Relative: 2 %
Eosinophils Absolute: 0.2 10*3/uL (ref 0.0–0.5)
Eosinophils Relative: 3 %
HCT: 43.4 % (ref 36.0–46.0)
Hemoglobin: 14.1 g/dL (ref 12.0–15.0)
Immature Granulocytes: 0 %
Lymphocytes Relative: 28 %
Lymphs Abs: 2.1 10*3/uL (ref 0.7–4.0)
MCH: 32.2 pg (ref 26.0–34.0)
MCHC: 32.5 g/dL (ref 30.0–36.0)
MCV: 99.1 fL (ref 80.0–100.0)
Monocytes Absolute: 0.9 10*3/uL (ref 0.1–1.0)
Monocytes Relative: 12 %
Neutro Abs: 4.2 10*3/uL (ref 1.7–7.7)
Neutrophils Relative %: 55 %
Platelets: 358 10*3/uL (ref 150–400)
RBC: 4.38 MIL/uL (ref 3.87–5.11)
RDW: 13.2 % (ref 11.5–15.5)
WBC: 7.5 10*3/uL (ref 4.0–10.5)
nRBC: 0 % (ref 0.0–0.2)

## 2023-05-20 LAB — BASIC METABOLIC PANEL - CANCER CENTER ONLY
Anion gap: 7 (ref 5–15)
BUN: 11 mg/dL (ref 8–23)
CO2: 27 mmol/L (ref 22–32)
Calcium: 9 mg/dL (ref 8.9–10.3)
Chloride: 101 mmol/L (ref 98–111)
Creatinine: 1.04 mg/dL — ABNORMAL HIGH (ref 0.44–1.00)
GFR, Estimated: 56 mL/min — ABNORMAL LOW (ref >60)
Glucose, Bld: 85 mg/dL (ref 70–99)
Potassium: 4.6 mmol/L (ref 3.5–5.1)
Sodium: 135 mmol/L (ref 135–145)

## 2023-05-20 LAB — D-DIMER, QUANTITATIVE: D-Dimer, Quant: 0.29 ug{FEU}/mL (ref 0.00–0.50)

## 2023-05-26 NOTE — Progress Notes (Unsigned)
[facility] 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 Shelly Maldonado 77 y.o. 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  She was last seen by Rojelio Brenner PA-C on 11/27/2022.  At today's visit, she reports feeling fairly well.  No recent hospitalizations, surgeries, or changes in baseline health status.  She remains on Eliquis and is tolerating this very well.  She denies any bleeding issues.   She had some abdominal pain last week hat was typical for her pancreatitis flare, but this has resolved.   She has not had any ongoing nausea or vomiting.  No other interval VTE, or current symptoms of DVT or PE.    She has chronic fatigue with 40% energy, but has 100% appetite. She endorses that she is maintaining a stable weight.   ASSESSMENT & PLAN:  1.  Thromboembolism of SMA: - Admission to [facility] from 10/11/2021 through 10/16/2021 with UTI, diagnosed with A-fib and started on Eliquis. - Admitted to [facility] from 10/21/2021 - 10/26/2021 for weakness and diarrhea. - CT AP with contrast (10/21/2021): Abnormal bowel wall thickening of multiple loops of jejunum, suspected acute emboli within 2 proximal jejunal branches of SMA.  Both branches appear to have some contrast extending distal to the emboli which may indicate lack of total occlusion or collateralization.  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  Appearance raises concern for central source of emboli.  There is some moderate abdominal aortic atherosclerotic calcification although relatively little calcified atherosclerosis in the SMA itself. - Hypercoagulable testing (10/22/2021): LA negative,  anticardiolipin antibody negative, antibeta-2 glycoprotein antibodies negative, factor V Leiden and prothrombin gene mutation negative  - She is being continued on Eliquis, which she is tolerating very well.  Denies any abdominal pain today.  No prior history of miscarriages.  No prior DVT/PE. - She does not report any abdominal pain at this time.  No interval VTE or symptoms of acute VTE. - Most recent labs (05/20/2023) show normal D-dimer 0.29 - PLAN: Continue annual follow-up for ongoing anticoagulation (Eliquis). - Indefinite Eliquis recommended in the setting of atrial fibrillation and history of thromboembolism of SMA with splenic infarct.  2.  Chronic diarrhea: - She had diarrhea since chronic pancreatitis / pancreatic insufficiency diagnosed in 2019 - She stopped taking Creon and diarrhea has improved.  3.  Social/family history: - She lives by herself at home and is independent of ADLs and IADLs.  Sister lives close by.  She is a retired Public relations account executive.  Quit smoking 45 years ago.  No alcohol use. - No family history of miscarriages or lupus anticoagulant.  Mother had non-Hodgkin's lymphoma.  Maternal grandmother had stomach cancer.  3 maternal uncles had cancer.  PLAN SUMMARY: >> Labs in 1 year = CBC/D, BMP, D-dimer >> OFFICE visit after labs     REVIEW OF SYSTEMS:   Review of Systems  Constitutional:  Positive for fatigue. Negative for appetite change, chills, diaphoresis, fever and unexpected weight change.  HENT:   Negative for lump/mass and nosebleeds.   Eyes:  Negative for eye problems.  Respiratory:  Negative for cough, hemoptysis and shortness of breath.   Cardiovascular:  Negative  for chest pain, leg swelling and palpitations.  Gastrointestinal:  Positive for diarrhea and nausea. Negative for abdominal pain, blood in stool, constipation and vomiting.  Genitourinary:  Negative for hematuria.   Neurological:  Positive for dizziness and numbness. Negative for  headaches and light-headedness.  Hematological:  Does not bruise/bleed easily.  Psychiatric/Behavioral:  Positive for sleep disturbance.      PHYSICAL EXAM:  ECOG PERFORMANCE STATUS: 1 - Symptomatic but completely ambulatory  Vitals:   05/27/23 1308  BP: (!) 187/80  Pulse: (!) 56  Resp: 16  Temp: 98.3 F (36.8 C)  SpO2: 95%    Filed Weights   05/27/23 1308  Weight: 196 lb 3.4 oz (89 kg)    Physical Exam Constitutional:      Appearance: Normal appearance. She is obese.  Cardiovascular:     Heart sounds: Normal heart sounds.  Pulmonary:     Breath sounds: Normal breath sounds.  Neurological:     General: No focal deficit present.     Mental Status: Mental status is at baseline.  Psychiatric:        Behavior: Behavior normal. Behavior is cooperative.     PAST MEDICAL/SURGICAL HISTORY:  Past Medical History:  Diagnosis Date   Atrial fibrillation (HCC)    Hypertension    Pancreatitis    Shingles    Past Surgical History:  Procedure Laterality Date   ATRIAL FIBRILLATION ABLATION N/A 06/06/2022   Procedure: ATRIAL FIBRILLATION ABLATION;  Surgeon: Lanier Prude, MD;  Location: [facility] INVASIVE CV LAB;  Service: Cardiovascular;  Laterality: N/A;   CARDIOVERSION N/A 01/02/2022   Procedure: CARDIOVERSION;  Surgeon: Chrystie Nose, MD;  Location: [facility] ENDOSCOPY;  Service: Cardiovascular;  Laterality: N/A;   CARDIOVERSION N/A 07/01/2022   Procedure: CARDIOVERSION;  Surgeon: Jodelle Red, MD;  Location: [facility] ENDOSCOPY;  Service: Cardiovascular;  Laterality: N/A;   CATARACT EXTRACTION Bilateral 2016   CHOLECYSTECTOMY     COLONOSCOPY N/A 03/04/2018   Procedure: COLONOSCOPY;  Surgeon: Malissa Hippo, MD;  Location: [facility] ENDO SUITE;  Service: Endoscopy;  Laterality: N/A;  12:45   complete hysterecomy      SOCIAL HISTORY:  Social History   Socioeconomic History   Marital status: Divorced    Spouse name: Not on file   Number of children: Not on file   Years of  education: Not on file   Highest education level: Not on file  Occupational History   Not on file  Tobacco Use   Smoking status: Former   Smokeless tobacco: Never   Tobacco comments:    Former smoker 12/25/21 smoked 48 yrs ago.   Vaping Use   Vaping status: Never Used  Substance and Sexual Activity   Alcohol use: Never   Drug use: Never   Sexual activity: Not on file  Other Topics Concern   Not on file  Social History Narrative   Not on file   Social Determinants of Health   Financial Resource Strain: Not on file  Food Insecurity: Not on file  Transportation Needs: Not on file  Physical Activity: Not on file  Stress: Not on file  Social Connections: Not on file  Intimate Partner Violence: Not on file    FAMILY HISTORY:  Family History  Problem Relation Age of Onset   Cancer Mother    Heart attack Father    Colon cancer Neg Hx     CURRENT MEDICATIONS:  Outpatient Encounter Medications as of 05/27/2023  Medication Sig   acetaminophen (TYLENOL) 500 MG  tablet Take 500 mg by mouth every 6 (six) hours as needed for moderate pain or headache.   ELIQUIS 5 MG TABS tablet Take 1 tablet (5 mg total) by mouth 2 (two) times daily.   metoprolol tartrate (LOPRESSOR) 100 MG tablet Take 1 tablet (100 mg total) by mouth 2 (two) times daily.   metroNIDAZOLE (METROGEL) 1 % gel Apply 1 Application topically daily as needed (rosacea).   [DISCONTINUED] meclizine (ANTIVERT) 12.5 MG tablet Take 12.5 mg by mouth 3 (three) times daily as needed for dizziness.   No facility-administered encounter medications on file as of 05/27/2023.    ALLERGIES:  Allergies  Allergen Reactions   Codeine Other (See Comments)    Unknown reaction, tolerates hydrocodone    Morphine And Codeine     Vein became red    LABORATORY DATA:  I have reviewed the labs as listed.  CBC    Component Value Date/Time   WBC 7.5 05/20/2023 1419   RBC 4.38 05/20/2023 1419   HGB 14.1 05/20/2023 1419   HCT 43.4  05/20/2023 1419   PLT 358 05/20/2023 1419   MCV 99.1 05/20/2023 1419   MCH 32.2 05/20/2023 1419   MCHC 32.5 05/20/2023 1419   RDW 13.2 05/20/2023 1419   LYMPHSABS 2.1 05/20/2023 1419   MONOABS 0.9 05/20/2023 1419   EOSABS 0.2 05/20/2023 1419   BASOSABS 0.1 05/20/2023 1419      Latest Ref Rng & Units 05/20/2023    2:19 PM 01/27/2023   11:28 AM 06/24/2022    6:00 PM  CMP  Glucose 70 - 99 mg/dL 85  88  91   BUN 8 - 23 mg/dL 11  21  14    Creatinine 0.44 - 1.00 mg/dL 1.61  0.96  0.45   Sodium 135 - 145 mmol/L 135  139  138   Potassium 3.5 - 5.1 mmol/L 4.6  4.4  4.6   Chloride 98 - 111 mmol/L 101  106  104   CO2 22 - 32 mmol/L 27  26  23    Calcium 8.9 - 10.3 mg/dL 9.0  9.2  9.6     DIAGNOSTIC IMAGING:  I have independently reviewed the relevant imaging and discussed with the patient.   WRAP UP:  All questions were answered. The patient knows to call the clinic with any problems, questions or concerns.  Medical decision making: Low  Time spent on visit: I spent 15 minutes counseling the patient face to face. The total time spent in the appointment was 22 minutes and more than 50% was on counseling.  Carnella Guadalajara, PA-C  05/27/23 1:31 PM

## 2023-05-27 ENCOUNTER — Inpatient Hospital Stay (HOSPITAL_BASED_OUTPATIENT_CLINIC_OR_DEPARTMENT_OTHER): Payer: Medicare Other | Admitting: Physician Assistant

## 2023-05-27 VITALS — BP 187/80 | HR 56 | Temp 98.3°F | Resp 16 | Wt 196.2 lb

## 2023-05-27 DIAGNOSIS — Z87440 Personal history of urinary (tract) infections: Secondary | ICD-10-CM | POA: Diagnosis not present

## 2023-05-27 DIAGNOSIS — K55069 Acute infarction of intestine, part and extent unspecified: Secondary | ICD-10-CM | POA: Diagnosis not present

## 2023-05-27 DIAGNOSIS — D735 Infarction of spleen: Secondary | ICD-10-CM

## 2023-05-27 DIAGNOSIS — Z86718 Personal history of other venous thrombosis and embolism: Secondary | ICD-10-CM | POA: Diagnosis not present

## 2023-05-27 DIAGNOSIS — Z7901 Long term (current) use of anticoagulants: Secondary | ICD-10-CM | POA: Diagnosis not present

## 2023-05-27 DIAGNOSIS — I4891 Unspecified atrial fibrillation: Secondary | ICD-10-CM | POA: Diagnosis not present

## 2023-05-27 NOTE — Patient Instructions (Signed)
[facility] at [facility] **VISIT SUMMARY & IMPORTANT INSTRUCTIONS **   You were seen today by Rojelio Brenner PA-C for your story of blood clots in your abdominal artery.   Continue taking Eliquis twice daily. Seek medical attention if you notice any severe abdominal pain, nausea, or vomiting, since these could be signs of another blood clot.  FOLLOW-UP APPOINTMENT: 1 year  ** Thank you for trusting me with your healthcare!  I strive to provide all of my patients with quality care at each visit.  If you receive a survey for this visit, I would be so grateful to you for taking the time to provide feedback.  Thank you in advance!  ~ Kaye Mitro                   Dr. Doreatha Massed   &   Rojelio Brenner, PA-C   - - - - - - - - - - - - - - - - - -    Thank you for choosing [facility] at [facility] to provide your oncology and hematology care.  To afford each patient quality time with our provider, please arrive at least 15 minutes before your scheduled appointment time.   If you have a lab appointment with the Cancer Center please come in thru the Main Entrance and check in at the main information desk.  You need to re-schedule your appointment should you arrive 10 or more minutes late.  We strive to give you quality time with our providers, and arriving late affects you and other patients whose appointments are after yours.  Also, if you no show three or more times for appointments you may be dismissed from the clinic at the providers discretion.     Again, thank you for choosing [facility].  Our hope is that these requests will decrease the amount of time that you wait before being seen by our physicians.       _____________________________________________________________  Should you have questions after your visit to [facility], please contact our office at [phone] and follow the prompts.  Our office  hours are 8:00 a.m. and 4:30 p.m. Monday - Friday.  Please note that voicemails left after 4:00 p.m. may not be returned until the following business day.  We are closed weekends and major holidays.  You do have access to a nurse 24-7, just call the main number to the clinic [phone] and do not press any options, hold on the line and a nurse will answer the phone.    For prescription refill requests, have your pharmacy contact our office and allow 72 hours.     h

## 2023-06-04 DIAGNOSIS — Z23 Encounter for immunization: Secondary | ICD-10-CM | POA: Diagnosis not present

## 2023-06-25 ENCOUNTER — Encounter (HOSPITAL_COMMUNITY): Payer: Self-pay | Admitting: Physician Assistant

## 2023-06-25 ENCOUNTER — Ambulatory Visit (HOSPITAL_COMMUNITY)
Admission: RE | Admit: 2023-06-25 | Discharge: 2023-06-25 | Disposition: A | Discharge status: Home / Self Care | Payer: Medicare Other | Source: Ambulatory Visit | Attending: Physician Assistant | Admitting: Physician Assistant

## 2023-06-25 VITALS — BP 132/70 | HR 59 | Ht 64.5 in | Wt 187.6 lb

## 2023-06-25 DIAGNOSIS — I4891 Unspecified atrial fibrillation: Secondary | ICD-10-CM | POA: Diagnosis present

## 2023-06-25 DIAGNOSIS — K861 Other chronic pancreatitis: Secondary | ICD-10-CM | POA: Insufficient documentation

## 2023-06-25 DIAGNOSIS — K559 Vascular disorder of intestine, unspecified: Secondary | ICD-10-CM | POA: Diagnosis not present

## 2023-06-25 DIAGNOSIS — Z7901 Long term (current) use of anticoagulants: Secondary | ICD-10-CM | POA: Insufficient documentation

## 2023-06-25 DIAGNOSIS — I4819 Other persistent atrial fibrillation: Secondary | ICD-10-CM

## 2023-06-25 DIAGNOSIS — I10 Essential (primary) hypertension: Secondary | ICD-10-CM | POA: Diagnosis not present

## 2023-06-25 DIAGNOSIS — I700 Atherosclerosis of aorta: Secondary | ICD-10-CM | POA: Insufficient documentation

## 2023-06-25 DIAGNOSIS — D6869 Other thrombophilia: Secondary | ICD-10-CM | POA: Diagnosis not present

## 2023-06-25 MED ORDER — METOPROLOL TARTRATE 50 MG PO TABS: 50.0000 mg | ORAL_TABLET | Freq: Two times a day (BID) | ORAL | 1 refills | Status: DC

## 2023-06-25 NOTE — Patient Instructions (Signed)
Decrease Metoprolol 50mg  twice daily

## 2023-06-25 NOTE — Progress Notes (Signed)
Primary Care Physician: Toma Deiters, MD Primary Cardiologist: Dr Shari Prows Primary Electrophysiologist: Dr Lalla Brothers Referring Physician: Dr Shari Prows   Shelly Maldonado is a 77 y.o. female with a history of HTN, aortic atherosclerosis, chronic pancreatitis, ischemic enteritis, atrial fibrillation who presents for follow up in the [facility].  The patient was initially diagnosed with atrial fibrillation during hospitalization in [place] from 10/11/21-10/16/21. She was started in Eliquis and metoprolol at that time. She was admitted to [facility] 10/21/21 with ischemic enteritis with splenic infarct concerning for embolus as well as pneumonia. Embolic event not felt to be an Eliquis failure as she has just started the medication. Patient has a CHADS2VASC score of 7. Plan was for TEE/DCCV after 3 weeks of anticoagulation. She was admitted for observation 11/08/21 with SOB and orthopnea, found to have bilateral pleural effusions, was diuresed. Seen by Jacolyn Reedy on 11/18/21 and her BB was increased. She has monitored her heart rates since then which have been elevated 100-120 bpm. Her BB was further increased.   Patient is s/p DCCV on 01/02/22. TEE was not performed. Unfortunately, she went back out of rhythm on on 01/09/22 with symptoms of intermittent lightheadedness and fatigue. She was seen by Dr Lalla Brothers and underwent afib ablation 06/06/22. She did have persistent afib post ablation and had another DCCV on 07/01/22.  On follow up today, patient reports that she has done well since her last visit. She denies any interim symptoms of afib. She does still become fatigued at times. No bleeding issues on anticoagulation.   Today, she denies symptoms of palpitations, orthopnea, PND, lower extremity edema, syncope, snoring, daytime somnolence, bleeding, or neurologic sequela. The patient is tolerating medications without difficulties and is otherwise without complaint today.     Atrial Fibrillation Risk Factors:  she does not have symptoms or diagnosis of sleep apnea. Negative sleep study 01/29/23 she does not have a history of rheumatic fever. she does not have a history of alcohol use. The patient does not have a history of early familial atrial fibrillation or other arrhythmias.   Atrial Fibrillation Management history:  Previous antiarrhythmic drugs: none Previous cardioversions: 01/02/22, 07/01/22 Previous ablations: 06/06/22 Anticoagulation history: Eliquis   Past Medical History:  Diagnosis Date   Atrial fibrillation (HCC)    Hypertension    Pancreatitis    Shingles     ROS- All systems are reviewed and negative except as per the HPI above.  Physical Exam: Vitals:   06/25/23 1351  BP: 132/70  Pulse: (!) 59  Weight: 85.1 kg  Height: 5' 4.5" (1.638 m)    GEN: Well nourished, well developed in no acute distress NECK: No JVD; No carotid bruits CARDIAC: Regular rate and rhythm, no murmurs, rubs, gallops RESPIRATORY:  Clear to auscultation without rales, wheezing or rhonchi  ABDOMEN: Soft, non-tender, non-distended EXTREMITIES:  No edema; No deformity    Wt Readings from Last 3 Encounters:  06/25/23 85.1 kg  05/27/23 89 kg  01/23/23 80.3 kg    EKG today demonstrates  SB Vent. rate 59 BPM PR interval 152 ms QRS duration 80 ms QT/QTcB 414/409 ms  Echo 10/22/21 demonstrated   1. Left ventricular ejection fraction, by estimation, is 60 to 65%. The  left ventricle has normal function. The left ventricle has no regional  wall motion abnormalities. There is mild left ventricular hypertrophy of  the basal-septal segment. Left ventricular diastolic function could not be evaluated.   2. Right ventricular systolic function is normal. The  right ventricular  size is normal. There is moderately elevated pulmonary artery systolic  pressure. The estimated right ventricular systolic pressure is 50.1 mmHg.   3. The pericardial effusion is  anterior to the right ventricle.   4. The mitral valve is normal in structure. Trivial mitral valve  regurgitation. No evidence of mitral stenosis.   5. The aortic valve is normal in structure. Aortic valve regurgitation is  not visualized. No aortic stenosis is present.   6. The inferior vena cava is normal in size with greater than 50%  respiratory variability, suggesting right atrial pressure of 3 mmHg.   Epic records are reviewed at length today  CHA2DS2-VASc Score = 5  The patient's score is based upon: CHF History: 0 HTN History: 1 Diabetes History: 0 Stroke History: 0 Vascular Disease History: 1 Age Score: 2 Gender Score: 1       ASSESSMENT AND PLAN: Persistent Atrial Fibrillation (ICD10:  I48.19) The patient's CHA2DS2-VASc score is 5, indicating a 7.2% annual risk of stroke.   S/p afib ablation 06/06/22 Patient appears to be maintaining SR Continue Eliquis 5 mg BID Will decrease Lopressor to 50 mg BID to see if this helps with fatigue.  Kardia mobile for home monitoring   Secondary Hypercoagulable State (ICD10:  [phone]) The patient is at significant risk for stroke/thromboembolism based upon her CHA2DS2-VASc Score of 5.  Continue Apixaban (Eliquis).   HTN Stable on current regimen Med changes as above.    Follow up in the AF clinic in 6 months.    [name] PA-C [facility] [address] [place], [place] [address] [phone] 06/25/2023 2:01 PM

## 2023-08-07 ENCOUNTER — Emergency Department (HOSPITAL_COMMUNITY)
Admission: EM | Admit: 2023-08-07 | Discharge: 2023-08-07 | Disposition: A | Discharge status: Home / Self Care | Payer: Medicare Other | Attending: Emergency Medicine | Admitting: Emergency Medicine

## 2023-08-07 ENCOUNTER — Encounter (HOSPITAL_COMMUNITY): Payer: Self-pay | Admitting: *Deleted

## 2023-08-07 ENCOUNTER — Other Ambulatory Visit: Payer: Self-pay

## 2023-08-07 DIAGNOSIS — R21 Rash and other nonspecific skin eruption: Secondary | ICD-10-CM | POA: Diagnosis present

## 2023-08-07 DIAGNOSIS — B029 Zoster without complications: Secondary | ICD-10-CM | POA: Diagnosis not present

## 2023-08-07 DIAGNOSIS — I10 Essential (primary) hypertension: Secondary | ICD-10-CM | POA: Insufficient documentation

## 2023-08-07 DIAGNOSIS — Z7901 Long term (current) use of anticoagulants: Secondary | ICD-10-CM | POA: Diagnosis not present

## 2023-08-07 DIAGNOSIS — Z79899 Other long term (current) drug therapy: Secondary | ICD-10-CM | POA: Insufficient documentation

## 2023-08-07 MED ORDER — VALACYCLOVIR HCL 1 G PO TABS: 1000.0000 mg | ORAL_TABLET | Freq: Three times a day (TID) | ORAL | 0 refills | Status: AC

## 2023-08-07 NOTE — Discharge Instructions (Signed)
Return if any problems.

## 2023-08-07 NOTE — ED Provider Notes (Signed)
[place] EMERGENCY DEPARTMENT AT [facility] Provider Note   CSN: [id number] Arrival date & time: 08/07/23  1311     History  No chief complaint on file.   Shelly Maldonado is a 78 y.o. female.  Patient complains of an outbreaks of shingles.  Patient reports that she has had shingles multiple times in the past.  Patient states that it always starts with a itchy area on her back and then develops on the same area on her left thigh.  Patient reports she has taken acyclovir and Valtrex in the past for the same.  Patient denies any fever or chills she is not having any shortness of breath.  Patient has a past medical history of hypertension.  The history is provided by the patient. No language interpreter was used.       Home Medications Prior to Admission medications   Medication Sig Start Date End Date Taking? Authorizing Provider  valACYclovir (VALTREX) 1000 MG tablet Take 1 tablet (1,000 mg total) by mouth 3 (three) times daily for 7 days. 08/07/23 08/14/23 Yes Elson Areas, PA-C  acetaminophen (TYLENOL) 500 MG tablet Take 500 mg by mouth every 6 (six) hours as needed for moderate pain or headache.    [provider]  ELIQUIS 5 MG TABS tablet Take 1 tablet (5 mg total) by mouth 2 (two) times daily. 04/06/23   Fenton, Clint R, PA  metoprolol tartrate (LOPRESSOR) 50 MG tablet Take 1 tablet (50 mg total) by mouth 2 (two) times daily. 06/25/23 06/19/24  Fenton, Clint R, PA  metroNIDAZOLE (METROGEL) 1 % gel Apply 1 Application topically daily as needed (rosacea).    [provider]      Allergies    Codeine and Morphine and codeine    Review of Systems   Review of Systems  All other systems reviewed and are negative.   Physical Exam Updated Vital Signs BP (!) 217/89 (BP Location: Right Arm)   Pulse (!) 57   Temp 99 F (37.2 C)   Resp 18   Ht 5' 4.5" (1.638 m)   Wt 81.6 kg   SpO2 94%   BMI 30.42 kg/m  Physical Exam Vitals and nursing note  reviewed.  Constitutional:      Appearance: She is well-developed.  HENT:     Head: Normocephalic.  Cardiovascular:     Rate and Rhythm: Normal rate.  Pulmonary:     Effort: Pulmonary effort is normal.  Abdominal:     General: There is no distension.  Musculoskeletal:        General: Normal range of motion.     Cervical back: Normal range of motion.     Comments: Red raised area left thigh, multiple small pustules, looks like shingles.  Neurological:     Mental Status: She is alert and oriented to person, place, and time.     ED Results / Procedures / Treatments   Labs (all labs ordered are listed, but only abnormal results are displayed) Labs Reviewed - No data to display  EKG None  Radiology No results found.  Procedures Procedures    Medications Ordered in ED Medications - No data to display  ED Course/ Medical Decision Making/ A&P                                 Medical Decision Making Patient complains planes of a shingles outbreak.  She has had  multiple outbreaks of shingles in the past.  Risk Prescription drug management. Risk Details: Rash looks herpetic, consistent with shingles.  Patient is given a prescription for Valtrex she is advised to follow-up with her primary care physician.           Final Clinical Impression(s) / ED Diagnoses Final diagnoses:  Herpes zoster without complication  Hypertension, unspecified type    Rx / DC Orders ED Discharge Orders          Ordered    valACYclovir (VALTREX) 1000 MG tablet  3 times daily        08/07/23 1413           An After Visit Summary was printed and given to the patient.    Elson Areas, [place] 08/07/23 1421    Gerhard Munch, MD 08/07/23 [phone]

## 2023-08-07 NOTE — ED Triage Notes (Signed)
Pt with recurrent shingles to left hip and right buttock since yesterday.

## 2023-08-11 ENCOUNTER — Telehealth (HOSPITAL_COMMUNITY): Payer: Self-pay

## 2023-08-11 MED ORDER — METOPROLOL TARTRATE 50 MG PO TABS: 100.0000 mg | ORAL_TABLET | Freq: Two times a day (BID) | ORAL | Status: DC

## 2023-08-11 NOTE — Telephone Encounter (Signed)
Patient called to let us know she has been diagnosed with Shingles. She is concerned about her blood pressure. Systolic pressure has been around 218-225 and diastolic pressure around 89. Yesterday she decided to increase her Metoprolol medication to 100 mg BID. Her blood pressure has been better controlled today - 148/64. Per Clint Fenton-patient should continue taking her Metoprolol medication 100 mg BID. Patient advised to contact our clinic back if she has further concerns. Consulted with patient and she verbalized understanding.

## 2023-09-01 ENCOUNTER — Other Ambulatory Visit (HOSPITAL_COMMUNITY): Payer: Self-pay | Admitting: *Deleted

## 2023-09-01 MED ORDER — METOPROLOL TARTRATE 100 MG PO TABS: 100.0000 mg | ORAL_TABLET | Freq: Two times a day (BID) | ORAL | 2 refills | Status: DC

## 2023-10-08 IMAGING — DX DG CHEST 1V PORT
1 series · 1 of 1 positions shown · non-contrast
Comparison: 10/23/2021

CLINICAL DATA: Shortness of breath

EXAM:
PORTABLE CHEST 1 VIEW

[chest ap]
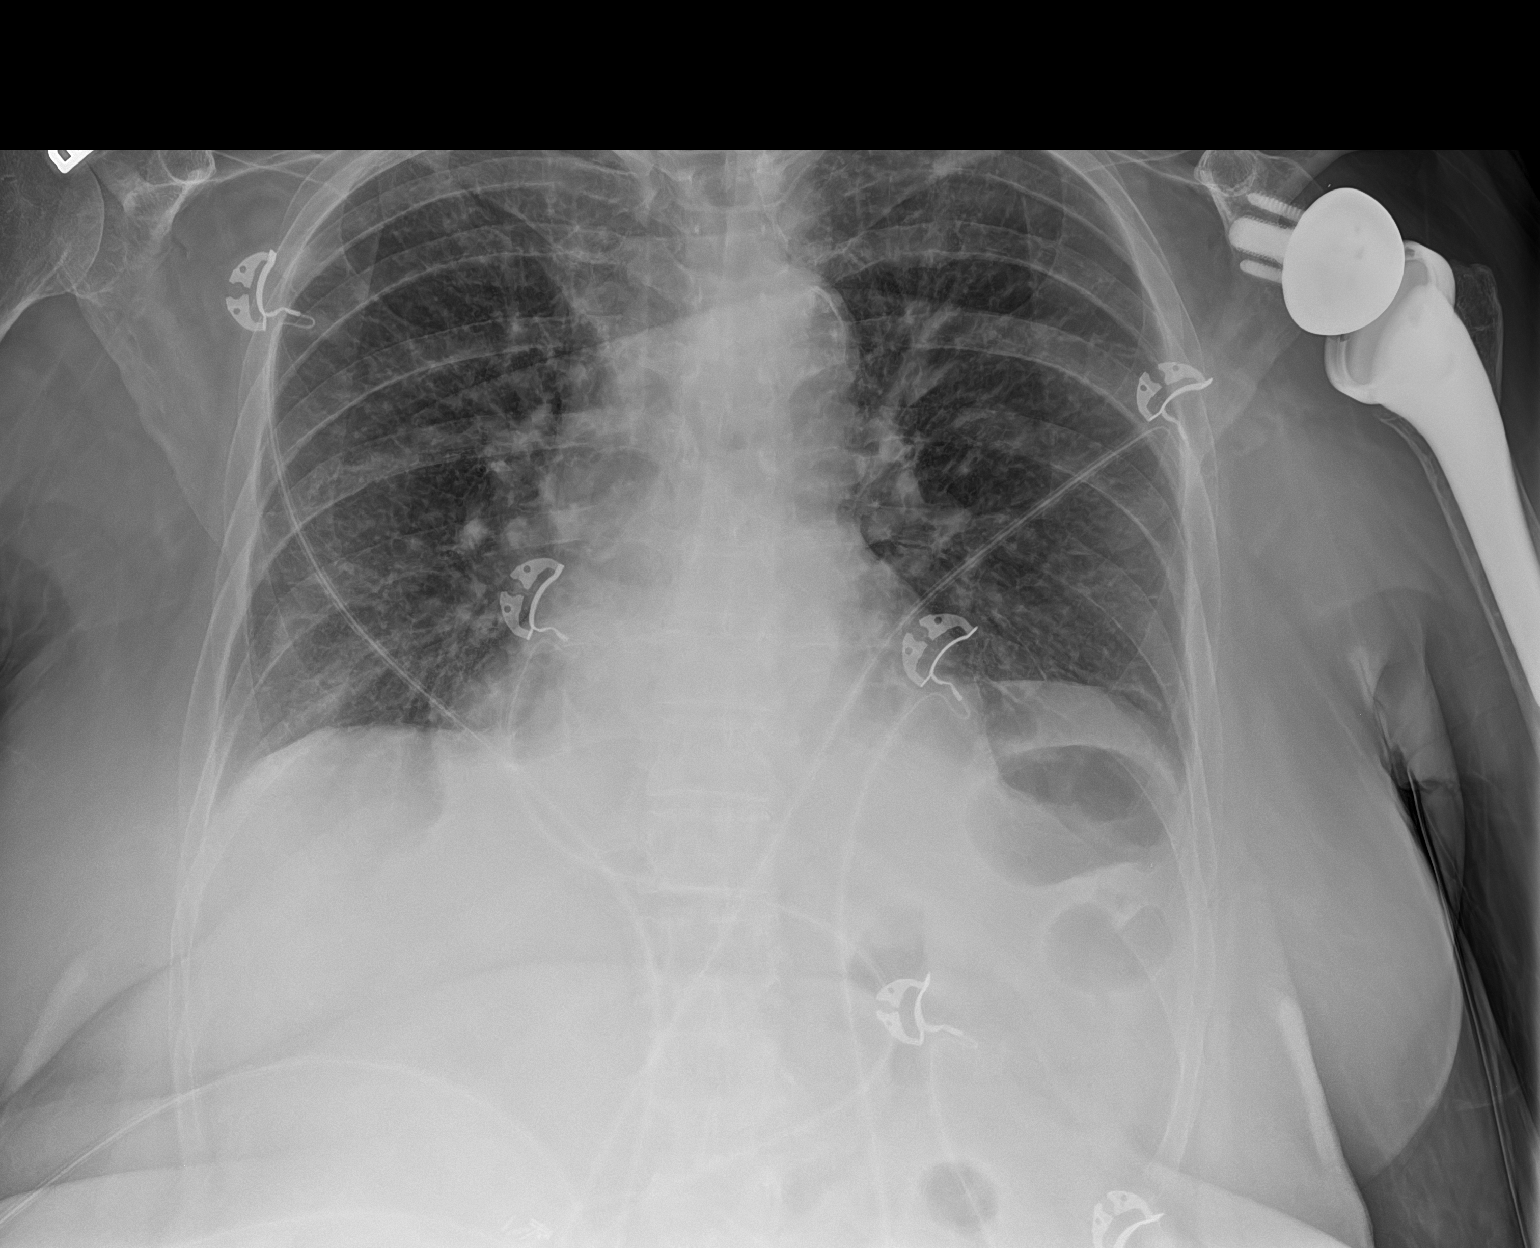

[1 of 1 positions shown; findings below may reference images not displayed]

FINDINGS: Heart is normal size. No confluent airspace opacities, effusions or
edema. No acute bony abnormality. Prior left shoulder replacement.
IMPRESSION: No acute cardiopulmonary disease.

## 2023-10-08 IMAGING — DX DG CHEST 1V PORT SAME DAY
1 series · 1 of 1 positions shown · non-contrast
Comparison: 11/08/2021

CLINICAL DATA: Shortness of breath

EXAM:
PORTABLE CHEST 1 VIEW

[chest ap]
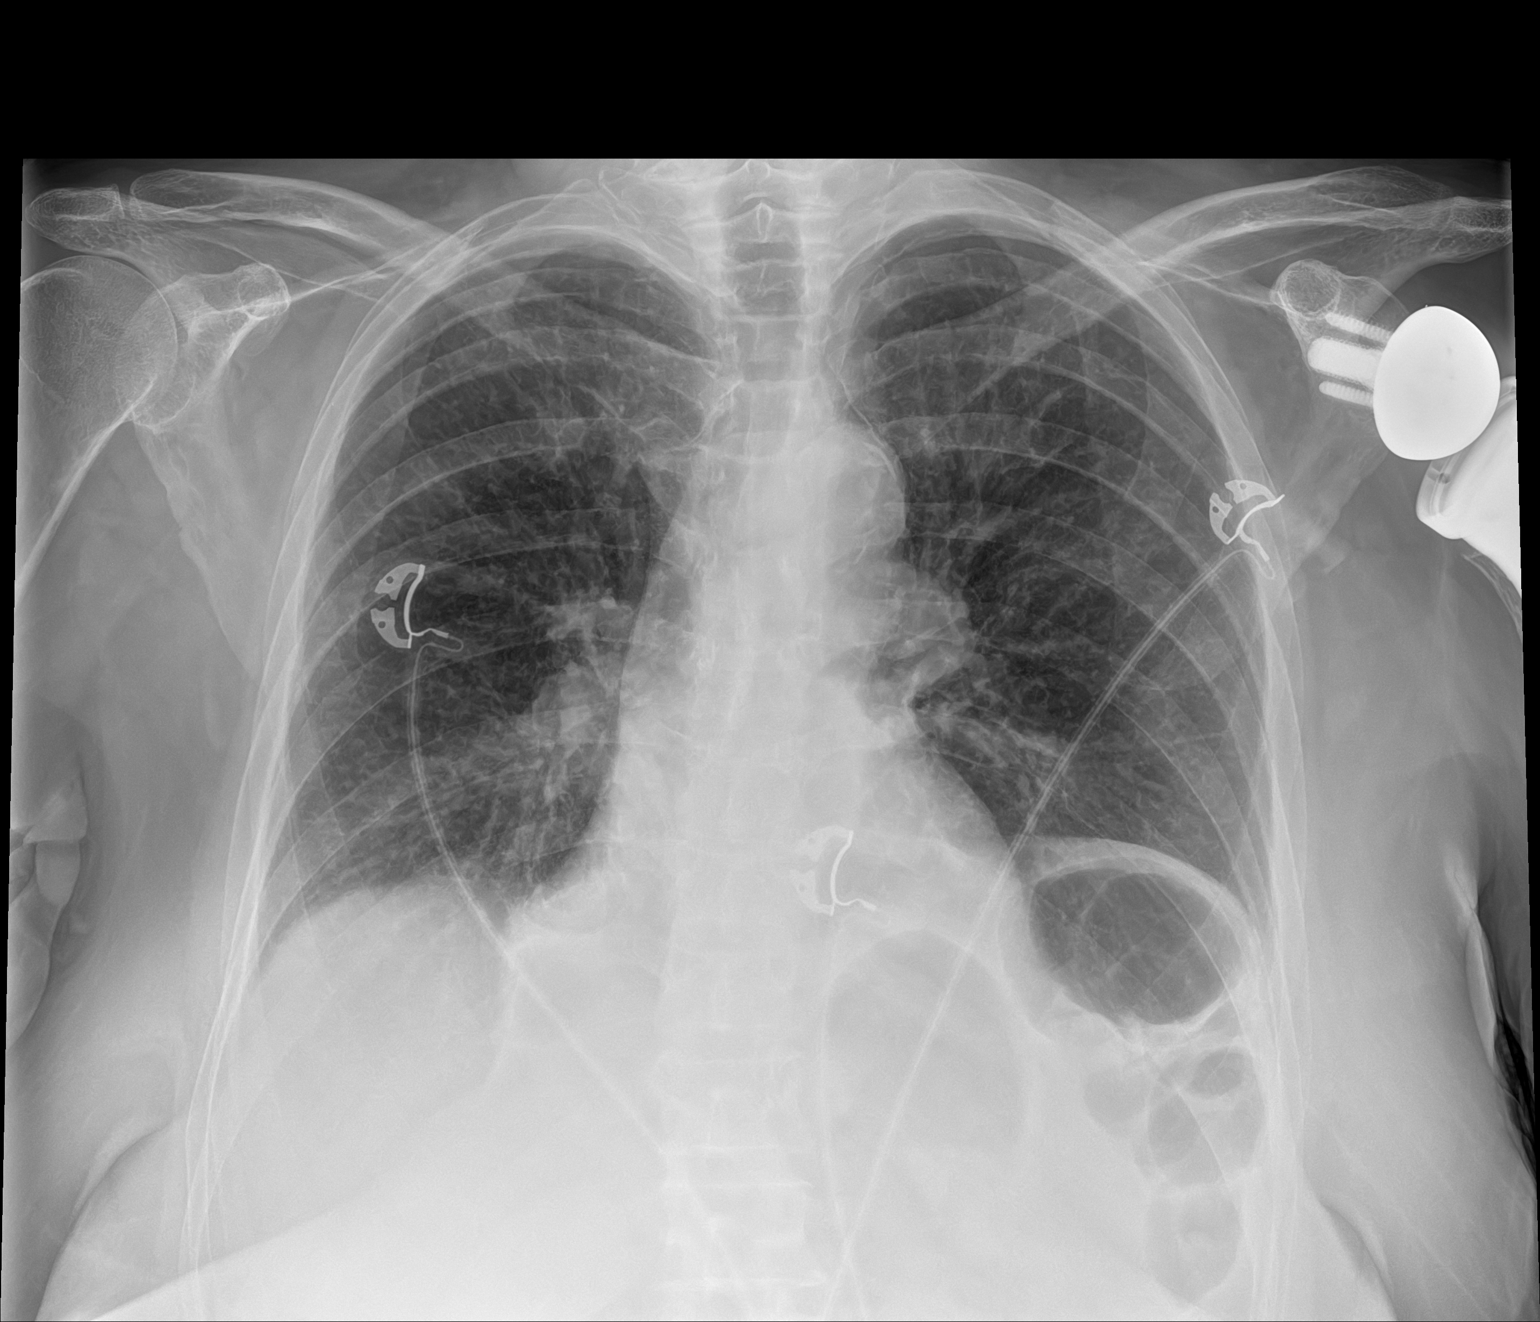

[1 of 1 positions shown; findings below may reference images not displayed]

FINDINGS: Mild bilateral interstitial thickening. No focal consolidation,
pleural effusion or pneumothorax. Stable cardiomediastinal
silhouette. Left shoulder arthroplasty. No acute osseous
abnormality.
IMPRESSION: 1. Mild bilateral interstitial thickening concerning for mild
interstitial edema.

## 2023-10-08 IMAGING — CT CT ANGIO CHEST
2 of 7 series · 18 of 46 positions shown · IV contrast (Omnipaque or Isovue)
Comparison: None.

CLINICAL DATA: Shortness of breath, neck, back, chest pain

EXAM:
CT ANGIOGRAPHY CHEST WITH CONTRAST
TECHNIQUE: Multidetector CT imaging of the chest was performed using the
standard protocol during bolus administration of intravenous
contrast. Multiplanar CT image reconstructions and MIPs were
obtained to evaluate the vascular anatomy.

[Series 5: pe axial thins · axial · 0.57mm/px · z∈[+1133,+1339]mm · 15 of 291 slices shown]
[im 17/291  lung]
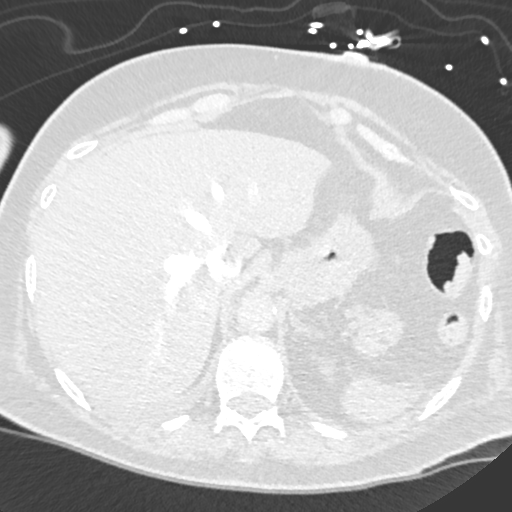
[im 33/291  soft-tissue]
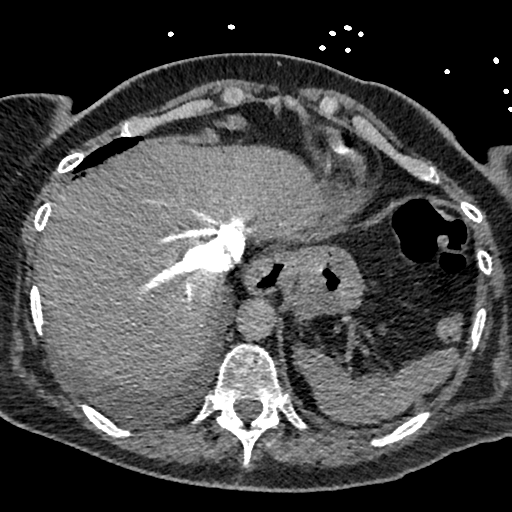
[im 49/291  lung]
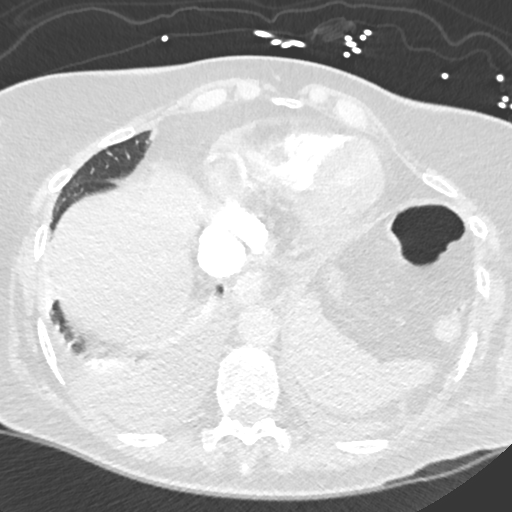
[im 65/291  soft-tissue]
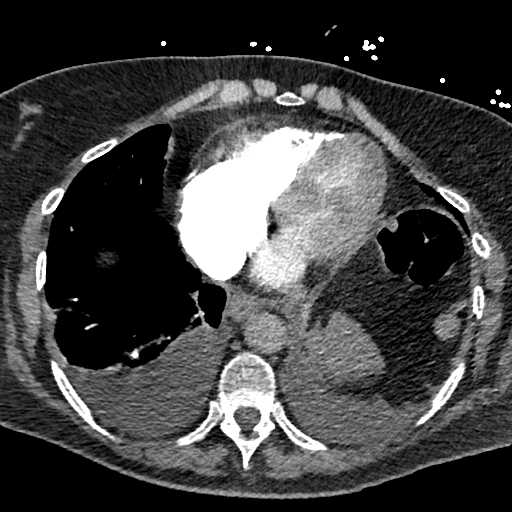
[im 97/291  lung]
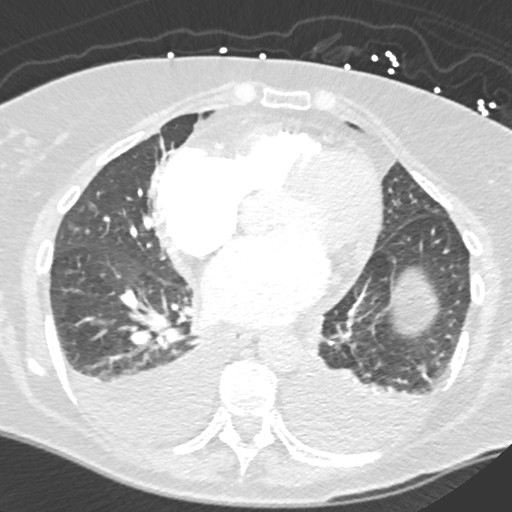
[im 113/291  soft-tissue]
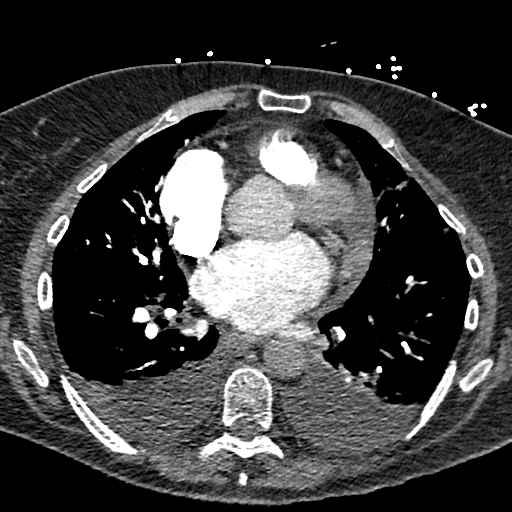
[im 129/291  lung]
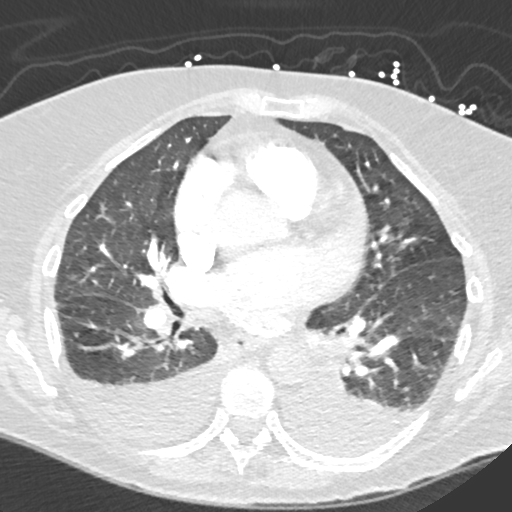
[im 146/291  soft-tissue]
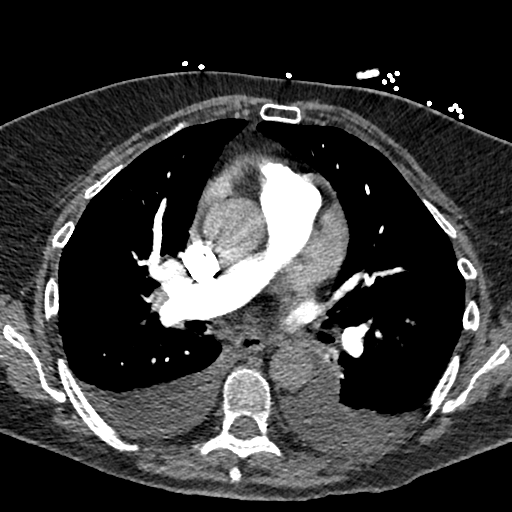
[im 162/291  lung]
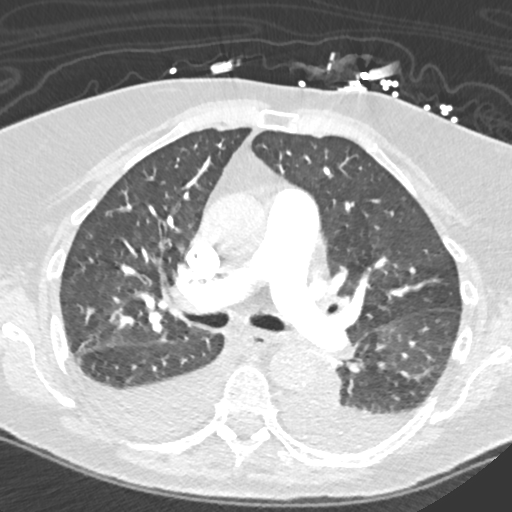
[im 178/291  soft-tissue]
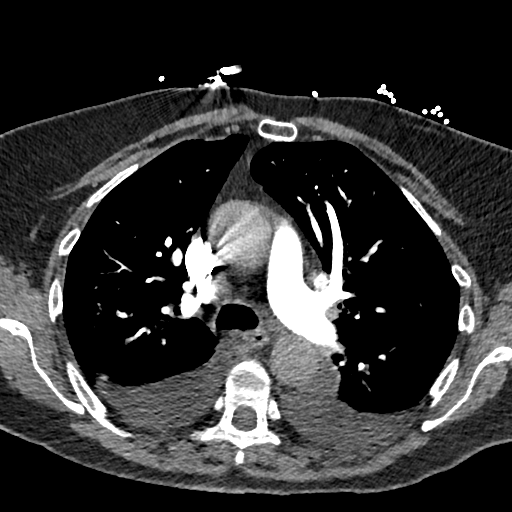
[im 194/291  lung]
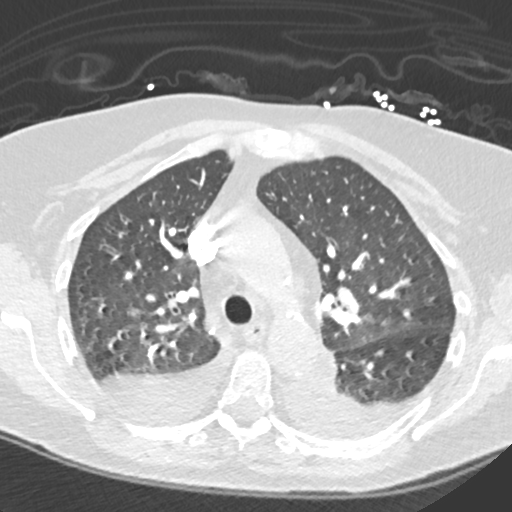
[im 226/291  soft-tissue]
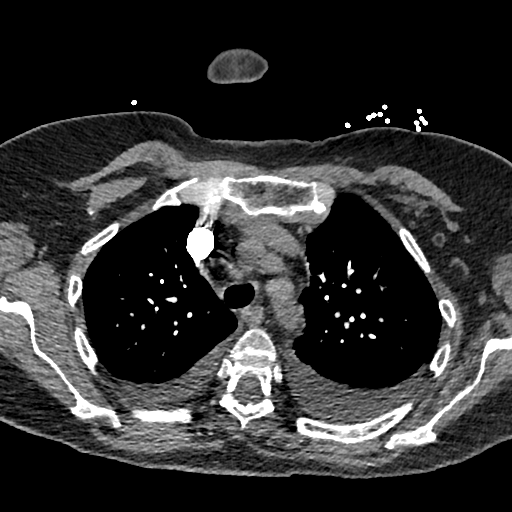
[im 242/291  lung]
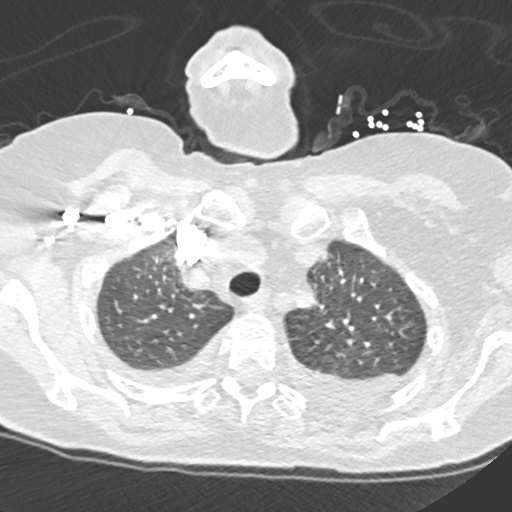
[im 258/291  soft-tissue]
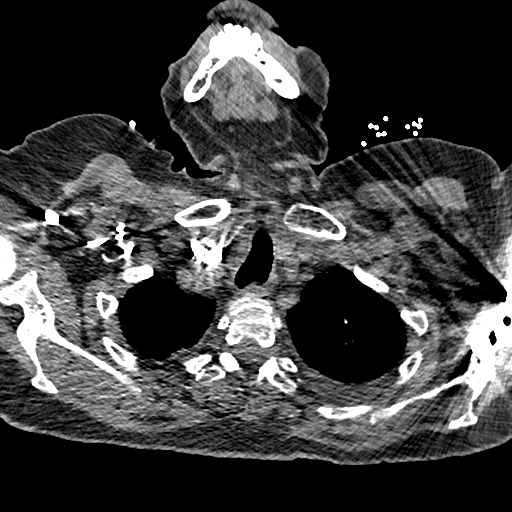
[im 274/291  lung]
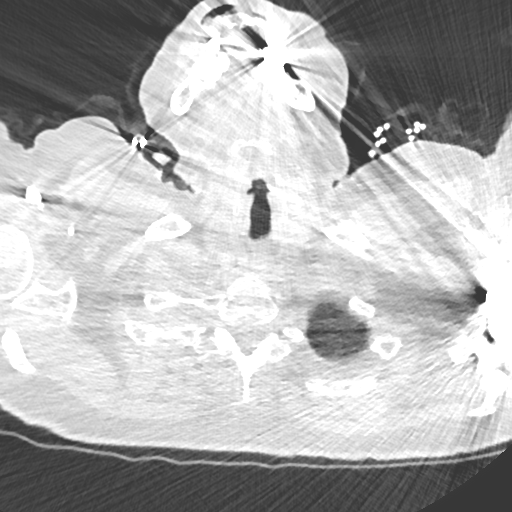

[Series 8: cor soft · coronal · 0.47mm/px · 3 of 115 slices shown]
[im 29/115  soft-tissue]
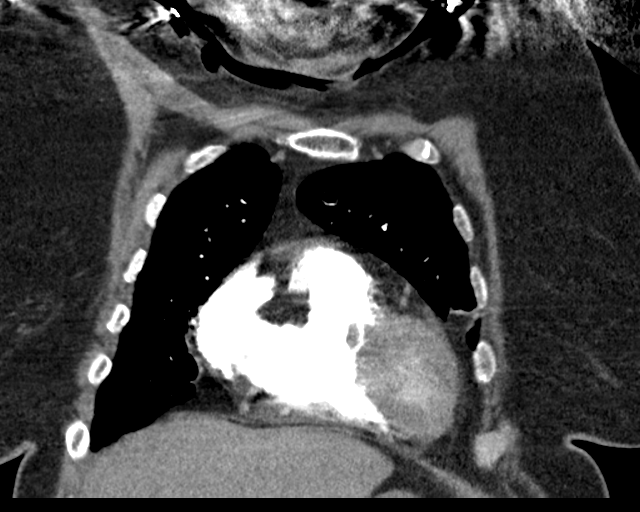
[im 58/115  soft-tissue]
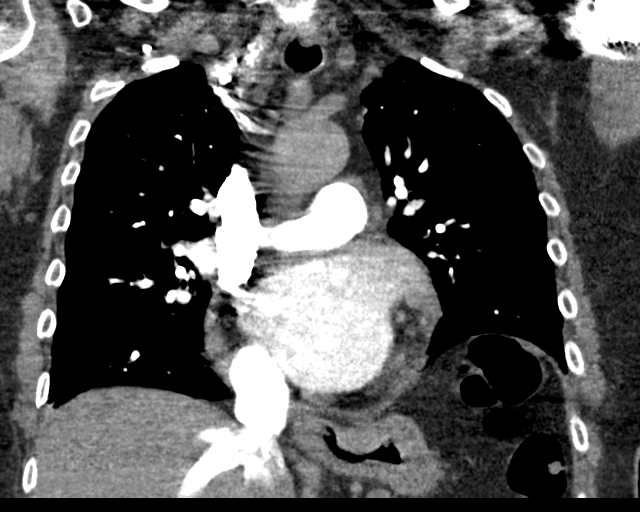
[im 86/115  soft-tissue]
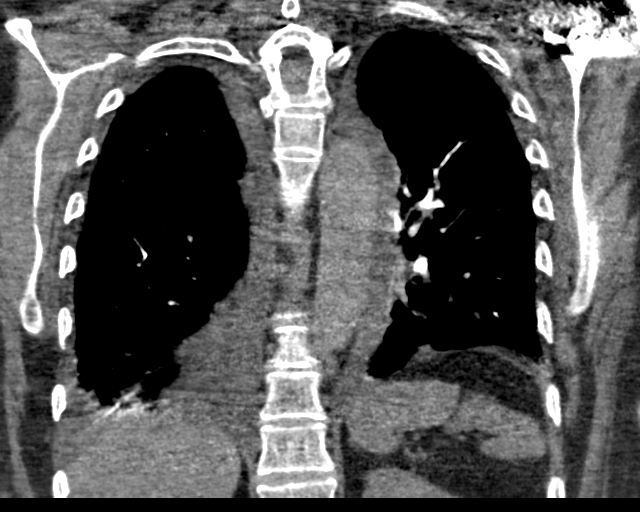

[18 of 46 positions shown; findings below may reference images not displayed]

RADIATION DOSE REDUCTION: This exam was performed according to the
departmental dose-optimization program which includes automated
exposure control, adjustment of the mA and/or kV according to
patient size and/or use of iterative reconstruction technique.

CONTRAST:  100mL OMNIPAQUE IOHEXOL 350 MG/ML SOLN
FINDINGS: Cardiovascular: No filling defects in the pulmonary arteries to
suggest pulmonary emboli. Heart is normal size. Aorta is normal
caliber. Scattered aortic calcifications.

Mediastinum/Nodes: No mediastinal, hilar, or axillary adenopathy.
Trachea and esophagus are unremarkable. Thyroid unremarkable.

Lungs/Pleura: Moderate bilateral pleural effusions. Compressive
atelectasis in the lower lobes.

Upper Abdomen: Imaging into the upper abdomen demonstrates no acute
findings.

Musculoskeletal: Chest wall soft tissues are unremarkable. No acute
bony abnormality.

Review of the MIP images confirms the above findings.
IMPRESSION: No evidence of pulmonary embolus.

Moderate bilateral pleural effusions with compressive atelectasis in
the lower lobes.

Aortic Atherosclerosis ([id number].I).

## 2023-11-18 ENCOUNTER — Telehealth (HOSPITAL_COMMUNITY): Payer: Self-pay

## 2023-11-18 NOTE — Telephone Encounter (Signed)
 Patient stated that she had an ablation in 11/24 since Easter she has been more out of rhythm. Offer an appointment today or tomorrow she stated she will come in on Monday at 2:30.

## 2023-11-23 ENCOUNTER — Encounter (HOSPITAL_COMMUNITY): Payer: Self-pay | Admitting: Physician Assistant

## 2023-11-23 ENCOUNTER — Ambulatory Visit (HOSPITAL_COMMUNITY)
Admission: RE | Admit: 2023-11-23 | Discharge: 2023-11-23 | Disposition: A | Discharge status: Home / Self Care | Source: Ambulatory Visit | Attending: Physician Assistant | Admitting: Physician Assistant

## 2023-11-23 VITALS — BP 98/80 | HR 114 | Ht 64.5 in | Wt 185.6 lb

## 2023-11-23 DIAGNOSIS — D6869 Other thrombophilia: Secondary | ICD-10-CM | POA: Diagnosis not present

## 2023-11-23 DIAGNOSIS — I4819 Other persistent atrial fibrillation: Secondary | ICD-10-CM | POA: Insufficient documentation

## 2023-11-23 DIAGNOSIS — I484 Atypical atrial flutter: Secondary | ICD-10-CM | POA: Diagnosis not present

## 2023-11-23 LAB — CBC
Hematocrit: 46 % (ref 34.0–46.6)
Hemoglobin: 15.2 g/dL (ref 11.1–15.9)
MCH: 31.7 pg (ref 26.6–33.0)
MCHC: 33 g/dL (ref 31.5–35.7)
MCV: 96 fL (ref 79–97)
Platelets: 378 10*3/uL (ref 150–450)
RBC: 4.79 x10E6/uL (ref 3.77–5.28)
RDW: 13.4 % (ref 11.7–15.4)
WBC: 9.2 10*3/uL (ref 3.4–10.8)

## 2023-11-23 LAB — BASIC METABOLIC PANEL WITH GFR
BUN/Creatinine Ratio: 12 (ref 12–28)
BUN: 15 mg/dL (ref 8–27)
CO2: 23 mmol/L (ref 20–29)
Calcium: 9.3 mg/dL (ref 8.7–10.3)
Chloride: 105 mmol/L (ref 96–106)
Creatinine, Ser: 1.21 mg/dL — ABNORMAL HIGH (ref 0.57–1.00)
Glucose: 110 mg/dL — ABNORMAL HIGH (ref 70–99)
Potassium: 4.7 mmol/L (ref 3.5–5.2)
Sodium: 140 mmol/L (ref 134–144)
eGFR: 46 mL/min/{1.73_m2} — ABNORMAL LOW (ref >59)

## 2023-11-23 NOTE — Progress Notes (Addendum)
 Primary Care Physician: Shelly Gerald, MD Primary Cardiologist: Dr Ardell Beauvais Primary Electrophysiologist: Dr Marven Slimmer Referring Physician: Dr Ardell Beauvais   Shelly Maldonado is a 78 y.o. female with a history of HTN, aortic atherosclerosis, chronic pancreatitis, ischemic enteritis, atrial fibrillation who presents for follow up in the [facility].  The patient was initially diagnosed with atrial fibrillation during hospitalization in [place] from 10/11/21-10/16/21. She was started in Eliquis  and metoprolol  at that time. She was admitted to [facility] 10/21/21 with ischemic enteritis with splenic infarct concerning for embolus as well as pneumonia. Embolic event not felt to be an Eliquis  failure as she has just started the medication. Patient has a CHADS2VASC score of 7. Plan was for TEE/DCCV after 3 weeks of anticoagulation. She was admitted for observation 11/08/21 with SOB and orthopnea, found to have bilateral pleural effusions, was diuresed. Seen by [name] on 11/18/21 and her BB was increased. She has monitored her heart rates since then which have been elevated 100-120 bpm. Her BB was further increased.   Patient is s/p DCCV on 01/02/22. TEE was not performed. Unfortunately, she went back out of rhythm on on 01/09/22 with symptoms of intermittent lightheadedness and fatigue. She was seen by Dr Marven Slimmer and underwent afib ablation 06/06/22. She did have persistent afib post ablation and had another DCCV on 07/01/22.  Patient returns for follow up for atrial fibrillation and atrial flutter. Patient reports that around one month ago she started to have symptoms of fatigue and intermittent dizziness. She has checked her [name] mobile for the past week which has shown persistent afib. No bleeding issues on anticoagulation. There were no specific triggers for her afib that she could identify.   Today, she  denies symptoms of palpitations, chest pain, shortness of breath,  orthopnea, PND, lower extremity edema, presyncope, syncope, snoring, daytime somnolence, bleeding, or neurologic sequela. The patient is tolerating medications without difficulties and is otherwise without complaint today.    Atrial Fibrillation Risk Factors:  she does not have symptoms or diagnosis of sleep apnea. Negative sleep study 01/29/23 she does not have a history of rheumatic fever. she does not have a history of alcohol use. The patient does not have a history of early familial atrial fibrillation or other arrhythmias.   Atrial Fibrillation Management history:  Previous antiarrhythmic drugs: none Previous cardioversions: 01/02/22, 07/01/22 Previous ablations: 06/06/22 Anticoagulation history: Eliquis    Past Medical History:  Diagnosis Date   Atrial fibrillation (HCC)    Hypertension    Pancreatitis    Shingles     ROS- All systems are reviewed and negative except as per the HPI above.  Physical Exam: Vitals:   11/23/23 1401  BP: 98/80  Pulse: (!) 114  Weight: 84.2 kg  Height: 5' 4.5" (1.638 m)     GEN: Well nourished, well developed in no acute distress CARDIAC: Irregularly irregular rate and rhythm, no murmurs, rubs, gallops RESPIRATORY:  Clear to auscultation without rales, wheezing or rhonchi  ABDOMEN: Soft, non-tender, non-distended EXTREMITIES:  No edema; No deformity    Wt Readings from Last 3 Encounters:  11/23/23 84.2 kg  08/07/23 81.6 kg  06/25/23 85.1 kg    EKG today demonstrates  Atypical atrial flutter with variable block Vent. rate 114 BPM PR interval * ms QRS duration 70 ms QT/QTcB 340/468 ms   Echo 10/22/21 demonstrated   1. Left ventricular ejection fraction, by estimation, is 60 to 65%. The  left ventricle has normal function. The left ventricle has  no regional  wall motion abnormalities. There is mild left ventricular hypertrophy of  the basal-septal segment. Left ventricular diastolic function could not be evaluated.   2.  Right ventricular systolic function is normal. The right ventricular  size is normal. There is moderately elevated pulmonary artery systolic  pressure. The estimated right ventricular systolic pressure is 50.1 mmHg.   3. The pericardial effusion is anterior to the right ventricle.   4. The mitral valve is normal in structure. Trivial mitral valve  regurgitation. No evidence of mitral stenosis.   5. The aortic valve is normal in structure. Aortic valve regurgitation is  not visualized. No aortic stenosis is present.   6. The inferior vena cava is normal in size with greater than 50%  respiratory variability, suggesting right atrial pressure of 3 mmHg.   Epic records are reviewed at length today  CHA2DS2-VASc Score = 5  The patient's score is based upon: CHF History: 0 HTN History: 1 Diabetes History: 0 Stroke History: 0 Vascular Disease History: 1 Age Score: 2 Gender Score: 1       ASSESSMENT AND PLAN: Persistent Atrial Fibrillation/atrial flutter The patient's CHA2DS2-VASc score is 5, indicating a 7.2% annual risk of stroke.   S/p afib ablation 06/06/22 Patient in atypical atrial flutter today, persistent on her Kardia mobile.  Continue Eliquis  5 mg BID Continue Lopressor  100 mg BID Kardia mobile for home monitoring.  Check bmet/cbc  Secondary Hypercoagulable State (ICD10:  D68.69) The patient is at significant risk for stroke/thromboembolism based upon her CHA2DS2-VASc Score of 5.  Continue Apixaban  (Eliquis ). No bleeding issues.  HTN Borderline low today, will reassess in SR.     Follow up in the AF clinic post DCCV.    Informed Consent   Shared Decision Making/Informed Consent The risks (stroke, cardiac arrhythmias rarely resulting in the need for a temporary or permanent pacemaker, skin irritation or burns and complications associated with conscious sedation including aspiration, arrhythmia, respiratory failure and death), benefits (restoration of normal sinus  rhythm) and alternatives of a direct current cardioversion were explained in detail to [name] and she agrees to proceed.       [name] PA-C [facility] [address] [place], [place] [address] [phone] 11/23/2023 2:10 PM

## 2023-11-23 NOTE — H&P (View-Only) (Signed)
 Primary Care Physician: Veda Gerald, MD Primary Cardiologist: Dr Ardell Beauvais Primary Electrophysiologist: Dr Marven Slimmer Referring Physician: Dr Ardell Beauvais   Shelly Maldonado is a 78 y.o. female with a history of HTN, aortic atherosclerosis, chronic pancreatitis, ischemic enteritis, atrial fibrillation who presents for follow up in the [facility].  The patient was initially diagnosed with atrial fibrillation during hospitalization in [place] from 10/11/21-10/16/21. She was started in Eliquis  and metoprolol  at that time. She was admitted to [facility] 10/21/21 with ischemic enteritis with splenic infarct concerning for embolus as well as pneumonia. Embolic event not felt to be an Eliquis  failure as she has just started the medication. Patient has a CHADS2VASC score of 7. Plan was for TEE/DCCV after 3 weeks of anticoagulation. She was admitted for observation 11/08/21 with SOB and orthopnea, found to have bilateral pleural effusions, was diuresed. Seen by [name] on 11/18/21 and her BB was increased. She has monitored her heart rates since then which have been elevated 100-120 bpm. Her BB was further increased.   Patient is s/p DCCV on 01/02/22. TEE was not performed. Unfortunately, she went back out of rhythm on on 01/09/22 with symptoms of intermittent lightheadedness and fatigue. She was seen by Dr Marven Slimmer and underwent afib ablation 06/06/22. She did have persistent afib post ablation and had another DCCV on 07/01/22.  Patient returns for follow up for atrial fibrillation and atrial flutter. Patient reports that around one month ago she started to have symptoms of fatigue and intermittent dizziness. She has checked her [name] mobile for the past week which has shown persistent afib. No bleeding issues on anticoagulation. There were no specific triggers for her afib that she could identify.   Today, she  denies symptoms of palpitations, chest pain, shortness of breath,  orthopnea, PND, lower extremity edema, presyncope, syncope, snoring, daytime somnolence, bleeding, or neurologic sequela. The patient is tolerating medications without difficulties and is otherwise without complaint today.    Atrial Fibrillation Risk Factors:  she does not have symptoms or diagnosis of sleep apnea. Negative sleep study 01/29/23 she does not have a history of rheumatic fever. she does not have a history of alcohol use. The patient does not have a history of early familial atrial fibrillation or other arrhythmias.   Atrial Fibrillation Management history:  Previous antiarrhythmic drugs: none Previous cardioversions: 01/02/22, 07/01/22 Previous ablations: 06/06/22 Anticoagulation history: Eliquis    Past Medical History:  Diagnosis Date   Atrial fibrillation (HCC)    Hypertension    Pancreatitis    Shingles     ROS- All systems are reviewed and negative except as per the HPI above.  Physical Exam: Vitals:   11/23/23 1401  BP: 98/80  Pulse: (!) 114  Weight: 84.2 kg  Height: 5' 4.5" (1.638 m)     GEN: Well nourished, well developed in no acute distress CARDIAC: Irregularly irregular rate and rhythm, no murmurs, rubs, gallops RESPIRATORY:  Clear to auscultation without rales, wheezing or rhonchi  ABDOMEN: Soft, non-tender, non-distended EXTREMITIES:  No edema; No deformity    Wt Readings from Last 3 Encounters:  11/23/23 84.2 kg  08/07/23 81.6 kg  06/25/23 85.1 kg    EKG today demonstrates  Atypical atrial flutter with variable block Vent. rate 114 BPM PR interval * ms QRS duration 70 ms QT/QTcB 340/468 ms   Echo 10/22/21 demonstrated   1. Left ventricular ejection fraction, by estimation, is 60 to 65%. The  left ventricle has normal function. The left ventricle has  no regional  wall motion abnormalities. There is mild left ventricular hypertrophy of  the basal-septal segment. Left ventricular diastolic function could not be evaluated.   2.  Right ventricular systolic function is normal. The right ventricular  size is normal. There is moderately elevated pulmonary artery systolic  pressure. The estimated right ventricular systolic pressure is 50.1 mmHg.   3. The pericardial effusion is anterior to the right ventricle.   4. The mitral valve is normal in structure. Trivial mitral valve  regurgitation. No evidence of mitral stenosis.   5. The aortic valve is normal in structure. Aortic valve regurgitation is  not visualized. No aortic stenosis is present.   6. The inferior vena cava is normal in size with greater than 50%  respiratory variability, suggesting right atrial pressure of 3 mmHg.   Epic records are reviewed at length today  CHA2DS2-VASc Score = 5  The patient's score is based upon: CHF History: 0 HTN History: 1 Diabetes History: 0 Stroke History: 0 Vascular Disease History: 1 Age Score: 2 Gender Score: 1       ASSESSMENT AND PLAN: Persistent Atrial Fibrillation/atrial flutter The patient's CHA2DS2-VASc score is 5, indicating a 7.2% annual risk of stroke.   S/p afib ablation 06/06/22 Patient in atypical atrial flutter today, persistent on her Kardia mobile.  Continue Eliquis  5 mg BID Continue Lopressor  100 mg BID Kardia mobile for home monitoring.  Check bmet/cbc  Secondary Hypercoagulable State (ICD10:  D68.69) The patient is at significant risk for stroke/thromboembolism based upon her CHA2DS2-VASc Score of 5.  Continue Apixaban  (Eliquis ). No bleeding issues.  HTN Borderline low today, will reassess in SR.     Follow up in the AF clinic post DCCV.    Informed Consent   Shared Decision Making/Informed Consent The risks (stroke, cardiac arrhythmias rarely resulting in the need for a temporary or permanent pacemaker, skin irritation or burns and complications associated with conscious sedation including aspiration, arrhythmia, respiratory failure and death), benefits (restoration of normal sinus  rhythm) and alternatives of a direct current cardioversion were explained in detail to Ms. Herget and she agrees to proceed.       [name] PA-C Afib Clinic [facility] [address] [place], [place] [address] [phone] 11/23/2023 2:10 PM

## 2023-11-23 NOTE — Patient Instructions (Signed)
 Cardioversion scheduled for: Wednesday, May 14th   - Arrive at the [employer] [facility]A" of [facility] ([address])  and check in with ADMITTING at 10:00AM   - Do not eat or drink anything after midnight the night prior to your procedure.   - Take all your morning medication (except diabetic medications) with a sip of water  prior to arrival.  - Do NOT miss any doses of your blood thinner - if you should miss a dose or take a dose more than 4 hours late -- please notify our office immediately.  - You will not be able to drive home after your procedure. Please ensure you have a responsible adult to drive you home. You will need someone with you for 24 hours post procedure.     - Expect to be in the procedural area approximately 2 hours.   - If you feel as if you go back into normal rhythm prior to scheduled cardioversion, please notify our office immediately.   If your procedure is canceled in the cardioversion suite you will be charged a cancellation fee.

## 2023-11-24 ENCOUNTER — Ambulatory Visit (HOSPITAL_COMMUNITY): Payer: Self-pay | Admitting: Physician Assistant

## 2023-11-24 NOTE — Progress Notes (Signed)
 Spoke to patient and instructed them to come at 09:00  and to be NPO after 0000. Medications reviewed.   Confirmed that patient will have a ride home and someone to stay with them for 24 hours after the procedure.

## 2023-11-25 ENCOUNTER — Other Ambulatory Visit: Payer: Self-pay

## 2023-11-25 ENCOUNTER — Encounter (HOSPITAL_COMMUNITY)
Admission: RE | Disposition: A | Discharge status: Home / Self Care | Payer: Self-pay | Source: Home / Self Care | Attending: Cardiology

## 2023-11-25 ENCOUNTER — Ambulatory Visit (HOSPITAL_COMMUNITY)
Admission: RE | Admit: 2023-11-25 | Discharge: 2023-11-25 | Disposition: A | Discharge status: Home / Self Care | Attending: Cardiology | Admitting: Cardiology

## 2023-11-25 ENCOUNTER — Ambulatory Visit (HOSPITAL_COMMUNITY): Payer: Self-pay

## 2023-11-25 ENCOUNTER — Encounter (HOSPITAL_COMMUNITY): Payer: Self-pay | Admitting: Cardiology

## 2023-11-25 DIAGNOSIS — I484 Atypical atrial flutter: Secondary | ICD-10-CM

## 2023-11-25 DIAGNOSIS — I129 Hypertensive chronic kidney disease with stage 1 through stage 4 chronic kidney disease, or unspecified chronic kidney disease: Secondary | ICD-10-CM | POA: Diagnosis not present

## 2023-11-25 DIAGNOSIS — I700 Atherosclerosis of aorta: Secondary | ICD-10-CM | POA: Insufficient documentation

## 2023-11-25 DIAGNOSIS — Z7901 Long term (current) use of anticoagulants: Secondary | ICD-10-CM | POA: Diagnosis not present

## 2023-11-25 DIAGNOSIS — Z87891 Personal history of nicotine dependence: Secondary | ICD-10-CM | POA: Diagnosis not present

## 2023-11-25 DIAGNOSIS — I10 Essential (primary) hypertension: Secondary | ICD-10-CM | POA: Insufficient documentation

## 2023-11-25 DIAGNOSIS — N1831 Chronic kidney disease, stage 3a: Secondary | ICD-10-CM

## 2023-11-25 DIAGNOSIS — I4891 Unspecified atrial fibrillation: Secondary | ICD-10-CM | POA: Diagnosis not present

## 2023-11-25 DIAGNOSIS — Z79899 Other long term (current) drug therapy: Secondary | ICD-10-CM | POA: Diagnosis not present

## 2023-11-25 DIAGNOSIS — D6869 Other thrombophilia: Secondary | ICD-10-CM | POA: Insufficient documentation

## 2023-11-25 DIAGNOSIS — I4819 Other persistent atrial fibrillation: Secondary | ICD-10-CM | POA: Diagnosis not present

## 2023-11-25 HISTORY — PX: CARDIOVERSION: EP1203

## 2023-11-25 SURGERY — CARDIOVERSION (CATH LAB)
Anesthesia: General

## 2023-11-25 MED ORDER — PROPOFOL 10 MG/ML IV BOLUS
INTRAVENOUS | Status: DC | PRN
  Administered 2023-11-25: 60 mg via INTRAVENOUS

## 2023-11-25 MED ORDER — LIDOCAINE 2% (20 MG/ML) 5 ML SYRINGE
INTRAMUSCULAR | Status: DC | PRN
Start: 2023-11-25 — End: 2023-11-25
  Administered 2023-11-25 (×2): 20 mg via INTRAVENOUS

## 2023-11-25 MED ORDER — SODIUM CHLORIDE 0.9% FLUSH
3.0000 mL | INTRAVENOUS | Status: DC | PRN
Start: 2023-11-25 — End: 2023-11-25

## 2023-11-25 MED ORDER — SODIUM CHLORIDE 0.9% FLUSH: 3.0000 mL | Freq: Two times a day (BID) | INTRAVENOUS | Status: DC

## 2023-11-25 MED ORDER — EPHEDRINE SULFATE (PRESSORS) 50 MG/ML IJ SOLN
INTRAMUSCULAR | Status: DC | PRN
  Administered 2023-11-25: 7.5 mg via INTRAVENOUS

## 2023-11-25 SURGICAL SUPPLY — 1 items: PAD DEFIB RADIO PHYSIO CONN (PAD) ×1 IMPLANT

## 2023-11-25 NOTE — Anesthesia Preprocedure Evaluation (Signed)
 Anesthesia Evaluation  Patient identified by MRN, date of birth, ID band Patient awake    Reviewed: Allergy & Precautions, NPO status , Patient's Chart, lab work & pertinent test results, reviewed documented beta blocker date and time   History of Anesthesia Complications Negative for: history of anesthetic complications  Airway Mallampati: II  TM Distance: >3 FB Neck ROM: Full    Dental  (+) Edentulous Upper, Upper Dentures, Chipped, Dental Advisory Given   Pulmonary former smoker   breath sounds clear to auscultation       Cardiovascular hypertension, Pt. on medications and Pt. on home beta blockers + dysrhythmias Atrial Fibrillation  Rhythm:Irregular Rate:Normal  '23 ECHO: EF  60 to 65%.  1.The LV has normal function, no regional wall motion abnormalities. There is mild left ventricular hypertrophy of the basal-septal segment.   2. RVF is normal. The right ventricular size is normal. There is moderately elevated pulmonary artery systolic pressure. The estimated right ventricular systolic pressure is 50.1 mmHg.   3. The pericardial effusion is anterior to the right ventricle.   4. The mitral valve is normal in structure. Trivial mitral valve regurgitation. No evidence of mitral stenosis.   5. The aortic valve is normal in structure. Aortic valve regurgitation is  not visualized. No aortic stenosis is present.     Neuro/Psych negative neurological ROS     GI/Hepatic negative GI ROS, Neg liver ROS,,,  Endo/Other  BMI 32  Renal/GU Renal InsufficiencyRenal disease     Musculoskeletal   Abdominal   Peds  Hematology eliquis    Anesthesia Other Findings   Reproductive/Obstetrics                              Anesthesia Physical Anesthesia Plan  ASA: 3  Anesthesia Plan: General   Post-op Pain Management: Minimal or no pain anticipated   Induction: Intravenous  PONV Risk Score and Plan: 3  and Treatment may vary due to age or medical condition  Airway Management Planned: Natural Airway and Nasal Cannula  Additional Equipment: None  Intra-op Plan:   Post-operative Plan:   Informed Consent: I have reviewed the patients History and Physical, chart, labs and discussed the procedure including the risks, benefits and alternatives for the proposed anesthesia with the patient or authorized representative who has indicated his/her understanding and acceptance.     Dental advisory given  Plan Discussed with: CRNA and Surgeon  Anesthesia Plan Comments:          Anesthesia Quick Evaluation

## 2023-11-25 NOTE — Anesthesia Postprocedure Evaluation (Signed)
 Anesthesia Post Note  Patient: Shelly Maldonado  Procedure(s) Performed: CARDIOVERSION     Patient location during evaluation: Cath Lab Anesthesia Type: General Level of consciousness: awake and alert, oriented and patient cooperative Pain management: pain level controlled Vital Signs Assessment: post-procedure vital signs reviewed and stable Respiratory status: spontaneous breathing, nonlabored ventilation and respiratory function stable Cardiovascular status: blood pressure returned to baseline and stable Postop Assessment: no apparent nausea or vomiting and able to ambulate Anesthetic complications: no   No notable events documented.  Last Vitals:  Vitals:   11/25/23 1029 11/25/23 1039  BP: 127/77 137/81  Pulse: 67 62  Resp: 16 20  Temp:    SpO2: 93% 95%    Last Pain:  Vitals:   11/25/23 1039  TempSrc:   PainSc: 0-No pain                 Jalani Cullifer,E. Irva Loser

## 2023-11-25 NOTE — Interval H&P Note (Signed)
 History and Physical Interval Note:  11/25/2023 12:41 PM  Shelly Maldonado  has presented today for surgery, with the diagnosis of AFIB.  The various methods of treatment have been discussed with the patient and family. After consideration of risks, benefits and other options for treatment, the patient has consented to  Procedure(s): CARDIOVERSION (N/A) as a surgical intervention.  The patient's history has been reviewed, patient examined, no change in status, stable for surgery.  I have reviewed the patient's chart and labs.  Questions were answered to the patient's satisfaction.     Zahava Quant [name]

## 2023-11-25 NOTE — Transfer of Care (Signed)
 Immediate Anesthesia Transfer of Care Note  Patient: Shelly Maldonado  Procedure(s) Performed: CARDIOVERSION  Patient Location: PACU and Cath Lab  Anesthesia Type:MAC  Level of Consciousness: awake  Airway & Oxygen Therapy: Patient Spontanous Breathing and Patient connected to nasal cannula oxygen  Post-op Assessment: Report given to RN and Post -op Vital signs reviewed and stable  Post vital signs: Reviewed and stable  Last Vitals:  Vitals Value Taken Time  BP 128/75 11/25/23 1020  Temp    Pulse 70 11/25/23 1020  Resp 23 11/25/23 1020  SpO2 97% 11/25/23 1020    Last Pain:  Vitals:   11/25/23 0843  TempSrc: Tympanic  PainSc: 0-No pain         Complications: No notable events documented.

## 2023-11-25 NOTE — Discharge Instructions (Signed)

## 2023-11-25 NOTE — CV Procedure (Signed)
 Procedure:   DCCV  Indication:  Symptomatic atrial fibrillation/atypical atrial flutter  Procedure Note:  The patient signed informed consent.  They have had had therapeutic anticoagulation with apixaban  greater than 3 weeks.  Anesthesia was administered by Dr. Cleora Daft.  Patient received 40 mg IV lidocaine  and 60 mg IV propofol .Adequate airway was maintained throughout and vital followed per protocol.  They were cardioverted x 1 with 200J of biphasic synchronized energy.  They converted to NSR.  There were no apparent complications.  The patient had normal neuro status and respiratory status post procedure with vitals stable as recorded elsewhere.    Follow up:  They will continue on current medical therapy and follow up with cardiology as scheduled.  Sheryle Donning, MD PhD 11/25/2023 10:10 AM

## 2023-12-21 ENCOUNTER — Encounter (HOSPITAL_COMMUNITY): Payer: Self-pay | Admitting: Physician Assistant

## 2023-12-21 ENCOUNTER — Ambulatory Visit (HOSPITAL_COMMUNITY)
Admission: RE | Admit: 2023-12-21 | Discharge: 2023-12-21 | Disposition: A | Discharge status: Home / Self Care | Source: Ambulatory Visit | Attending: Physician Assistant | Admitting: Physician Assistant

## 2023-12-21 VITALS — BP 116/76 | HR 52 | Ht 64.0 in | Wt 181.6 lb

## 2023-12-21 DIAGNOSIS — D6869 Other thrombophilia: Secondary | ICD-10-CM | POA: Diagnosis not present

## 2023-12-21 DIAGNOSIS — Z7901 Long term (current) use of anticoagulants: Secondary | ICD-10-CM | POA: Diagnosis not present

## 2023-12-21 DIAGNOSIS — I10 Essential (primary) hypertension: Secondary | ICD-10-CM | POA: Diagnosis not present

## 2023-12-21 DIAGNOSIS — R5383 Other fatigue: Secondary | ICD-10-CM | POA: Insufficient documentation

## 2023-12-21 DIAGNOSIS — I4819 Other persistent atrial fibrillation: Secondary | ICD-10-CM | POA: Insufficient documentation

## 2023-12-21 DIAGNOSIS — I4892 Unspecified atrial flutter: Secondary | ICD-10-CM | POA: Diagnosis not present

## 2023-12-21 DIAGNOSIS — R001 Bradycardia, unspecified: Secondary | ICD-10-CM | POA: Insufficient documentation

## 2023-12-21 MED ORDER — METOPROLOL TARTRATE 50 MG PO TABS: 75.0000 mg | ORAL_TABLET | Freq: Two times a day (BID) | ORAL | 2 refills | Status: DC

## 2023-12-21 NOTE — Patient Instructions (Signed)
 Stop 100 mg metoprolol    Start 75mg  metoprolol  twice a day

## 2023-12-21 NOTE — Progress Notes (Signed)
 Primary Care Physician: Veda Gerald, MD Primary Cardiologist: Dr Ardell Beauvais Primary Electrophysiologist: Dr Marven Slimmer Referring Physician: Dr Ardell Beauvais   Shelly Maldonado is a 78 y.o. female with a history of HTN, aortic atherosclerosis, chronic pancreatitis, ischemic enteritis, atrial fibrillation who presents for follow up in the [facility].  The patient was initially diagnosed with atrial fibrillation during hospitalization in [place] from 10/11/21-10/16/21. She was started in Eliquis  and metoprolol  at that time. She was admitted to [facility] 10/21/21 with ischemic enteritis with splenic infarct concerning for embolus as well as pneumonia. Embolic event not felt to be an Eliquis  failure as she has just started the medication. Patient has a CHADS2VASC score of 7. Plan was for TEE/DCCV after 3 weeks of anticoagulation. She was admitted for observation 11/08/21 with SOB and orthopnea, found to have bilateral pleural effusions, was diuresed. Seen by [name] on 11/18/21 and her BB was increased. She has monitored her heart rates since then which have been elevated 100-120 bpm. Her BB was further increased.   Patient is s/p DCCV on 01/02/22. TEE was not performed. Unfortunately, she went back out of rhythm on on 01/09/22 with symptoms of intermittent lightheadedness and fatigue. She was seen by Dr Marven Slimmer and underwent afib ablation 06/06/22. She did have persistent afib post ablation and had another DCCV on 07/01/22.  Patient returns for follow up for atrial fibrillation. She is s/p DCCV 11/25/23. She is in SR today. She has noted some heart rates in the 40's at home associated with fatigue. No bleeding issues on anticoagulation.   Today, she  denies symptoms of palpitations, chest pain, shortness of breath, orthopnea, PND, lower extremity edema, dizziness, presyncope, syncope, snoring, daytime somnolence, bleeding, or neurologic sequela. The patient is tolerating medications  without difficulties and is otherwise without complaint today.    Atrial Fibrillation Risk Factors:  she does not have symptoms or diagnosis of sleep apnea. Negative sleep study 01/29/23 she does not have a history of rheumatic fever. she does not have a history of alcohol use. The patient does not have a history of early familial atrial fibrillation or other arrhythmias.   Atrial Fibrillation Management history:  Previous antiarrhythmic drugs: none Previous cardioversions: 01/02/22, 07/01/22 Previous ablations: 06/06/22 Anticoagulation history: Eliquis    Past Medical History:  Diagnosis Date   Atrial fibrillation (HCC)    Hypertension    Pancreatitis    Shingles     ROS- All systems are reviewed and negative except as per the HPI above.  Physical Exam: Vitals:   12/21/23 1426  BP: 116/76  Pulse: (!) 52  Weight: 82.4 kg  Height: 5\' 4"  (1.626 m)     GEN: Well nourished, well developed in no acute distress CARDIAC: Regular rate and rhythm, no murmurs, rubs, gallops RESPIRATORY:  Clear to auscultation without rales, wheezing or rhonchi  ABDOMEN: Soft, non-tender, non-distended EXTREMITIES:  No edema; No deformity    Wt Readings from Last 3 Encounters:  12/21/23 82.4 kg  11/25/23 84.8 kg  11/23/23 84.2 kg    EKG today demonstrates  SB Vent. rate 52 BPM PR interval 152 ms QRS duration 82 ms QT/QTcB 424/394 ms   Echo 10/22/21 demonstrated   1. Left ventricular ejection fraction, by estimation, is 60 to 65%. The  left ventricle has normal function. The left ventricle has no regional  wall motion abnormalities. There is mild left ventricular hypertrophy of  the basal-septal segment. Left ventricular diastolic function could not be evaluated.   2.  Right ventricular systolic function is normal. The right ventricular  size is normal. There is moderately elevated pulmonary artery systolic  pressure. The estimated right ventricular systolic pressure is 50.1 mmHg.    3. The pericardial effusion is anterior to the right ventricle.   4. The mitral valve is normal in structure. Trivial mitral valve  regurgitation. No evidence of mitral stenosis.   5. The aortic valve is normal in structure. Aortic valve regurgitation is  not visualized. No aortic stenosis is present.   6. The inferior vena cava is normal in size with greater than 50%  respiratory variability, suggesting right atrial pressure of 3 mmHg.   Epic records are reviewed at length today   CHA2DS2-VASc Score = 5  The patient's score is based upon: CHF History: 0 HTN History: 1 Diabetes History: 0 Stroke History: 0 Vascular Disease History: 1 Age Score: 2 Gender Score: 1       ASSESSMENT AND PLAN: Persistent Atrial Fibrillation/atrial flutter The patient's CHA2DS2-VASc score is 5, indicating a 7.2% annual risk of stroke.   S/p afib ablation 06/06/22 S/p DCCV 11/25/23 Patient appears to be maintaining SR Continue Eliquis  5 mg BID Decrease Lopressor  to 75 mg BID given bradycardia and fatigue. Recall she became hypertensive on 50 mg BID.  Kardia mobile for home monitoring.   Secondary Hypercoagulable State (ICD10:  D68.69) The patient is at significant risk for stroke/thromboembolism based upon her CHA2DS2-VASc Score of 5.  Continue Apixaban  (Eliquis ). No bleeding issues.   HTN Stable on current regimen    Follow up in the AF clinic in 3 months.     [name] PA-C [facility] [address] [place], [place] [address] [phone] 12/21/2023 3:33 PM

## 2023-12-24 ENCOUNTER — Ambulatory Visit (HOSPITAL_COMMUNITY): Payer: Medicare Other | Admitting: Physician Assistant

## 2024-01-14 DIAGNOSIS — N390 Urinary tract infection, site not specified: Secondary | ICD-10-CM | POA: Diagnosis not present

## 2024-01-14 DIAGNOSIS — R300 Dysuria: Secondary | ICD-10-CM | POA: Diagnosis not present

## 2024-01-23 NOTE — Progress Notes (Unsigned)
 Cardiology Office Note    Date:  01/26/2024  ID:  Shelly Maldonado, DOB December 14, 1945, MRN [id number] Cardiologist: Previously Dr. Hobart EP: Dr. Cindie  History of Present Illness:    Shelly Maldonado is a 78 y.o. female with past medical history of persistent atrial fibrillation (s/p DCCV in 12/2021 with recurrence later that month, s/p ablation in 05/2022 with recurrence in 06/2022 and underwent repeat DCCV), prior ischemic enteritis and splenic infarct, HTN and chronic pancreatitis who presents to the office today for routine follow-up.  She was examined by myself in 01/2023 and had been experiencing intermittent episodes of atrial fibrillation which had been noted on a Kardia device. She wished to avoid a repeat ablation or additional medical therapy at that time, therefore she was continued on Lopressor  100 mg twice daily along with Eliquis  5 mg twice daily for anticoagulation. It had previously been recommended by Hematology to continue Eliquis  indefinitely given her history of ischemic enteritis and splenic infarcts.  She did follow-up with Atrial Fibrillation Clinic in 11/2023 and her Kardia device had been showing more frequent episodes of persistent atrial fibrillation for at least the past week. EKG at that time was most consistent with atypical atrial flutter. Outpatient DCCV was arranged and she underwent successful DCCV on 11/25/2023 with return to normal sinus rhythm. She did follow-up with Atrial Fibrillation Clinic again on 12/21/2023 and denied any recent symptoms but her heart rate had been in the 40's at times when checked at home. Therefore, Lopressor  was reduced to 75 mg twice daily (previously became hypertensive on 50 mg twice daily).  In talking with the patient today, she reports significant improvement in her shortness of breath and dizziness since undergoing cardioversion in 11/2023. She has been checking her heart rate and EKG on her Kardia monitor and has been maintaining  normal sinus rhythm since. She has been checking her blood pressure with a smart watch and when checking against ours today, this was significantly inaccurate as her SBP was read at 109 on her watch when BP was at 160/98 when checked manually. She did not take her medications this morning prior to her visit. She denies any specific orthopnea, PND or pitting edema. No recent chest pain or palpitations. Remains on Eliquis  for anticoagulation with no reports of melena, hematochezia or hematuria.  Studies Reviewed:   EKG: EKG is not ordered today.  Echocardiogram: 10/2021 IMPRESSIONS     1. Left ventricular ejection fraction, by estimation, is 60 to 65%. The  left ventricle has normal function. The left ventricle has no regional  wall motion abnormalities. There is mild left ventricular hypertrophy of  the basal-septal segment. Left  ventricular diastolic function could not be evaluated.   2. Right ventricular systolic function is normal. The right ventricular  size is normal. There is moderately elevated pulmonary artery systolic  pressure. The estimated right ventricular systolic pressure is 50.1 mmHg.   3. The pericardial effusion is anterior to the right ventricle.   4. The mitral valve is normal in structure. Trivial mitral valve  regurgitation. No evidence of mitral stenosis.   5. The aortic valve is normal in structure. Aortic valve regurgitation is  not visualized. No aortic stenosis is present.   6. The inferior vena cava is normal in size with greater than 50%  respiratory variability, suggesting right atrial pressure of 3 mmHg.   Cardiac CTA Morph: 05/2022 IMPRESSION: 1. There is normal pulmonary vein drainage into the left atrium with ostial measurements above.  2. There is no thrombus in the left atrial appendage on delayed contrast imaging.   3. The esophagus runs in the left atrial midline and is not in proximity to any of the pulmonary vein ostia.   4. No PFO/ASD.    5. Normal coronary origin. Right dominance.   6. CAC score of 0 which is 0 percentile for age-, race-, and sex-matched controls.   7. Mild aortic atherosclerosis.  Risk Assessment/Calculations:    CHA2DS2-VASc Score = 5   This indicates a 7.2% annual risk of stroke. The patient's score is based upon: CHF History: 0 HTN History: 1 Diabetes History: 0 Stroke History: 0 Vascular Disease History: 1 Age Score: 2 Gender Score: 1    HYPERTENSION CONTROL Vitals:   01/26/24 0945 01/26/24 1017  BP: (!) 187/82 (!) 160/98    The patient's blood pressure is elevated above target today.  In order to address the patient's elevated BP: Blood pressure will be monitored at home to determine if medication changes need to be made.      Physical Exam:   VS:  BP (!) 160/98   Pulse (!) 58   Ht 5' 4 (1.626 m)   Wt 182 lb (82.6 kg)   SpO2 97%   BMI 31.24 kg/m    Wt Readings from Last 3 Encounters:  01/26/24 182 lb (82.6 kg)  12/21/23 181 lb 9.6 oz (82.4 kg)  11/25/23 187 lb (84.8 kg)     GEN: Pleasant female appearing in no acute distress NECK: No JVD; No carotid bruits CARDIAC: RRR, no murmurs, rubs, gallops RESPIRATORY:  Clear to auscultation without rales, wheezing or rhonchi  ABDOMEN: Appears non-distended. No obvious abdominal masses. EXTREMITIES: No clubbing or cyanosis. No pitting edema.  Distal pedal pulses are 2+ bilaterally.   Assessment and Plan:   1. Persistent atrial fibrillation/Atypical atrial flutter - She is maintaining normal sinus rhythm by examination today. Continue Lopressor  75 mg twice daily for rate-control.  Would not further titrate given her baseline heart rate in the 50's.  2. Use of Long-term Anticoagulation - Continue Eliquis  5 mg twice daily for anticoagulation which is the appropriate dose given her current age, weight and renal function (creatinine was at 1.21 when checked in 11/2023). CBC in 11/2023 showed her hemoglobin was stable at 15.2  with platelets at 378 K.  3. HTN - BP was initially recorded at 187/82, rechecked and at 160/98. She has not yet taken her morning medications. She was provided with a BP log and encouraged to return this in 3 to 4 weeks. Continue Lopressor  75 mg twice daily for now. Would not be able to further titrate given her heart rate in the 40's on a higher dose. If BP remains above goal, would likely need to add Amlodipine or Losartan.  4. Splenic infarct - Previously recommended to continue lifelong anticoagulation. Remains on Eliquis  5 mg twice daily.  Signed, Laymon CHRISTELLA Qua, PA-C

## 2024-01-26 ENCOUNTER — Encounter: Payer: Self-pay | Admitting: Student

## 2024-01-26 ENCOUNTER — Ambulatory Visit: Attending: Student | Admitting: Student

## 2024-01-26 VITALS — BP 160/98 | HR 58 | Ht 64.0 in | Wt 182.0 lb

## 2024-01-26 DIAGNOSIS — Z7901 Long term (current) use of anticoagulants: Secondary | ICD-10-CM | POA: Diagnosis not present

## 2024-01-26 DIAGNOSIS — I4819 Other persistent atrial fibrillation: Secondary | ICD-10-CM | POA: Insufficient documentation

## 2024-01-26 DIAGNOSIS — I10 Essential (primary) hypertension: Secondary | ICD-10-CM | POA: Diagnosis not present

## 2024-01-26 DIAGNOSIS — I484 Atypical atrial flutter: Secondary | ICD-10-CM | POA: Insufficient documentation

## 2024-01-26 DIAGNOSIS — D735 Infarction of spleen: Secondary | ICD-10-CM | POA: Insufficient documentation

## 2024-01-26 NOTE — Patient Instructions (Signed)
 Medication Instructions:   Your physician recommends that you continue on your current medications as directed. Please refer to the Current Medication list given to you today.  Monitor Blood Pressure for 3-4 Weeks and record readings.   *If you need a refill on your cardiac medications before your next appointment, please call your pharmacy*  Lab Work: NONE   If you have labs (blood work) drawn today and your tests are completely normal, you will receive your results only by: MyChart Message (if you have MyChart) OR A paper copy in the mail If you have any lab test that is abnormal or we need to change your treatment, we will call you to review the results.  Testing/Procedures: NONE   Follow-Up: At [facility], you and your health needs are our priority.  As part of our continuing mission to provide you with exceptional heart care, our providers are all part of one team.  This team includes your primary Cardiologist (physician) and Advanced Practice Providers or APPs (Physician Assistants and Nurse Practitioners) who all work together to provide you with the care you need, when you need it.  Your next appointment:    November - December   Provider:   Laymon Qua, PA-C    We recommend signing up for the patient portal called MyChart.  Sign up information is provided on this After Visit Summary.  MyChart is used to connect with patients for Virtual Visits (Telemedicine).  Patients are able to view lab/test results, encounter notes, upcoming appointments, etc.  Non-urgent messages can be sent to your provider as well.   To learn more about what you can do with MyChart, go to [email or web address].   Other Instructions Thank you for choosing [facility]!

## 2024-02-23 ENCOUNTER — Encounter (HOSPITAL_COMMUNITY): Payer: Self-pay | Admitting: Physician Assistant

## 2024-02-23 ENCOUNTER — Ambulatory Visit (HOSPITAL_COMMUNITY)
Admission: RE | Admit: 2024-02-23 | Discharge: 2024-02-23 | Disposition: A | Discharge status: Home / Self Care | Source: Ambulatory Visit | Attending: Physician Assistant | Admitting: Physician Assistant

## 2024-02-23 VITALS — BP 158/100 | HR 96 | Ht 64.0 in | Wt 180.2 lb

## 2024-02-23 DIAGNOSIS — I10 Essential (primary) hypertension: Secondary | ICD-10-CM

## 2024-02-23 DIAGNOSIS — K559 Vascular disorder of intestine, unspecified: Secondary | ICD-10-CM | POA: Diagnosis not present

## 2024-02-23 DIAGNOSIS — D6869 Other thrombophilia: Secondary | ICD-10-CM | POA: Diagnosis not present

## 2024-02-23 DIAGNOSIS — K861 Other chronic pancreatitis: Secondary | ICD-10-CM | POA: Insufficient documentation

## 2024-02-23 DIAGNOSIS — I700 Atherosclerosis of aorta: Secondary | ICD-10-CM | POA: Insufficient documentation

## 2024-02-23 DIAGNOSIS — I4891 Unspecified atrial fibrillation: Secondary | ICD-10-CM | POA: Diagnosis present

## 2024-02-23 DIAGNOSIS — I4819 Other persistent atrial fibrillation: Secondary | ICD-10-CM | POA: Insufficient documentation

## 2024-02-23 MED ORDER — FLECAINIDE ACETATE 50 MG PO TABS: 50.0000 mg | ORAL_TABLET | Freq: Two times a day (BID) | ORAL | 3 refills | Status: DC

## 2024-02-23 NOTE — Patient Instructions (Addendum)
 Start Flecainide 50mg  twice a day

## 2024-02-23 NOTE — H&P (View-Only) (Signed)
 Primary Care Physician: Orpha Yancey LABOR, MD Primary Cardiologist: Dr Hobart Primary Electrophysiologist: Dr Cindie Referring Physician: Dr Hobart   Shelly Maldonado is a 78 y.o. female with a history of HTN, aortic atherosclerosis, chronic pancreatitis, ischemic enteritis, atrial fibrillation who presents for follow up in the [facility].  The patient was initially diagnosed with atrial fibrillation during hospitalization in [place] from 10/11/21-10/16/21. She was started in Eliquis  and metoprolol  at that time. 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 Embolic event not felt to be an Eliquis  failure as she has just started the medication. Plan was for TEE/DCCV after 3 weeks of anticoagulation. She was admitted for observation 11/08/21 with SOB and orthopnea, found to have bilateral pleural effusions, was diuresed. Seen by Olivia Pavy on 11/18/21 and her BB was increased. She has monitored her heart rates since then which have been elevated 100-120 bpm. Her BB was further increased.   Patient is s/p DCCV on 01/02/22. TEE was not performed. Unfortunately, she went back out of rhythm on on 01/09/22 with symptoms of intermittent lightheadedness and fatigue. She was seen by Dr Cindie and underwent afib ablation 06/06/22. She did have persistent afib post ablation and had another DCCV on 07/01/22 and 11/25/23.   Patient returns for follow up for atrial fibrillation. She states that she went back into afib on 02/04/24. There were no specific triggers that she could identify. She has symptoms of fatigue and intermittent lightheadedness which have been typical of her afib in the past. No bleeding issues on anticoagulation.   Today, she  denies symptoms of palpitations, chest pain, shortness of breath, orthopnea, PND, lower extremity edema, presyncope, syncope, snoring, daytime somnolence, bleeding,  or neurologic sequela. The patient is tolerating medications without difficulties and is otherwise without complaint today.    Atrial Fibrillation Risk Factors:  she does not have symptoms or diagnosis of sleep apnea. Negative sleep study 01/29/23 she does not have a history of rheumatic fever. she does not have a history of alcohol use. The patient does not have a history of early familial atrial fibrillation or other arrhythmias.   Atrial Fibrillation Management history:  Previous antiarrhythmic drugs: none Previous cardioversions: 01/02/22, 07/01/22, 11/25/23 Previous ablations: 06/06/22 Anticoagulation history: Eliquis    Past Medical History:  Diagnosis Date   Atrial fibrillation (HCC)    Hypertension    Pancreatitis    Shingles     ROS- All systems are reviewed and negative except as per the HPI above.  Physical Exam: Vitals:   02/23/24 0947  BP: (!) 158/100  Pulse: 96  Weight: 81.7 kg  Height: 5' 4 (1.626 m)    GEN: Well nourished, well developed in no acute distress CARDIAC: Irregularly irregular rate and rhythm, no murmurs, rubs, gallops RESPIRATORY:  Clear to auscultation without rales, wheezing or rhonchi  ABDOMEN: Soft, non-tender, non-distended EXTREMITIES:  No edema; No deformity    Wt Readings from Last 3 Encounters:  02/23/24 81.7 kg  01/26/24 82.6 kg  12/21/23 82.4 kg    EKG today demonstrates  Afib Vent. rate 96 BPM PR interval * ms QRS duration 78 ms QT/QTcB 350/442 ms   Echo 10/22/21 demonstrated   1. Left ventricular ejection fraction, by estimation, is 60 to 65%. The  left ventricle has normal function. The left ventricle has no regional  wall motion abnormalities. There is mild left ventricular hypertrophy of  the basal-septal segment. Left ventricular  diastolic function could not be evaluated.   2. Right ventricular systolic function is normal. The right ventricular  size is normal. There is moderately elevated pulmonary artery  systolic  pressure. The estimated right ventricular systolic pressure is 50.1 mmHg.   3. The pericardial effusion is anterior to the right ventricle.   4. The mitral valve is normal in structure. Trivial mitral valve  regurgitation. No evidence of mitral stenosis.   5. The aortic valve is normal in structure. Aortic valve regurgitation is  not visualized. No aortic stenosis is present.   6. The inferior vena cava is normal in size with greater than 50%  respiratory variability, suggesting right atrial pressure of 3 mmHg.   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   S/p afib ablation 06/06/22 Patient back in persistent afib. We discussed rhythm control options today including flecainide , dofetilide, and amiodarone. On review of her ablation report, repeat ablation was not recommended due to heavy scar burden in LA. After discussing the risks and benefits, will start flecainide  50 mg BID. If she does not convert chemically, will increase dose and plan for DCCV.  Continue Lopressor  75 mg BID Continue Eliquis  5 mg BID Kardia mobile for home monitoring.   Secondary Hypercoagulable State (ICD10:  D68.69) The patient is at significant risk for stroke/thromboembolism based upon her CHA2DS2-VASc Score of 5.  Continue Apixaban  (Eliquis ). No bleeding issues.   HTN Elevated today, will reassess in SR.    Follow up in the AF clinic for ECG next week.     Daril Kicks PA-C Afib Clinic [facility] [address] [employer] [place], [place] [address] [phone] 02/23/2024 10:04 AM

## 2024-02-23 NOTE — Progress Notes (Signed)
 Primary Care Physician: Orpha Yancey LABOR, MD Primary Cardiologist: Dr Hobart Primary Electrophysiologist: Dr Cindie Referring Physician: Dr Hobart   Shelly Maldonado is a 78 y.o. female with a history of HTN, aortic atherosclerosis, chronic pancreatitis, ischemic enteritis, atrial fibrillation who presents for follow up in the [facility].  The patient was initially diagnosed with atrial fibrillation during hospitalization in [place] from 10/11/21-10/16/21. She was started in Eliquis  and metoprolol  at that time. She was admitted to [facility] 10/21/21 with ischemic enteritis with splenic infarct concerning for embolus as well as pneumonia. Embolic event not felt to be an Eliquis  failure as she has just started the medication. Plan was for TEE/DCCV after 3  of anticoagulation. She was admitted for observation 11/08/21 with SOB and orthopnea, found to have bilateral pleural effusions, was diuresed. Seen by Olivia Pavy on 11/18/21 and her BB was increased. She has monitored her heart rates since then which have been elevated 100-120 bpm. Her BB was further increased.   Patient is s/p DCCV on 01/02/22. TEE was not performed. Unfortunately, she went back out of rhythm on on 01/09/22 with symptoms of intermittent lightheadedness and fatigue. She was seen by Dr Cindie and underwent afib ablation 06/06/22. She did have persistent afib post ablation and had another DCCV on 07/01/22 and 11/25/23.   Patient returns for follow up for atrial fibrillation. She states that she went back into afib on 02/04/24. There were no specific triggers that she could identify. She has symptoms of fatigue and intermittent lightheadedness which have been typical of her afib in the past. No bleeding issues on anticoagulation.   Today, she  denies symptoms of palpitations, chest pain, shortness of breath, orthopnea, PND, lower extremity edema, presyncope, syncope, snoring, daytime somnolence, bleeding,  or neurologic sequela. The patient is tolerating medications without difficulties and is otherwise without complaint today.    Atrial Fibrillation Risk Factors:  she does not have symptoms or diagnosis of sleep apnea. Negative sleep study 01/29/23 she does not have a history of rheumatic fever. she does not have a history of alcohol use. The patient does not have a history of early familial atrial fibrillation or other arrhythmias.   Atrial Fibrillation Management history:  Previous antiarrhythmic drugs: none Previous cardioversions: 01/02/22, 07/01/22, 11/25/23 Previous ablations: 06/06/22 Anticoagulation history: Eliquis    Past Medical History:  Diagnosis Date   Atrial fibrillation (HCC)    Hypertension    Pancreatitis    Shingles     ROS- All systems are reviewed and negative except as per the HPI above.  Physical Exam: Vitals:   02/23/24 0947  BP: (!) 158/100  Pulse: 96  Weight: 81.7 kg  Height: 5' 4 (1.626 m)    GEN: Well nourished, well developed in no acute distress CARDIAC: Irregularly irregular rate and rhythm, no murmurs, rubs, gallops RESPIRATORY:  Clear to auscultation without rales, wheezing or rhonchi  ABDOMEN: Soft, non-tender, non-distended EXTREMITIES:  No edema; No deformity    Wt Readings from Last 3 Encounters:  02/23/24 81.7 kg  01/26/24 82.6 kg  12/21/23 82.4 kg    EKG today demonstrates  Afib Vent. rate 96 BPM PR interval * ms QRS duration 78 ms QT/QTcB 350/442 ms   Echo 10/22/21 demonstrated   1. Left ventricular ejection fraction, by estimation, is 60 to 65%. The  left ventricle has normal function. The left ventricle has no regional  wall motion abnormalities. There is mild left ventricular hypertrophy of  the basal-septal segment. Left ventricular  diastolic function could not be evaluated.   2. Right ventricular systolic function is normal. The right ventricular  size is normal. There is moderately elevated pulmonary artery  systolic  pressure. The estimated right ventricular systolic pressure is 50.1 mmHg.   3. The pericardial effusion is anterior to the right ventricle.   4. The mitral valve is normal in structure. Trivial mitral valve  regurgitation. No evidence of mitral stenosis.   5. The aortic valve is normal in structure. Aortic valve regurgitation is  not visualized. No aortic stenosis is present.   6. The inferior vena cava is normal in size with greater than 50%  respiratory variability, suggesting right atrial pressure of 3 mmHg.   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   S/p afib ablation 06/06/22 Patient back in persistent afib. We discussed rhythm control options today including flecainide , dofetilide, and amiodarone. On review of her ablation report, repeat ablation was not recommended due to heavy scar burden in LA. After discussing the risks and benefits, will start flecainide  50 mg BID. If she does not convert chemically, will increase dose and plan for DCCV.  Continue Lopressor  75 mg BID Continue Eliquis  5 mg BID Kardia mobile for home monitoring.   Secondary Hypercoagulable State (ICD10:  D68.69) The patient is at significant risk for stroke/thromboembolism based upon her CHA2DS2-VASc Score of 5.  Continue Apixaban  (Eliquis ). No bleeding issues.   HTN Elevated today, will reassess in SR.    Follow up in the AF clinic for ECG next week.     Daril Kicks PA-C [facility] [address] [employer] [place], [place] [address] [phone] 02/23/2024 10:04 AM

## 2024-03-01 ENCOUNTER — Ambulatory Visit (HOSPITAL_COMMUNITY)
Admission: RE | Admit: 2024-03-01 | Discharge: 2024-03-01 | Disposition: A | Discharge status: Home / Self Care | Source: Ambulatory Visit | Attending: Physician Assistant | Admitting: Physician Assistant

## 2024-03-01 DIAGNOSIS — I4819 Other persistent atrial fibrillation: Secondary | ICD-10-CM | POA: Insufficient documentation

## 2024-03-01 DIAGNOSIS — I4891 Unspecified atrial fibrillation: Secondary | ICD-10-CM | POA: Diagnosis not present

## 2024-03-01 MED ORDER — FLECAINIDE ACETATE 100 MG PO TABS: 100.0000 mg | ORAL_TABLET | Freq: Two times a day (BID) | ORAL | 3 refills | Status: DC

## 2024-03-01 NOTE — Progress Notes (Signed)
 Patient returns for ECG after flecainide  start. ECG shows:  Atrial flutter with variable block Vent. rate 106 BPM PR interval * ms QRS duration 92 ms QT/QTcB 378/502 ms  Will increase flecainide  to 100 mg BID and plan for repeat DCCV. F/u in the AF clinic post DCCV.

## 2024-03-01 NOTE — Patient Instructions (Addendum)
 Increase flecainide  to 100mg  twice a day      Cardioversion scheduled for:  Wednesday, August 20th    - Arrive at the [facility] ([address])  and check in with ADMITTING at 10:30 AM   - Do not eat or drink anything after midnight the night prior to your procedure.   - Take all your morning medication (except diabetic medications) with a sip of water  prior to arrival.  - Do NOT miss any doses of your blood thinner - if you should miss a dose or take a dose more than 4 hours late -- please notify our office immediately.  - You will not be able to drive home after your procedure. Please ensure you have a responsible adult to drive you home. You will need someone with you for 24 hours post procedure.     - Expect to be in the procedural area approximately 2 hours.   - If you feel as if you go back into normal rhythm prior to scheduled cardioversion, please notify our office immediately.   If your procedure is canceled in the cardioversion suite you will be charged a cancellation fee.

## 2024-03-02 ENCOUNTER — Encounter (HOSPITAL_COMMUNITY)
Admission: RE | Disposition: A | Discharge status: Home / Self Care | Payer: Self-pay | Source: Home / Self Care | Attending: Cardiovascular Disease

## 2024-03-02 ENCOUNTER — Other Ambulatory Visit: Payer: Self-pay

## 2024-03-02 ENCOUNTER — Ambulatory Visit (HOSPITAL_COMMUNITY)

## 2024-03-02 ENCOUNTER — Ambulatory Visit (HOSPITAL_COMMUNITY): Payer: Self-pay | Admitting: Physician Assistant

## 2024-03-02 ENCOUNTER — Ambulatory Visit (HOSPITAL_COMMUNITY)
Admission: RE | Admit: 2024-03-02 | Discharge: 2024-03-02 | Disposition: A | Discharge status: Home / Self Care | Attending: Cardiovascular Disease | Admitting: Cardiovascular Disease

## 2024-03-02 ENCOUNTER — Encounter (HOSPITAL_COMMUNITY): Payer: Self-pay | Admitting: Cardiovascular Disease

## 2024-03-02 DIAGNOSIS — I10 Essential (primary) hypertension: Secondary | ICD-10-CM | POA: Insufficient documentation

## 2024-03-02 DIAGNOSIS — Z87891 Personal history of nicotine dependence: Secondary | ICD-10-CM

## 2024-03-02 DIAGNOSIS — I4892 Unspecified atrial flutter: Secondary | ICD-10-CM | POA: Diagnosis not present

## 2024-03-02 DIAGNOSIS — D6869 Other thrombophilia: Secondary | ICD-10-CM | POA: Diagnosis not present

## 2024-03-02 DIAGNOSIS — Z79899 Other long term (current) drug therapy: Secondary | ICD-10-CM | POA: Diagnosis not present

## 2024-03-02 DIAGNOSIS — I129 Hypertensive chronic kidney disease with stage 1 through stage 4 chronic kidney disease, or unspecified chronic kidney disease: Secondary | ICD-10-CM | POA: Diagnosis not present

## 2024-03-02 DIAGNOSIS — I4819 Other persistent atrial fibrillation: Secondary | ICD-10-CM | POA: Insufficient documentation

## 2024-03-02 DIAGNOSIS — I700 Atherosclerosis of aorta: Secondary | ICD-10-CM | POA: Diagnosis not present

## 2024-03-02 DIAGNOSIS — I4891 Unspecified atrial fibrillation: Secondary | ICD-10-CM

## 2024-03-02 DIAGNOSIS — Z7901 Long term (current) use of anticoagulants: Secondary | ICD-10-CM | POA: Insufficient documentation

## 2024-03-02 DIAGNOSIS — N1831 Chronic kidney disease, stage 3a: Secondary | ICD-10-CM | POA: Diagnosis not present

## 2024-03-02 HISTORY — PX: CARDIOVERSION: EP1203

## 2024-03-02 LAB — BASIC METABOLIC PANEL WITH GFR
BUN/Creatinine Ratio: 15 (ref 12–28)
BUN: 18 mg/dL (ref 8–27)
CO2: 22 mmol/L (ref 20–29)
Calcium: 9.8 mg/dL (ref 8.7–10.3)
Chloride: 102 mmol/L (ref 96–106)
Creatinine, Ser: 1.24 mg/dL — ABNORMAL HIGH (ref 0.57–1.00)
Glucose: 97 mg/dL (ref 70–99)
Potassium: 5.1 mmol/L (ref 3.5–5.2)
Sodium: 140 mmol/L (ref 134–144)
eGFR: 45 mL/min/1.73 — ABNORMAL LOW (ref >59)

## 2024-03-02 LAB — CBC
Hematocrit: 45.8 % (ref 34.0–46.6)
Hemoglobin: 14.6 g/dL (ref 11.1–15.9)
MCH: 31.4 pg (ref 26.6–33.0)
MCHC: 31.9 g/dL (ref 31.5–35.7)
MCV: 99 fL — ABNORMAL HIGH (ref 79–97)
Platelets: 334 x10E3/uL (ref 150–450)
RBC: 4.65 x10E6/uL (ref 3.77–5.28)
RDW: 13.3 % (ref 11.7–15.4)
WBC: 9 x10E3/uL (ref 3.4–10.8)

## 2024-03-02 SURGERY — CARDIOVERSION (CATH LAB)
Anesthesia: General

## 2024-03-02 MED ORDER — PROPOFOL 10 MG/ML IV BOLUS
INTRAVENOUS | Status: DC | PRN
  Administered 2024-03-02: 60 mg via INTRAVENOUS

## 2024-03-02 MED ORDER — LIDOCAINE 2% (20 MG/ML) 5 ML SYRINGE
INTRAMUSCULAR | Status: DC | PRN
Start: 2024-03-02 — End: 2024-03-02
  Administered 2024-03-02: 60 mg via INTRAVENOUS

## 2024-03-02 MED ORDER — SODIUM CHLORIDE 0.9 % IV SOLN: INTRAVENOUS | Status: DC

## 2024-03-02 SURGICAL SUPPLY — 1 items: PAD DEFIB RADIO PHYSIO CONN (PAD) ×1 IMPLANT

## 2024-03-02 NOTE — Anesthesia Procedure Notes (Signed)
 Procedure Name: MAC Date/Time: 03/02/2024 11:03 AM  Performed by: Viviana Almarie DASEN, CRNAPre-anesthesia Checklist: Patient identified, Suction available, Patient being monitored, Emergency Drugs available and Timeout performed Patient Re-evaluated:Patient Re-evaluated prior to induction Oxygen Delivery Method: Nasal cannula Induction Type: IV induction Placement Confirmation: positive ETCO2

## 2024-03-02 NOTE — Interval H&P Note (Signed)
 History and Physical Interval Note:  03/02/2024 10:52 AM  Shelly Maldonado  has presented today for surgery, with the diagnosis of afib.  The various methods of treatment have been discussed with the patient and family. After consideration of risks, benefits and other options for treatment, the patient has consented to  Procedure(s): CARDIOVERSION (N/A) as a surgical intervention.  The patient's history has been reviewed, patient examined, no change in status, stable for surgery.  I have reviewed the patient's chart and labs.  Questions were answered to the patient's satisfaction.     Princess Karnes

## 2024-03-02 NOTE — Discharge Instructions (Signed)

## 2024-03-02 NOTE — Op Note (Signed)
 Procedure: Electrical Cardioversion Indications:  Atrial Fibrillation  Procedure Details:  Consent: Risks of procedure as well as the alternatives and risks of each were explained to the (patient/caregiver).  Consent for procedure obtained.  Time Out: Verified patient identification, verified procedure, site/side was marked, verified correct patient position, special equipment/implants available, medications/allergies/relevent history reviewed, required imaging and test results available.  Performed  Patient placed on cardiac monitor, pulse oximetry, supplemental oxygen as necessary.  Sedation given: propofol  IV, Dr. Leopoldo Pacer pads placed anterior and posterior chest.  Cardioverted 1 time(s).  Cardioversion with synchronized biphasic 200J shock.  Evaluation: Findings: Post procedure EKG shows: NSR Complications: None Patient did tolerate procedure well.  Time Spent Directly with the Patient:  30 minutes   Shelly Maldonado 03/02/2024, 11:12 AM

## 2024-03-02 NOTE — Anesthesia Preprocedure Evaluation (Signed)
 Anesthesia Evaluation  Patient identified by MRN, date of birth, ID band Patient awake    Reviewed: Allergy & Precautions, NPO status , Patient's Chart, lab work & pertinent test results  History of Anesthesia Complications Negative for: history of anesthetic complications  Airway Mallampati: III  TM Distance: >3 FB Neck ROM: Full    Dental  (+) Dental Advisory Given   Pulmonary neg shortness of breath, neg sleep apnea, neg COPD, neg recent URI, former smoker   breath sounds clear to auscultation       Cardiovascular hypertension, Pt. on medications and Pt. on home beta blockers + dysrhythmias Atrial Fibrillation  Rhythm:Irregular  1. Left ventricular ejection fraction, by estimation, is 60 to 65%. The  left ventricle has normal function. The left ventricle has no regional  wall motion abnormalities. There is mild left ventricular hypertrophy of  the basal-septal segment. Left  ventricular diastolic function could not be evaluated.   2. Right ventricular systolic function is normal. The right ventricular  size is normal. There is moderately elevated pulmonary artery systolic  pressure. The estimated right ventricular systolic pressure is 50.1 mmHg.   3. The pericardial effusion is anterior to the right ventricle.   4. The mitral valve is normal in structure. Trivial mitral valve  regurgitation. No evidence of mitral stenosis.   5. The aortic valve is normal in structure. Aortic valve regurgitation is  not visualized. No aortic stenosis is present.   6. The inferior vena cava is normal in size with greater than 50%  respiratory variability, suggesting right atrial pressure of 3 mmHg.      Neuro/Psych negative neurological ROS  negative psych ROS   GI/Hepatic   Endo/Other    Renal/GU Renal InsufficiencyRenal disease     Musculoskeletal negative musculoskeletal ROS (+)    Abdominal   Peds  Hematology  (+) Blood  dyscrasia Lab Results      Component                Value               Date                      WBC                      9.0                 03/01/2024                HGB                      14.6                03/01/2024                HCT                      45.8                03/01/2024                MCV                      99 (H)              03/01/2024                PLT  334                 03/01/2024             eliquis    Anesthesia Other Findings   Reproductive/Obstetrics                              Anesthesia Physical Anesthesia Plan  ASA: 3  Anesthesia Plan: General   Post-op Pain Management: Minimal or no pain anticipated   Induction: Intravenous  PONV Risk Score and Plan: 3 and Treatment may vary due to age or medical condition  Airway Management Planned: Nasal Cannula, Natural Airway and Simple Face Mask  Additional Equipment: None  Intra-op Plan:   Post-operative Plan:   Informed Consent: I have reviewed the patients History and Physical, chart, labs and discussed the procedure including the risks, benefits and alternatives for the proposed anesthesia with the patient or authorized representative who has indicated his/her understanding and acceptance.     Dental advisory given  Plan Discussed with: CRNA  Anesthesia Plan Comments:          Anesthesia Quick Evaluation

## 2024-03-02 NOTE — Transfer of Care (Signed)
 Immediate Anesthesia Transfer of Care Note  Patient: Shelly Maldonado  Procedure(s) Performed: CARDIOVERSION  Patient Location: Cath holding  Anesthesia Type:MAC  Level of Consciousness: sedated  Airway & Oxygen Therapy: Patient Spontanous Breathing and Patient connected to nasal cannula oxygen  Post-op Assessment: Report given to RN, Post -op Vital signs reviewed and stable, Patient moving all extremities X 4, and Patient able to stick tongue midline  Post vital signs: Reviewed and stable  Last Vitals:  Vitals Value Taken Time  BP 90/46   Temp 36.6   Pulse 56   Resp 20   SpO2 96     Last Pain:  Vitals:   03/02/24 1025  TempSrc: Temporal         Complications: No notable events documented.

## 2024-03-03 NOTE — Anesthesia Postprocedure Evaluation (Signed)
 Anesthesia Post Note  Patient: Shelly Maldonado  Procedure(s) Performed: CARDIOVERSION     Patient location during evaluation: Cath Lab Anesthesia Type: General Level of consciousness: awake and alert Pain management: pain level controlled Vital Signs Assessment: post-procedure vital signs reviewed and stable Respiratory status: spontaneous breathing, nonlabored ventilation and respiratory function stable Cardiovascular status: blood pressure returned to baseline and stable Postop Assessment: no apparent nausea or vomiting Anesthetic complications: no   No notable events documented.                 Nyzaiah Kai

## 2024-03-07 ENCOUNTER — Other Ambulatory Visit: Payer: Self-pay

## 2024-03-07 ENCOUNTER — Encounter (HOSPITAL_COMMUNITY): Payer: Self-pay | Admitting: *Deleted

## 2024-03-07 ENCOUNTER — Emergency Department (HOSPITAL_COMMUNITY)
Admission: EM | Admit: 2024-03-07 | Discharge: 2024-03-07 | Disposition: A | Discharge status: Home / Self Care | Attending: Emergency Medicine | Admitting: Emergency Medicine

## 2024-03-07 ENCOUNTER — Emergency Department (HOSPITAL_COMMUNITY)

## 2024-03-07 DIAGNOSIS — Z7901 Long term (current) use of anticoagulants: Secondary | ICD-10-CM | POA: Diagnosis not present

## 2024-03-07 DIAGNOSIS — Z96612 Presence of left artificial shoulder joint: Secondary | ICD-10-CM | POA: Diagnosis not present

## 2024-03-07 DIAGNOSIS — I700 Atherosclerosis of aorta: Secondary | ICD-10-CM | POA: Diagnosis not present

## 2024-03-07 DIAGNOSIS — R7989 Other specified abnormal findings of blood chemistry: Secondary | ICD-10-CM | POA: Diagnosis not present

## 2024-03-07 DIAGNOSIS — R0602 Shortness of breath: Secondary | ICD-10-CM | POA: Diagnosis not present

## 2024-03-07 DIAGNOSIS — R9389 Abnormal findings on diagnostic imaging of other specified body structures: Secondary | ICD-10-CM | POA: Diagnosis not present

## 2024-03-07 DIAGNOSIS — Z91040 Latex allergy status: Secondary | ICD-10-CM | POA: Diagnosis not present

## 2024-03-07 LAB — BASIC METABOLIC PANEL WITH GFR
Anion gap: 14 (ref 5–15)
BUN: 17 mg/dL (ref 8–23)
CO2: 22 mmol/L (ref 22–32)
Calcium: 9.3 mg/dL (ref 8.9–10.3)
Chloride: 102 mmol/L (ref 98–111)
Creatinine, Ser: 1.12 mg/dL — ABNORMAL HIGH (ref 0.44–1.00)
GFR, Estimated: 51 mL/min — ABNORMAL LOW (ref >60)
Glucose, Bld: 94 mg/dL (ref 70–99)
Potassium: 4.7 mmol/L (ref 3.5–5.1)
Sodium: 138 mmol/L (ref 135–145)

## 2024-03-07 LAB — HEPATIC FUNCTION PANEL
ALT: 23 U/L (ref 0–44)
AST: 28 U/L (ref 15–41)
Albumin: 3.8 g/dL (ref 3.5–5.0)
Alkaline Phosphatase: 7 U/L — ABNORMAL LOW (ref 38–126)
Bilirubin, Direct: 0.2 mg/dL (ref 0.0–0.2)
Indirect Bilirubin: 1.1 mg/dL — ABNORMAL HIGH (ref 0.3–0.9)
Total Bilirubin: 1.3 mg/dL — ABNORMAL HIGH (ref 0.0–1.2)
Total Protein: 7.6 g/dL (ref 6.5–8.1)

## 2024-03-07 LAB — CBC
HCT: 46.9 % — ABNORMAL HIGH (ref 36.0–46.0)
Hemoglobin: 15.5 g/dL — ABNORMAL HIGH (ref 12.0–15.0)
MCH: 32.4 pg (ref 26.0–34.0)
MCHC: 33 g/dL (ref 30.0–36.0)
MCV: 98.1 fL (ref 80.0–100.0)
Platelets: 340 K/uL (ref 150–400)
RBC: 4.78 MIL/uL (ref 3.87–5.11)
RDW: 13.9 % (ref 11.5–15.5)
WBC: 8.2 K/uL (ref 4.0–10.5)
nRBC: 0 % (ref 0.0–0.2)

## 2024-03-07 LAB — D-DIMER, QUANTITATIVE: D-Dimer, Quant: 0.69 ug{FEU}/mL — ABNORMAL HIGH (ref 0.00–0.50)

## 2024-03-07 LAB — TROPONIN I (HIGH SENSITIVITY)
Troponin I (High Sensitivity): 10 ng/L (ref <18)
Troponin I (High Sensitivity): 9 ng/L (ref <18)

## 2024-03-07 LAB — BRAIN NATRIURETIC PEPTIDE: B Natriuretic Peptide: 514 pg/mL — ABNORMAL HIGH (ref 0.0–100.0)

## 2024-03-07 MED ORDER — FUROSEMIDE 10 MG/ML IJ SOLN
40.0000 mg | Freq: Once | INTRAMUSCULAR | Status: AC
  Administered 2024-03-07: 40 mg via INTRAVENOUS
  Filled 2024-03-07: qty 4

## 2024-03-07 MED ORDER — FUROSEMIDE 20 MG PO TABS: 20.0000 mg | ORAL_TABLET | Freq: Every day | ORAL | 0 refills | Status: DC

## 2024-03-07 NOTE — ED Provider Notes (Signed)
 Signout from Dr. Zammit.  78 year old female with shortness of breath.  Received some diuretics.  Plan is to follow-up on second troponin.  If negative she can be discharged to follow-up outpatient with her treatment team. Physical Exam  BP (!) 155/74   Pulse 68   Temp 97.6 F (36.4 C) (Oral)   Resp 20   Ht 5' 4.5 (1.638 m)   SpO2 97%   BMI 30.45 kg/m   Physical Exam  Procedures  Procedures  ED Course / MDM    Medical Decision Making Amount and/or Complexity of Data Reviewed Labs: ordered. Radiology: ordered.  Risk Prescription drug management.   3:33 PM.  Second troponin came back flat.  Will continue with plan for discharge and outpatient follow-up.       Towana Ozell BROCKS, MD 03/07/24 [phone]

## 2024-03-07 NOTE — ED Provider Notes (Signed)
 [place] EM[facility]GENC[facility] DEPARTMENT AT [facility] Provider Note   CSN: [id number] Arrival date & time: 03/07/24  1132     Patient presents with: Shortness of Breath   Shelly Maldonado is a 78 y.o. female.   Patient complains of some shortness of breath when she lays down.  Patient not having any cough no fever no chills.  She recently was cardioverted for atrial fibs.  The history is provided by the patient and medical records. No language interpreter was used.  Shortness of Breath Severity:  Mild Onset quality:  Sudden Timing:  Intermittent Progression:  Waxing and waning Chronicity:  New Context: not activity   Relieved by:  Nothing Worsened by:  Nothing Ineffective treatments:  None tried Associated symptoms: no abdominal pain, no chest pain, no cough, no headaches and no rash        Prior to Admission medications   Medication Sig Start Date End Date Taking? Authorizing Provider  acetaminophen  (TYLENOL ) 500 MG tablet Take 500 mg by mouth every 6 (six) hours as needed for moderate pain or headache.   Yes [provider]  ELIQUIS  5 MG TABS tablet Take 1 tablet (5 mg total) by mouth 2 (two) times daily. 04/06/23  Yes Fenton, Clint R, PA  flecainide  (TAMBOCOR ) 100 MG tablet Take 1 tablet (100 mg total) by mouth 2 (two) times daily. 03/01/24  Yes Fenton, Clint R, PA  metoprolol  tartrate (LOPRESSOR ) 50 MG tablet Take 1.5 tablets (75 mg total) by mouth 2 (two) times daily. 12/21/23 12/15/24 Yes Fenton, Clint R, PA  metroNIDAZOLE (METROGEL) 1 % gel Apply 1 Application topically daily as needed (rosacea).   Yes [provider]  cefUROXime (CEFTIN) 500 MG tablet Take 500 mg by mouth 2 (two) times daily. Patient not taking: Reported on 03/07/2024 01/14/24   [provider]  Multiple Vitamins-Minerals (HAIR SKIN & NAILS) TABS Take 1 tablet by mouth daily.    [provider]    Allergies: Codeine, Morphine  and codeine, and Latex    Review of Systems   Constitutional:  Negative for appetite change and fatigue.  HENT:  Negative for congestion, ear discharge and sinus pressure.   Eyes:  Negative for discharge.  Respiratory:  Positive for shortness of breath. Negative for cough.   Cardiovascular:  Negative for chest pain.  Gastrointestinal:  Negative for abdominal pain and diarrhea.  Genitourinary:  Negative for frequency and hematuria.  Musculoskeletal:  Negative for back pain.  Skin:  Negative for rash.  Neurological:  Negative for seizures and headaches.  Psychiatric/Behavioral:  Negative for hallucinations.     Updated Vital Signs BP (!) 155/74   Pulse 68   Temp 97.6 F (36.4 C) (Oral)   Resp 20   Ht 5' 4.5 (1.638 m)   SpO2 97%   BMI 30.45 kg/m   Physical Exam Vitals and nursing note reviewed.  Constitutional:      Appearance: She is well-developed.  HENT:     Head: Normocephalic.     Nose: Nose normal.     Mouth/Throat:     Mouth: Mucous membranes are moist.  Eyes:     General: No scleral icterus.    Conjunctiva/sclera: Conjunctivae normal.  Neck:     Thyroid : No thyromegaly.  Cardiovascular:     Rate and Rhythm: Normal rate and regular rhythm.     Heart sounds: No murmur heard.    No friction rub. No gallop.  Pulmonary:     Breath sounds: No stridor. No  wheezing or rales.  Chest:     Chest wall: No tenderness.  Abdominal:     General: There is no distension.     Tenderness: There is no abdominal tenderness. There is no rebound.  Musculoskeletal:        General: Normal range of motion.     Cervical back: Neck supple.  Lymphadenopathy:     Cervical: No cervical adenopathy.  Skin:    Findings: No erythema or rash.  Neurological:     Mental Status: She is alert and oriented to person, place, and time.     Motor: No abnormal muscle tone.     Coordination: Coordination normal.  Psychiatric:        Behavior: Behavior normal.     (all labs ordered are listed, but only abnormal results are  displayed) Labs Reviewed  BASIC METABOLIC PANEL WITH GFR - Abnormal; Notable for the following components:      Result Value   Creatinine, Ser 1.12 (*)    GFR, Estimated 51 (*)    All other components within normal limits  CBC - Abnormal; Notable for the following components:   Hemoglobin 15.5 (*)    HCT 46.9 (*)    All other components within normal limits  BRAIN NATRIURETIC PEPTIDE - Abnormal; Notable for the following components:   B Natriuretic Peptide 514.0 (*)    All other components within normal limits  HEPATIC FUNCTION PANEL - Abnormal; Notable for the following components:   Alkaline Phosphatase 7 (*)    Total Bilirubin 1.3 (*)    Indirect Bilirubin 1.1 (*)    All other components within normal limits  D-DIMER, QUANTITATIVE (NOT AT [facility]) - Abnormal; Notable for the following components:   D-Dimer, Quant 0.69 (*)    All other components within normal limits  TROPONIN I (HIGH SENSITIVITY)  TROPONIN I (HIGH SENSITIVITY)    EKG: EKG Interpretation Date/Time:  Monday March 07 2024 11:48:28 EDT Ventricular Rate:  59 PR Interval:  67 QRS Duration:  147 QT Interval:  507 QTC Calculation: 414 R Axis:   28  Text Interpretation: Sinus rhythm Supraventricular bigeminy Short PR interval Probable left ventricular hypertrophy Confirmed by Suzette Pac [phone]) on 03/07/2024 2:43:20 PM  Radiology: DG Chest Portable 1 View Result Date: 03/07/2024 CLINICAL DATA:  Shortness of breath EXAM: PORTABLE CHEST 1 VIEW COMPARISON:  Chest radiograph November 08, 2021 FINDINGS: The heart size and mediastinal contours are within normal limits. Aortic knob is calcified. Elevation of left hemidiaphragm. Bibasilar mild atelectasis. No new consolidation or pleural effusion. Status post left shoulder arthroplasty. IMPRESSION: No active disease. Electronically Signed   By: Megan  Zare M.D.   On: 03/07/2024 12:58     Procedures   Medications Ordered in the ED  furosemide  (LASIX ) injection 40 mg (40  mg Intravenous Given 03/07/24 1409)     Patient with mildly elevated BNP.  She will be started on Lasix  and follow-up with her PCP                               Medical Decision Making Amount and/or Complexity of Data Reviewed Labs: ordered. Radiology: ordered.  Risk Prescription drug management.   Elevated BNP with shortness of breath.  Patient will be started on Lasix  and follow-up with PCP     Final diagnoses:  None    ED Discharge Orders     None  Suzette Pac, MD 03/07/24 [phone]

## 2024-03-07 NOTE — ED Triage Notes (Signed)
 Pt with c/o SOB at night mostly, not able to lay flat.  Pt with Afib. C/o CP Wednesday, denies at present.

## 2024-03-07 NOTE — Discharge Instructions (Signed)
 Follow-up with your family doctor next week or the end of this week for recheck

## 2024-03-17 ENCOUNTER — Ambulatory Visit (HOSPITAL_COMMUNITY)
Admission: RE | Admit: 2024-03-17 | Discharge: 2024-03-17 | Disposition: A | Discharge status: Home / Self Care | Source: Ambulatory Visit | Attending: Physician Assistant | Admitting: Physician Assistant

## 2024-03-17 VITALS — BP 178/84 | HR 44 | Ht 64.5 in | Wt 177.6 lb

## 2024-03-17 DIAGNOSIS — Z79899 Other long term (current) drug therapy: Secondary | ICD-10-CM | POA: Insufficient documentation

## 2024-03-17 DIAGNOSIS — I10 Essential (primary) hypertension: Secondary | ICD-10-CM | POA: Insufficient documentation

## 2024-03-17 DIAGNOSIS — D6869 Other thrombophilia: Secondary | ICD-10-CM | POA: Insufficient documentation

## 2024-03-17 DIAGNOSIS — Z5181 Encounter for therapeutic drug level monitoring: Secondary | ICD-10-CM | POA: Diagnosis not present

## 2024-03-17 DIAGNOSIS — I4819 Other persistent atrial fibrillation: Secondary | ICD-10-CM | POA: Insufficient documentation

## 2024-03-17 DIAGNOSIS — I4891 Unspecified atrial fibrillation: Secondary | ICD-10-CM | POA: Diagnosis not present

## 2024-03-17 MED ORDER — LOSARTAN POTASSIUM 25 MG PO TABS: 25.0000 mg | ORAL_TABLET | Freq: Every day | ORAL | 3 refills | Status: DC

## 2024-03-17 MED ORDER — METOPROLOL TARTRATE 50 MG PO TABS: 25.0000 mg | ORAL_TABLET | Freq: Two times a day (BID) | ORAL | Status: DC

## 2024-03-17 NOTE — Progress Notes (Signed)
 Primary Care Physician: Orpha Yancey LABOR, MD Primary Cardiologist: Dr Hobart Primary Electrophysiologist: Dr Cindie Referring Physician: Dr Hobart   Shelly Maldonado is a 78 y.o. female with a history of HTN, aortic atherosclerosis, chronic pancreatitis, ischemic enteritis, atrial fibrillation who presents for follow up in the [facility].  The patient was initially diagnosed with atrial fibrillation during hospitalization in [place] from 10/11/21-10/16/21. She was started in Eliquis  and metoprolol  at that time. She was admitted to [facility] 10/21/21 with ischemic enteritis with splenic infarct concerning for embolus as well as pneumonia. Embolic event not felt to be an Eliquis  failure as she has just started the medication. Plan was for TEE/DCCV after 3 weeks of anticoagulation. She was admitted for observation 11/08/21 with SOB and orthopnea, found to have bilateral pleural effusions, was diuresed. Seen by Olivia Pavy on 11/18/21 and her BB was increased. She has monitored her heart rates since then which have been elevated 100-120 bpm. Her BB was further increased.   Patient is s/p DCCV on 01/02/22. TEE was not performed. Unfortunately, she went back out of rhythm on on 01/09/22 with symptoms of intermittent lightheadedness and fatigue. She was seen by Dr Cindie and underwent afib ablation 06/06/22. She did have persistent afib post ablation and had another DCCV on 07/01/22 and 11/25/23. She was started on flecainide  for recurrent afib and is s/p another DCCV on 03/02/24.  Patient returns for follow up for atrial fibrillation and flecainide  monitoring. She presented to the ED on 03/07/24 with SOB, BNP was elevated at 514. She was treated with Lasix  and her SOB resolved. She does report continued fatigue. Her heart rates at home have been in the 40s bpm. No bleeding issues on anticoagulation.   Today, she  denies symptoms of palpitations, chest pain, shortness of breath,  orthopnea, PND, lower extremity edema, dizziness, presyncope, syncope, snoring, daytime somnolence, bleeding, or neurologic sequela. The patient is tolerating medications without difficulties and is otherwise without complaint today.    Atrial Fibrillation Risk Factors:  she does not have symptoms or diagnosis of sleep apnea. Negative sleep study 01/29/23 she does not have a history of rheumatic fever. she does not have a history of alcohol use. The patient does not have a history of early familial atrial fibrillation or other arrhythmias.   Atrial Fibrillation Management history:  Previous antiarrhythmic drugs: flecainide   Previous cardioversions: 01/02/22, 07/01/22, 11/25/23, 03/02/24 Previous ablations: 06/06/22 Anticoagulation history: Eliquis    Past Medical History:  Diagnosis Date   Atrial fibrillation (HCC)    Hypertension    Pancreatitis    Shingles     ROS- All systems are reviewed and negative except as per the HPI above.  Physical Exam: Vitals:   03/17/24 1018  BP: (!) 178/84  Pulse: (!) 44  Weight: 80.6 kg  Height: 5' 4.5 (1.638 m)     GEN: Well nourished, well developed in no acute distress CARDIAC: Regular rate and rhythm with occasional ectopy, no murmurs, rubs, gallops RESPIRATORY:  Clear to auscultation without rales, wheezing or rhonchi  ABDOMEN: Soft, non-tender, non-distended EXTREMITIES:  No edema; No deformity    Wt Readings from Last 3 Encounters:  03/17/24 80.6 kg  02/23/24 81.7 kg  01/26/24 82.6 kg    EKG today demonstrates  SB, PACs Vent. rate 44 BPM PR interval * ms QRS duration 118 ms QT/QTcB 516/441 ms   Echo 10/22/21 demonstrated   1. Left ventricular ejection fraction, by estimation, is 60 to 65%. The  left  ventricle has normal function. The left ventricle has no regional  wall motion abnormalities. There is mild left ventricular hypertrophy of  the basal-septal segment. Left ventricular diastolic function could not be  evaluated.   2. Right ventricular systolic function is normal. The right ventricular  size is normal. There is moderately elevated pulmonary artery systolic  pressure. The estimated right ventricular systolic pressure is 50.1 mmHg.   3. The pericardial effusion is anterior to the right ventricle.   4. The mitral valve is normal in structure. Trivial mitral valve  regurgitation. No evidence of mitral stenosis.   5. The aortic valve is normal in structure. Aortic valve regurgitation is  not visualized. No aortic stenosis is present.   6. The inferior vena cava is normal in size with greater than 50%  respiratory variability, suggesting right atrial pressure of 3 mmHg.   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   S/p afib ablation 06/06/22 (heavy scar burden in LA noted) S/p DCCV 03/02/24 Patient appears to be maintaining SR Continue flecainide  100 mg BID Will decrease Lopressor  to 25 mg BID given bradycardia and fatigue.  Continue Eliquis  5 mg BID Kardia mobile for home monitoring.   Secondary Hypercoagulable State (ICD10:  D68.69) The patient is at significant risk for stroke/thromboembolism based upon her CHA2DS2-VASc Score of 5.  Continue Apixaban  (Eliquis ). No bleeding issues.   High Risk Medication Monitoring (ICD 10: J342684) Patient requires ongoing monitoring for anti-arrhythmic medication which has the potential to cause life threatening arrhythmias. Intervals on ECG acceptable for flecainide  monitoring.     HTN Elevated today and at home. Last note from [place] reviewed, will start Losartan  25 mg daily.  Check bmet at follow up.     Follow up in the AF clinic in one month.     Daril Kicks PA-C Afib Clinic [facility] [address] [place], [place] [address] [phone] 03/17/2024 10:43 AM

## 2024-03-17 NOTE — Patient Instructions (Addendum)
 Decrease metoprolol  to 25mg  twice a day (1/2 of your 50mg  twice a day )  Start Losartan  25mg  once a day

## 2024-03-22 ENCOUNTER — Ambulatory Visit (HOSPITAL_COMMUNITY): Admitting: Physician Assistant

## 2024-03-23 ENCOUNTER — Telehealth: Payer: Self-pay | Admitting: Student

## 2024-03-23 ENCOUNTER — Telehealth (HOSPITAL_COMMUNITY): Payer: Self-pay

## 2024-03-23 MED ORDER — AMLODIPINE BESYLATE 5 MG PO TABS: 5.0000 mg | ORAL_TABLET | Freq: Two times a day (BID) | ORAL | 3 refills | Status: AC

## 2024-03-23 NOTE — Telephone Encounter (Signed)
 After speaking with DOD, Dr.Mallipeddi, she recommends starting Amlodipine  5 mg twice a day and record bp's and update us . Rx sent to [facility]

## 2024-03-23 NOTE — Telephone Encounter (Signed)
 Patient states bp still really high 196/86, 183/87, 190/77, 192/87 wanted to know what needs to be done. Reached out to Pray who recommends her reaching out to [place] in [place].

## 2024-03-23 NOTE — Telephone Encounter (Signed)
   Pt c/o BP issue: STAT if pt c/o blurred vision, one-sided weakness or slurred speech.  STAT if BP is GREATER than 180/120 TODAY.  STAT if BP is LESS than 90/60 and SYMPTOMATIC TODAY  1. What is your BP concern? Pt called afib clinic about her bp being high, instructed to call here   2. Have you taken any BP medication today? Yes   3. What are your last 5 BP readings?  9/10 - 196/86  9/9 - 183/87 9/8 - 190/77 9/7 -  192/87  4. Are you having any other symptoms (ex. Dizziness, headache, blurred vision, passed out)? Headache

## 2024-03-23 NOTE — Telephone Encounter (Signed)
 SABRA

## 2024-04-06 ENCOUNTER — Other Ambulatory Visit (HOSPITAL_COMMUNITY): Payer: Self-pay | Admitting: *Deleted

## 2024-04-06 DIAGNOSIS — I482 Chronic atrial fibrillation, unspecified: Secondary | ICD-10-CM

## 2024-04-06 MED ORDER — ELIQUIS 5 MG PO TABS: 5.0000 mg | ORAL_TABLET | Freq: Two times a day (BID) | ORAL | 6 refills | Status: AC

## 2024-04-14 ENCOUNTER — Ambulatory Visit (HOSPITAL_COMMUNITY)
Admission: RE | Admit: 2024-04-14 | Discharge: 2024-04-14 | Disposition: A | Discharge status: Home / Self Care | Source: Ambulatory Visit | Attending: Physician Assistant | Admitting: Physician Assistant

## 2024-04-14 VITALS — BP 110/60 | HR 46 | Ht 64.5 in | Wt 175.2 lb

## 2024-04-14 DIAGNOSIS — D6869 Other thrombophilia: Secondary | ICD-10-CM | POA: Insufficient documentation

## 2024-04-14 DIAGNOSIS — I4819 Other persistent atrial fibrillation: Secondary | ICD-10-CM | POA: Insufficient documentation

## 2024-04-14 DIAGNOSIS — Z79899 Other long term (current) drug therapy: Secondary | ICD-10-CM | POA: Insufficient documentation

## 2024-04-14 DIAGNOSIS — I4891 Unspecified atrial fibrillation: Secondary | ICD-10-CM | POA: Diagnosis not present

## 2024-04-14 DIAGNOSIS — Z5181 Encounter for therapeutic drug level monitoring: Secondary | ICD-10-CM | POA: Insufficient documentation

## 2024-04-14 MED ORDER — METOPROLOL TARTRATE 25 MG PO TABS: 12.5000 mg | ORAL_TABLET | Freq: Two times a day (BID) | ORAL | Status: AC

## 2024-04-14 NOTE — Progress Notes (Signed)
 Primary Care Physician: Orpha Yancey LABOR, MD Primary Cardiologist: Dr Hobart Primary Electrophysiologist: Dr Cindie Referring Physician: Dr Hobart   Shelly Maldonado is a 78 y.o. female with a history of HTN, aortic atherosclerosis, chronic pancreatitis, ischemic enteritis, atrial fibrillation who presents for follow up in the [facility].  The patient was initially diagnosed with atrial fibrillation during hospitalization in [place] from 10/11/21-10/16/21. She was started in Eliquis  and metoprolol  at that time. She was admitted to [facility] 10/21/21 with ischemic enteritis with splenic infarct concerning for embolus as well as pneumonia. Embolic event not felt to be an Eliquis  failure as she has just started the medication. Plan was for TEE/DCCV after 3 weeks of anticoagulation. She was admitted for observation 11/08/21 with SOB and orthopnea, found to have bilateral pleural effusions, was diuresed. Seen by Olivia Pavy on 11/18/21 and her BB was increased. She has monitored her heart rates since then which have been elevated 100-120 bpm. Her BB was further increased.   Patient is s/p DCCV on 01/02/22. TEE was not performed. Unfortunately, she went back out of rhythm on on 01/09/22 with symptoms of intermittent lightheadedness and fatigue. She was seen by Dr Cindie and underwent afib ablation 06/06/22. She did have persistent afib post ablation and had another DCCV on 07/01/22 and 11/25/23. She was started on flecainide  for recurrent afib and is s/p another DCCV on 03/02/24.  She presented to the ED on 03/07/24 with SOB, BNP was elevated at 514. She was treated with Lasix  and her SOB resolved.   Patient returns for follow up for atrial fibrillation and flecainide  monitoring. She remains in SR today and feels well. She states that since being on flecainide  her flip flopping in her chest at night has stopped. Her Kardia mobile shows SB with HR in mid to low 50's. No bleeding  issues on anticoagulation. Her back pain limits her physical activity.   Today, she  denies symptoms of palpitations, chest pain, shortness of breath, orthopnea, PND, lower extremity edema, dizziness, presyncope, syncope, snoring, daytime somnolence, bleeding, or neurologic sequela. The patient is tolerating medications without difficulties and is otherwise without complaint today.    Atrial Fibrillation Risk Factors:  she does not have symptoms or diagnosis of sleep apnea. Negative sleep study 01/29/23 she does not have a history of rheumatic fever. she does not have a history of alcohol use. The patient does not have a history of early familial atrial fibrillation or other arrhythmias.   Atrial Fibrillation Management history:  Previous antiarrhythmic drugs: flecainide   Previous cardioversions: 01/02/22, 07/01/22, 11/25/23, 03/02/24 Previous ablations: 06/06/22 Anticoagulation history: Eliquis    Past Medical History:  Diagnosis Date   Atrial fibrillation (HCC)    Hypertension    Pancreatitis    Shingles     ROS- All systems are reviewed and negative except as per the HPI above.  Physical Exam: Vitals:   04/14/24 1114  BP: 110/60  Pulse: (!) 46  Weight: 79.5 kg  Height: 5' 4.5 (1.638 m)    GEN: Well nourished, well developed in no acute distress CARDIAC: Regular rate and rhythm, no murmurs, rubs, gallops RESPIRATORY:  Clear to auscultation without rales, wheezing or rhonchi  ABDOMEN: Soft, non-tender, non-distended EXTREMITIES:  No edema; No deformity    Wt Readings from Last 3 Encounters:  04/14/24 79.5 kg  03/17/24 80.6 kg  02/23/24 81.7 kg    EKG today demonstrates  SB, 1st degree AV block Vent. rate 46 BPM PR interval 224 ms  QRS duration 98 ms QT/QTcB 480/420 ms   Echo 10/22/21 demonstrated   1. Left ventricular ejection fraction, by estimation, is 60 to 65%. The  left ventricle has normal function. The left ventricle has no regional  wall motion  abnormalities. There is mild left ventricular hypertrophy of  the basal-septal segment. Left ventricular diastolic function could not be evaluated.   2. Right ventricular systolic function is normal. The right ventricular  size is normal. There is moderately elevated pulmonary artery systolic  pressure. The estimated right ventricular systolic pressure is 50.1 mmHg.   3. The pericardial effusion is anterior to the right ventricle.   4. The mitral valve is normal in structure. Trivial mitral valve  regurgitation. No evidence of mitral stenosis.   5. The aortic valve is normal in structure. Aortic valve regurgitation is  not visualized. No aortic stenosis is present.   6. The inferior vena cava is normal in size with greater than 50%  respiratory variability, suggesting right atrial pressure of 3 mmHg.   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   S/p afib ablation 06/06/22 (heavy scar burden in LA noted) Patient appears to be maintaining SR Continue flecainide  100 mg BID Decrease Lopressor  to 12.5 mg BID with persistent bradycardia.  Continue Eliquis  5 mg BID Kardia mobile for home monitoring.   Secondary Hypercoagulable State (ICD10:  D68.69) The patient is at significant risk for stroke/thromboembolism based upon her CHA2DS2-VASc Score of 5.  Continue Apixaban  (Eliquis ). No bleeding issues.   High Risk Medication Monitoring (ICD 10: J342684) Patient requires ongoing monitoring for anti-arrhythmic medication which has the potential to cause life threatening arrhythmias. Intervals on ECG acceptable for flecainide  monitoring.  PR prolonged but stable.   HTN Stable on current regimen Much better with addition of amlodipine .    Follow up in the AF clinic in 3 months.     Daril Kicks PA-C Afib Clinic [facility] [address] [place], [place] [address] [phone] 04/14/2024 11:52 AM

## 2024-04-14 NOTE — Patient Instructions (Signed)
 Decrease metoprolol  to 12.5mg  twice a day (1/2 of a 25mg  tablet twice a day)

## 2024-04-18 ENCOUNTER — Emergency Department (HOSPITAL_COMMUNITY)

## 2024-04-18 ENCOUNTER — Other Ambulatory Visit: Payer: Self-pay

## 2024-04-18 ENCOUNTER — Emergency Department (HOSPITAL_COMMUNITY)
Admission: EM | Admit: 2024-04-18 | Discharge: 2024-04-18 | Disposition: A | Discharge status: Home / Self Care | Attending: Emergency Medicine | Admitting: Emergency Medicine

## 2024-04-18 ENCOUNTER — Encounter (HOSPITAL_COMMUNITY): Payer: Self-pay

## 2024-04-18 DIAGNOSIS — I6523 Occlusion and stenosis of bilateral carotid arteries: Secondary | ICD-10-CM | POA: Diagnosis not present

## 2024-04-18 DIAGNOSIS — Z79899 Other long term (current) drug therapy: Secondary | ICD-10-CM | POA: Insufficient documentation

## 2024-04-18 DIAGNOSIS — Z7901 Long term (current) use of anticoagulants: Secondary | ICD-10-CM | POA: Diagnosis not present

## 2024-04-18 DIAGNOSIS — M546 Pain in thoracic spine: Secondary | ICD-10-CM | POA: Diagnosis not present

## 2024-04-18 DIAGNOSIS — I4891 Unspecified atrial fibrillation: Secondary | ICD-10-CM | POA: Insufficient documentation

## 2024-04-18 DIAGNOSIS — Z9181 History of falling: Secondary | ICD-10-CM | POA: Diagnosis not present

## 2024-04-18 DIAGNOSIS — M25461 Effusion, right knee: Secondary | ICD-10-CM | POA: Insufficient documentation

## 2024-04-18 DIAGNOSIS — M25561 Pain in right knee: Secondary | ICD-10-CM | POA: Insufficient documentation

## 2024-04-18 DIAGNOSIS — R079 Chest pain, unspecified: Secondary | ICD-10-CM | POA: Diagnosis present

## 2024-04-18 DIAGNOSIS — I700 Atherosclerosis of aorta: Secondary | ICD-10-CM | POA: Insufficient documentation

## 2024-04-18 DIAGNOSIS — M542 Cervicalgia: Secondary | ICD-10-CM | POA: Diagnosis not present

## 2024-04-18 DIAGNOSIS — M47812 Spondylosis without myelopathy or radiculopathy, cervical region: Secondary | ICD-10-CM | POA: Diagnosis not present

## 2024-04-18 DIAGNOSIS — S199XXA Unspecified injury of neck, initial encounter: Secondary | ICD-10-CM | POA: Diagnosis not present

## 2024-04-18 DIAGNOSIS — Z91040 Latex allergy status: Secondary | ICD-10-CM | POA: Insufficient documentation

## 2024-04-18 DIAGNOSIS — I6782 Cerebral ischemia: Secondary | ICD-10-CM | POA: Diagnosis not present

## 2024-04-18 DIAGNOSIS — S8991XA Unspecified injury of right lower leg, initial encounter: Secondary | ICD-10-CM | POA: Diagnosis not present

## 2024-04-18 DIAGNOSIS — S0990XA Unspecified injury of head, initial encounter: Secondary | ICD-10-CM | POA: Diagnosis present

## 2024-04-18 DIAGNOSIS — N189 Chronic kidney disease, unspecified: Secondary | ICD-10-CM | POA: Diagnosis not present

## 2024-04-18 DIAGNOSIS — I10 Essential (primary) hypertension: Secondary | ICD-10-CM | POA: Diagnosis not present

## 2024-04-18 DIAGNOSIS — M5126 Other intervertebral disc displacement, lumbar region: Secondary | ICD-10-CM | POA: Diagnosis not present

## 2024-04-18 DIAGNOSIS — I672 Cerebral atherosclerosis: Secondary | ICD-10-CM | POA: Diagnosis not present

## 2024-04-18 DIAGNOSIS — S3992XA Unspecified injury of lower back, initial encounter: Secondary | ICD-10-CM | POA: Diagnosis not present

## 2024-04-18 DIAGNOSIS — I129 Hypertensive chronic kidney disease with stage 1 through stage 4 chronic kidney disease, or unspecified chronic kidney disease: Secondary | ICD-10-CM | POA: Diagnosis not present

## 2024-04-18 LAB — CBC WITH DIFFERENTIAL/PLATELET
Abs Immature Granulocytes: 0.04 K/uL (ref 0.00–0.07)
Basophils Absolute: 0.1 K/uL (ref 0.0–0.1)
Basophils Relative: 1 %
Eosinophils Absolute: 0.1 K/uL (ref 0.0–0.5)
Eosinophils Relative: 1 %
HCT: 42.9 % (ref 36.0–46.0)
Hemoglobin: 14.1 g/dL (ref 12.0–15.0)
Immature Granulocytes: 0 %
Lymphocytes Relative: 14 %
Lymphs Abs: 1.3 K/uL (ref 0.7–4.0)
MCH: 32.3 pg (ref 26.0–34.0)
MCHC: 32.9 g/dL (ref 30.0–36.0)
MCV: 98.2 fL (ref 80.0–100.0)
Monocytes Absolute: 0.9 K/uL (ref 0.1–1.0)
Monocytes Relative: 9 %
Neutro Abs: 7.3 K/uL (ref 1.7–7.7)
Neutrophils Relative %: 75 %
Platelets: 433 K/uL — ABNORMAL HIGH (ref 150–400)
RBC: 4.37 MIL/uL (ref 3.87–5.11)
RDW: 13.5 % (ref 11.5–15.5)
WBC: 9.8 K/uL (ref 4.0–10.5)
nRBC: 0 % (ref 0.0–0.2)

## 2024-04-18 LAB — BASIC METABOLIC PANEL WITH GFR
Anion gap: 13 (ref 5–15)
BUN: 24 mg/dL — ABNORMAL HIGH (ref 8–23)
CO2: 22 mmol/L (ref 22–32)
Calcium: 9.9 mg/dL (ref 8.9–10.3)
Chloride: 102 mmol/L (ref 98–111)
Creatinine, Ser: 1.13 mg/dL — ABNORMAL HIGH (ref 0.44–1.00)
GFR, Estimated: 50 mL/min — ABNORMAL LOW (ref >60)
Glucose, Bld: 89 mg/dL (ref 70–99)
Potassium: 4.5 mmol/L (ref 3.5–5.1)
Sodium: 137 mmol/L (ref 135–145)

## 2024-04-18 LAB — HEPATIC FUNCTION PANEL
ALT: 9 U/L (ref 0–44)
AST: 22 U/L (ref 15–41)
Albumin: 3.9 g/dL (ref 3.5–5.0)
Alkaline Phosphatase: 82 U/L (ref 38–126)
Bilirubin, Direct: 0.1 mg/dL (ref 0.0–0.2)
Indirect Bilirubin: 0.3 mg/dL (ref 0.3–0.9)
Total Bilirubin: 0.4 mg/dL (ref 0.0–1.2)
Total Protein: 7.1 g/dL (ref 6.5–8.1)

## 2024-04-18 LAB — LIPASE, BLOOD: Lipase: 35 U/L (ref 11–51)

## 2024-04-18 LAB — CBG MONITORING, ED: Glucose-Capillary: 94 mg/dL (ref 70–99)

## 2024-04-18 LAB — MAGNESIUM: Magnesium: 1.9 mg/dL (ref 1.7–2.4)

## 2024-04-18 LAB — TROPONIN T, HIGH SENSITIVITY: Troponin T High Sensitivity: <15 ng/L (ref 0–19)

## 2024-04-18 MED ORDER — IOHEXOL 350 MG/ML SOLN
100.0000 mL | Freq: Once | INTRAVENOUS | Status: AC | PRN
  Administered 2024-04-18: 100 mL via INTRAVENOUS

## 2024-04-18 MED ORDER — METHOCARBAMOL 500 MG PO TABS: 500.0000 mg | ORAL_TABLET | Freq: Three times a day (TID) | ORAL | 0 refills | Status: DC | PRN

## 2024-04-18 MED ORDER — LIDOCAINE 5 % EX PTCH
1.0000 | MEDICATED_PATCH | CUTANEOUS | Status: DC
  Administered 2024-04-18: 1 via TRANSDERMAL
  Filled 2024-04-18: qty 1

## 2024-04-18 MED ORDER — LIDOCAINE 5 % EX PTCH: 1.0000 | MEDICATED_PATCH | CUTANEOUS | 0 refills | Status: DC

## 2024-04-18 NOTE — ED Provider Notes (Signed)
 [place] EMERGENCY DEPARTMENT AT [facility] Provider Note   CSN: [id number] Arrival date & time: 04/18/24  1258     Patient presents with: Shelly Maldonado   Shelly Maldonado is a 78 y.o. female.    Fall  Patient presents for pain in back and right knee.  Medical history includes atrial fibrillation, HTN, CKD.  2 weeks ago, she was having issues with blood pressure and heart rates.  At that time, she began to experience pain between her shoulder blades as well as her right knee.  She was started on losartan .  She feels this may have contributed to a fall that she had a week and a half ago.  After the fall, she had worsened pain in her back and right knee.  She was seen in the A-fib clinic and underwent further medication adjustment.  She felt like her right knee was improving but pain in this area subsequently worsened.  She describes her back pain is worsened when she is up and moving around.  She says that she is up moving for 15 minutes, pain will become excruciating, and she will need to sit and rest.  Currently, at rest, her pain is minimal.  She is prescribed Eliquis .      Prior to Admission medications   Medication Sig Start Date End Date Taking? Authorizing Provider  lidocaine  (LIDODERM ) 5 % Place 1 patch onto the skin daily. Remove & Discard patch within 12 hours or as directed by MD 04/18/24  Yes Melvenia Motto, MD  methocarbamol (ROBAXIN) 500 MG tablet Take 1 tablet (500 mg total) by mouth every 8 (eight) hours as needed for muscle spasms. 04/18/24  Yes Melvenia Motto, MD  acetaminophen  (TYLENOL ) 500 MG tablet Take 500 mg by mouth every 6 (six) hours as needed for moderate pain or headache. Patient taking differently: Take 500 mg by mouth in the morning and at bedtime.    [provider]  amLODipine  (NORVASC ) 5 MG tablet Take 1 tablet (5 mg total) by mouth in the morning and at bedtime. 03/23/24 06/21/24  Mallipeddi, Vishnu P, MD  ELIQUIS  5 MG TABS tablet Take 1 tablet (5 mg  total) by mouth 2 (two) times daily. 04/06/24   Fenton, Clint R, PA  flecainide  (TAMBOCOR ) 100 MG tablet Take 1 tablet (100 mg total) by mouth 2 (two) times daily. 03/01/24   Fenton, Clint R, PA  losartan  (COZAAR ) 25 MG tablet Take 1 tablet (25 mg total) by mouth daily. 03/17/24   Fenton, Clint R, PA  metoprolol  tartrate (LOPRESSOR ) 25 MG tablet Take 0.5 tablets (12.5 mg total) by mouth 2 (two) times daily. 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    [provider]  Multiple Vitamins-Minerals (HAIR SKIN & NAILS) TABS Take 1 tablet by mouth daily.    [provider]    Allergies: Codeine, Morphine  and codeine, and Latex    Review of Systems  Musculoskeletal:  Positive for arthralgias, back pain and joint swelling.  Neurological:  Positive for light-headedness.  All other systems reviewed and are negative.   Updated Vital Signs BP (!) 141/71   Pulse (!) 58   Temp 98.4 F (36.9 C) (Oral)   Resp (!) 21   Ht 5' 4.5 (1.638 m)   Wt 79.5 kg   SpO2 95%   BMI 29.61 kg/m   Physical Exam Vitals and nursing note reviewed.  Constitutional:  General: She is not in acute distress.    Appearance: Normal appearance. She is well-developed. She is not ill-appearing, toxic-appearing or diaphoretic.  HENT:     Head: Normocephalic and atraumatic.     Right Ear: External ear normal.     Left Ear: External ear normal.     Nose: Nose normal.     Mouth/Throat:     Mouth: Mucous membranes are moist.  Eyes:     Extraocular Movements: Extraocular movements intact.     Conjunctiva/sclera: Conjunctivae normal.  Cardiovascular:     Rate and Rhythm: Normal rate and regular rhythm.  Pulmonary:     Effort: Pulmonary effort is normal. No respiratory distress.  Abdominal:     General: There is no distension.     Palpations: Abdomen is soft.     Tenderness: There is no abdominal tenderness.   Musculoskeletal:        General: Swelling and tenderness present. No deformity. Normal range of motion.     Cervical back: Normal range of motion and neck supple.  Skin:    General: Skin is warm and dry.     Coloration: Skin is not jaundiced or pale.  Neurological:     General: No focal deficit present.     Mental Status: She is alert and oriented to person, place, and time.     Cranial Nerves: No cranial nerve deficit.     Sensory: No sensory deficit.     Motor: No weakness.     Coordination: Coordination normal.  Psychiatric:        Mood and Affect: Mood normal.        Behavior: Behavior normal.     (all labs ordered are listed, but only abnormal results are displayed) Labs Reviewed  BASIC METABOLIC PANEL WITH GFR - Abnormal; Notable for the following components:      Result Value   BUN 24 (*)    Creatinine, Ser 1.13 (*)    GFR, Estimated 50 (*)    All other components within normal limits  CBC WITH DIFFERENTIAL/PLATELET - Abnormal; Notable for the following components:   Platelets 433 (*)    All other components within normal limits  MAGNESIUM  LIPASE, BLOOD  HEPATIC FUNCTION PANEL  URINALYSIS, ROUTINE W REFLEX MICROSCOPIC  CBG MONITORING, ED  TROPONIN T, HIGH SENSITIVITY    EKG: EKG Interpretation Date/Time:  Monday April 18 2024 15:10:31 EDT Ventricular Rate:  60 PR Interval:  219 QRS Duration:  108 QT Interval:  430 QTC Calculation: 430 R Axis:   41  Text Interpretation: Sinus rhythm Borderline prolonged PR interval Abnormal R-wave progression, early transition Confirmed by Melvenia Motto ([id number]) on 04/18/2024 4:23:13 PM  Radiology: CT Cervical Spine Wo Contrast Result Date: 04/18/2024 CLINICAL DATA:  Neck trauma (Age >= 65y) Pt arrived via POV from home c/o pain from injuries sustained from a fall at home 2 weeks ago. EXAM: CT CERVICAL SPINE WITHOUT CONTRAST TECHNIQUE: Multidetector CT imaging of the cervical spine was performed without intravenous contrast.  Multiplanar CT image reconstructions were also generated. RADIATION DOSE REDUCTION: This exam was performed according to the departmental dose-optimization program which includes automated exposure control, adjustment of the mA and/or kV according to patient size and/or use of iterative reconstruction technique. COMPARISON:  None Available. FINDINGS: Alignment: Normal. Skull base and vertebrae: Multilevel mild-to-moderate facet arthropathy. No acute fracture. No aggressive appearing focal osseous lesion or focal pathologic process. Soft tissues and spinal canal: No prevertebral fluid or swelling. No visible canal  hematoma. Upper chest: Nonspecific thin walled cystic lesion at the left apex. Other: Atherosclerotic plaque of the carotid arteries within the neck. IMPRESSION: No acute displaced fracture or traumatic listhesis of the cervical spine. Electronically Signed   By: Morgane  Naveau M.D.   On: 04/18/2024 19:42   CT T-SPINE NO CHARGE Result Date: 04/18/2024 EXAM: CT THORACIC SPINE WITHOUT CONTRAST 04/18/2024 92:70:71 PM TECHNIQUE: CT of the thoracic spine was performed without the administration of intravenous contrast. Multiplanar reformatted images are provided for review. Automated exposure control, iterative reconstruction, and/or weight based adjustment of the mA/kV was utilized to reduce the radiation dose to as low as reasonably achievable. COMPARISON: None available. CLINICAL HISTORY: Pt arrived via POV from home c/o pain from injuries sustained from a fall at home 2 weeks ago. Pt reports her BP has been high and her HR has been low lately and reports when she fell she injured her right knee. Pt also reports Hx of back pain that has flared up as well. FINDINGS: BONES AND ALIGNMENT: Normal thoracic kyphosis. No acute fracture or listhesis of the thoracic spine. Vertebral body height is preserved. DEGENERATIVE CHANGES: No significant degenerative changes. SOFT TISSUES: No acute abnormality.  LIMITATIONS/ARTIFACTS: The examination is markedly limited due to low-dose technique which obscures fine bony detail. Note: Please refer to the concurrently performed CT examination of the chest, abdomen, and pelvis report for further details. IMPRESSION: 1. No acute abnormality of the thoracic spine. 2. Evaluation is limited by low-dose technique. Electronically signed by: Dorethia Molt MD 04/18/2024 07:42 PM EDT RP Workstation: [id number]   CT Angio Chest/Abd/Pel for Dissection W and/or Wo Contrast Result Date: 04/18/2024 CLINICAL DATA:  Acute aortic syndrome suspected. Patient fell at home 2 weeks ago. High blood pressure with decreased heart rate. EXAM: CT ANGIOGRAPHY CHEST, ABDOMEN AND PELVIS TECHNIQUE: Non-contrast CT of the chest was initially obtained. Multidetector CT imaging through the chest, abdomen and pelvis was performed using the standard protocol during bolus administration of intravenous contrast. Multiplanar reconstructed images and MIPs were obtained and reviewed to evaluate the vascular anatomy. RADIATION DOSE REDUCTION: This exam was performed according to the departmental dose-optimization program which includes automated exposure control, adjustment of the mA and/or kV according to patient size and/or use of iterative reconstruction technique. CONTRAST:  OMNIPAQUE  IOHEXOL  350 MG/ML SOLN COMPARISON:  Chest radiograph 04/18/2024. CT chest 11/08/2021. CT abdomen pelvis 10/21/2021 FINDINGS: CTA CHEST FINDINGS Cardiovascular: Unenhanced images of the chest demonstrate scattered calcification of the aorta. No evidence of acute intramural hematoma. Images obtained during the arterial phase after intravenous contrast material administration demonstrate normal caliber thoracic aorta. No aortic dissection. Great vessel origins are patent. Central pulmonary arteries are well opacified. No evidence of significant pulmonary embolus. Normal heart size. No pericardial effusions. Mediastinum/Nodes:  No enlarged mediastinal, hilar, or axillary lymph nodes. Thyroid  gland, trachea, and esophagus demonstrate no significant findings. Small esophageal hiatal hernia. Lungs/Pleura: Mild dependent atelectasis in the lung bases. No airspace disease or consolidation in the lungs. No pleural effusion or pneumothorax. Musculoskeletal: Postoperative changes in the left shoulder. Normal alignment of the thoracic spine. No vertebral compression deformities. Sternum and ribs are nondepressed. Review of the MIP images confirms the above findings. CTA ABDOMEN AND PELVIS FINDINGS VASCULAR Aorta: Normal caliber aorta without aneurysm, dissection, vasculitis or significant stenosis. Celiac: Patent without evidence of aneurysm, dissection, vasculitis or significant stenosis. SMA: Patent without evidence of aneurysm, dissection, vasculitis or significant stenosis. Renals: Both renal arteries are patent without evidence of aneurysm, dissection, vasculitis, fibromuscular  dysplasia or significant stenosis. IMA: Patent without evidence of aneurysm, dissection, vasculitis or significant stenosis. Inflow: Patent without evidence of aneurysm, dissection, vasculitis or significant stenosis. Veins: No obvious venous abnormality within the limitations of this arterial phase study. Review of the MIP images confirms the above findings. NON-VASCULAR Hepatobiliary: No focal liver abnormality is seen. Status post cholecystectomy. No biliary dilatation. Pancreas: Unremarkable. No pancreatic ductal dilatation or surrounding inflammatory changes. Spleen: Normal in size without focal abnormality. Adrenals/Urinary Tract: Adrenal glands are unremarkable. Kidneys are normal, without renal calculi, focal lesion, or hydronephrosis. Bladder is unremarkable. Stomach/Bowel: Stomach, small bowel, and colon are not abnormally distended. No wall thickening or inflammatory stranding. Appendix is not identified. Lymphatic: No significant lymphadenopathy.  Reproductive: Status post hysterectomy. No adnexal masses. Other: No abdominal wall hernia or abnormality. No abdominopelvic ascites. Musculoskeletal: Degenerative changes in the spine. Normal alignment of the lumbar spine. No vertebral compression deformities. Sacrum, pelvis, and hips appear intact. Review of the MIP images confirms the above findings. IMPRESSION: 1. Mild diffuse aortic atherosclerosis. No evidence of aneurysm or dissection involving the thoracic or abdominal aorta. 2. No evidence of significant pulmonary embolus. 3. No evidence of active pulmonary disease. 4. No acute process demonstrated in the abdomen or pelvis.  The Electronically Signed   By: Elsie Gravely M.D.   On: 04/18/2024 19:42   CT L-SPINE NO CHARGE Result Date: 04/18/2024 CLINICAL DATA:  Pt arrived via POV from home c/o pain from injuries sustained from a fall at home 2 weeks ago. Pt reports her BP has been high and her HR has been low lately and reports when she fell she injured her right knee. EXAM: CT LUMBAR SPINE WITHOUT CONTRAST TECHNIQUE: Multidetector CT imaging of the lumbar spine was performed without intravenous contrast administration. Multiplanar CT image reconstructions were also generated. RADIATION DOSE REDUCTION: This exam was performed according to the departmental dose-optimization program which includes automated exposure control, adjustment of the mA and/or kV according to patient size and/or use of iterative reconstruction technique. COMPARISON:  CT angiography chest abdomen pelvis 04/18/2024, CT abdomen pelvis 10/21/2021 FINDINGS: Segmentation: 5 lumbar type vertebrae. Alignment: Stable mild retrolisthesis of L2 on L3. Vertebrae: No acute fracture or aggressive appearing focal pathologic process. Stable chronic L2 vertebral body sclerotic density. Paraspinal and other soft tissues: Negative. Disc levels: Maintained. IMPRESSION: No acute displaced fracture or traumatic listhesis of the lumbar spine.  Electronically Signed   By: Morgane  Naveau M.D.   On: 04/18/2024 19:39   CT Head Wo Contrast Result Date: 04/18/2024 CLINICAL DATA:  Fall at home 2 weeks ago. EXAM: CT HEAD WITHOUT CONTRAST TECHNIQUE: Contiguous axial images were obtained from the base of the skull through the vertex without intravenous contrast. RADIATION DOSE REDUCTION: This exam was performed according to the departmental dose-optimization program which includes automated exposure control, adjustment of the mA and/or kV according to patient size and/or use of iterative reconstruction technique. COMPARISON:  None Available. FINDINGS: Brain: No intracranial hemorrhage, mass effect, or midline shift. Normal for age after no hydrocephalus. The basilar cisterns are patent. Mild periventricular and deep white matter hypodensity, typically chronic small vessel ischemia no evidence of territorial infarct or acute ischemia. No extra-axial or intracranial fluid collection. Vascular: Atherosclerosis of skullbase vasculature without hyperdense vessel or abnormal calcification. Skull: No fracture or focal lesion. Sinuses/Orbits: Paranasal sinuses and mastoid air cells are clear. The visualized orbits are unremarkable. Other: None. IMPRESSION: 1. No acute intracranial abnormality. No skull fracture. 2. Mild chronic small vessel ischemia. Electronically Signed  By: Andrea Gasman M.D.   On: 04/18/2024 19:38   DG Chest Portable 1 View Result Date: 04/18/2024 EXAM: 1 VIEW(S) XRAY OF THE CHEST 04/18/2024 03:05:00 PM COMPARISON: 03/07/2024 CLINICAL HISTORY: Pt arrived via POV from home c/o pain from injuries sustained from a fall at home 2 weeks ago. Pt reports her BP has been high and her HR has been low lately and reports when she fell she injured her right knee. Pt also reports Hx of back pain that has flared up as well. FINDINGS: LUNGS AND PLEURA: Mild elevation of the left hemidiaphragm. No focal pulmonary opacity. No pulmonary edema. No pleural  effusion. No pneumothorax. HEART AND MEDIASTINUM: Atherosclerotic calcification of the aortic arch. BONES AND SOFT TISSUES: Total left shoulder arthroplasty. No acute osseous abnormality. IMPRESSION: 1. No acute abnormalities. Electronically signed by: Vanetta Chou MD 04/18/2024 03:49 PM EDT RP Workstation: [id number]   DG Knee Complete 4 Views Right Result Date: 04/18/2024 CLINICAL DATA:  Fall, injury. EXAM: RIGHT KNEE - COMPLETE 4+ VIEW COMPARISON:  None Available. FINDINGS: Small joint effusion.  No fracture. IMPRESSION: Small joint effusion.  No fracture. Electronically Signed   By: Newell Eke M.D.   On: 04/18/2024 14:04     Procedures   Medications Ordered in the ED  lidocaine  (LIDODERM ) 5 % 1 patch (has no administration in time range)  iohexol  (OMNIPAQUE ) 350 MG/ML injection 100 mL (100 mLs Intravenous Contrast Given 04/18/24 1812)                                    Medical Decision Making Amount and/or Complexity of Data Reviewed Labs: ordered. Radiology: ordered.  Risk Prescription drug management.   This patient presents to the ED for concern of back pain and knee pain, this involves an extensive number of treatment options, and is a complaint that carries with it a high risk of complications and morbidity.  The differential diagnosis includes acute injuries, aortic dissection, pancreatitis, cholecystitis, musculoskeletal etiology   Co morbidities / Chronic conditions that complicate the patient evaluation  atrial fibrillation, HTN, CKD   Additional history obtained:  Additional history obtained from EMR External records from outside source obtained and reviewed including N/A   Lab Tests:  I Ordered, and personally interpreted labs.  The pertinent results include: Normal hemoglobin, no leukocytosis, normal kidney function, normal electrolytes, normal hepatobiliary enzymes, normal lipase, normal troponin   Imaging Studies ordered:  I ordered imaging  studies including x-ray of chest and right knee; CT of head, cervical spine, T-spine, L-spine, CTA of chest, abdomen, pelvis I independently visualized and interpreted imaging which showed no acute findings I agree with the radiologist interpretation   Cardiac Monitoring: / EKG:  The patient was maintained on a cardiac monitor.  I personally viewed and interpreted the cardiac monitored which showed an underlying rhythm of: Sinus rhythm   Problem List / ED Course / Critical interventions / Medication management  Patient presenting for multiple complaints.  She has had ongoing right knee pain for the past several weeks.  X-ray was obtained prior to patient being bedded in the ED.  Results show mild joint effusion with no other acute findings.  On exam, this knee is slightly swollen with minimal tenderness.  There is no erythema or warmth.  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  Patient also describes a pain between her  shoulder blades that is worsened with movement and exertion.  Will obtain troponins for possible anginal equivalent.  Further lab work was initiated.  Patient declines any pain medication at this time.  Patient's lab work, including troponin, is reassuring.  She underwent CT imaging, including CTA of chest, abdomen, pelvis which did not show any acute findings.  There were no findings concerning for acute osseous injury.  Patient remained in a normal sinus rhythm with occasional PVCs while in the ED.  Patient was informed of her reassuring workup.  She was advised to take multimodal pain control at home and to follow-up with orthopedic surgery.  After discussing further, patient states that she has had this similar back pain going on for the past 2 years.  She feels like it improves after cardioversion, however, she is currently in a sinus rhythm.  Given this questionable back pain history, will place cardiology referral as well.  At this  time, patient is stable for discharge. I ordered medication including lidocaine  patch for analgesia Reevaluation of the patient after these medicines showed that the patient stayed the same I have reviewed the patients home medicines and have made adjustments as needed  Social Determinants of Health:  Lives independently     Final diagnoses:  Bilateral thoracic back pain, unspecified chronicity  Acute pain of right knee    ED Discharge Orders          Ordered    lidocaine  (LIDODERM ) 5 %  Every 24 hours        04/18/24 2026    methocarbamol (ROBAXIN) 500 MG tablet  Every 8 hours PRN        04/18/24 2026    Ambulatory referral to Cardiology       Comments: If you have not heard from the Cardiology office within the next 72 hours please call [phone].   04/18/24 2027               Melvenia Motto, MD 04/18/24 2032

## 2024-04-18 NOTE — ED Triage Notes (Signed)
 Pt arrived via POV from home c/o pain from injuries sustained from a fall at home 2 weeks ago. Pt reports her BP has been high and her HR has been low lately and reports when she fell she injured her right knee. Pt also reports Hx of back pain that has flared up as well.

## 2024-04-18 NOTE — Discharge Instructions (Addendum)
 Your test results today are reassuring.  Prescriptions for lidocaine  patches and a muscle relaxer were sent to your pharmacy.  Lidocaine  patches can be switched out daily.  Muscle relaxer can be taken as needed.  Continue your home Tylenol .  These medications should help your back pain.  Avoid activities that worsen your knee pain.  Follow-up with orthopedic surgery regarding your knee pain.  Telephone number is below.  Given concern of possible cardiac relation to your back pain, a cardiology referral has been ordered as well.  If you do not hear from them in the next few days, call the telephone number below.  Return to the emergency department for any new or worsening symptoms or concerns.

## 2024-04-18 NOTE — ED Notes (Signed)
Attempted x 2 for IV access without success.  

## 2024-05-05 ENCOUNTER — Other Ambulatory Visit: Payer: Self-pay | Admitting: Orthopedic Surgery

## 2024-05-05 ENCOUNTER — Telehealth: Payer: Self-pay

## 2024-05-05 ENCOUNTER — Encounter: Payer: Self-pay | Admitting: Orthopedic Surgery

## 2024-05-05 ENCOUNTER — Ambulatory Visit (INDEPENDENT_AMBULATORY_CARE_PROVIDER_SITE_OTHER): Admitting: Orthopedic Surgery

## 2024-05-05 VITALS — BP 134/68 | Ht 64.5 in | Wt 175.0 lb

## 2024-05-05 DIAGNOSIS — M1711 Unilateral primary osteoarthritis, right knee: Secondary | ICD-10-CM | POA: Diagnosis not present

## 2024-05-05 DIAGNOSIS — M25461 Effusion, right knee: Secondary | ICD-10-CM | POA: Diagnosis not present

## 2024-05-05 MED ORDER — METHOCARBAMOL 500 MG PO TABS: 500.0000 mg | ORAL_TABLET | Freq: Three times a day (TID) | ORAL | 0 refills | Status: DC | PRN

## 2024-05-05 NOTE — Progress Notes (Signed)
 Office Visit Note   Patient: Shelly Maldonado           Date of Birth: 23-Nov-1945           MRN: [id number] Visit Date: 05/05/2024 Requested by: Orpha Yancey LABOR, MD [address] [place],  [place] [address] PCP: Orpha Yancey LABOR, MD   Assessment & Plan:   Encounter Diagnoses  Name Primary?   Effusion of right knee Yes   Primary osteoarthritis of right knee     No orders of the defined types were placed in this encounter.   78 year old female on Eliquis  she started having knee pain sometime in August she has had a few falls since then presented with complaints of right knee pain intermittent swelling and difficulty getting out of a chair despite benign x-rays  MRI ordered to evaluate intra-articular structures for internal derangement   Subjective: Chief Complaint  Patient presents with   Knee Pain    Right/ went to ER due to pain / fell     HPI: 78 year old female chief complaint right knee pain.  The patient did fall limited to the ER and was evaluated for that x-ray showed arthritis but it was mild.  However in August she was having pain in the knee even before the fall.  She noticed difficulty getting out of a chair.  She noticed intermittent swelling in the knee and trouble walking when the knee was swollen              ROS: Upper thoracic back pain, intermittent knee effusion, denies back pain in the lower back   Images personally read and my interpretation : Outside images show mild arthritis of the right knee  Visit Diagnoses:  1. Effusion of right knee   2. Primary osteoarthritis of right knee      Follow-Up Instructions: Return for FOLLOW UP, MRI RESULTS.    Objective: Vital Signs: BP 134/68 Comment: 04/18/24  Ht 5' 4.5 (1.638 m)   Wt 175 lb (79.4 kg)   BMI 29.57 kg/m   Physical Exam Vitals and nursing note reviewed.  Constitutional:      Appearance: Normal appearance.  HENT:     Head: Normocephalic and atraumatic.  Eyes:     General: No scleral  icterus.       Right eye: No discharge.        Left eye: No discharge.     Extraocular Movements: Extraocular movements intact.     Conjunctiva/sclera: Conjunctivae normal.     Pupils: Pupils are equal, round, and reactive to light.  Cardiovascular:     Rate and Rhythm: Normal rate.     Pulses: Normal pulses.  Skin:    General: Skin is warm and dry.     Capillary Refill: Capillary refill takes less than 2 seconds.  Neurological:     General: No focal deficit present.     Mental Status: She is alert and oriented to person, place, and time.  Psychiatric:        Mood and Affect: Mood normal.        Behavior: Behavior normal.        Thought Content: Thought content normal.        Judgment: Judgment normal.      Ortho Exam  Examination of the right knee shows that she has Skin abrasions anteriorly She is wearing pain patches Salonpas She has tenderness over the medial soft tissue sleeve including the pes tendons and bursa She has passive  painless range of motion of the knee which is full she can fully extend the knee with Normal extension power No instability in any plane  Specialty Comments:  No specialty comments available.  Imaging: No results found.   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   Past Medical History:  Diagnosis Date   Atrial fibrillation (HCC)    Hypertension    Pancreatitis    Shingles     Family History  Problem Relation Age of Onset   Cancer Mother    Heart attack Father    Colon cancer Neg Hx     Past Surgical History:  Procedure Laterality Date   ATRIAL FIBRILLATION ABLATION N/A 06/06/2022   Procedure: ATRIAL FIBRILLATION ABLATION;  Surgeon: Cindie Ole DASEN, MD;  Location: [facility] INVASIVE CV LAB;  Service: Cardiovascular;  Laterality: N/A;   CARDIOVERSION N/A 01/02/2022   Procedure: CARDIOVERSION;  Surgeon: Mona Vinie BROCKS, MD;  Location: [facility] ENDOSCOPY;  Service: Cardiovascular;  Laterality: N/A;   CARDIOVERSION N/A 07/01/2022   Procedure: CARDIOVERSION;  Surgeon: Lonni Slain, MD;  Location: [facility] ENDOSCOPY;  Service: Cardiovascular;  Laterality: N/A;   CARDIOVERSION N/A 11/25/2023   Procedure: CARDIOVERSION;  Surgeon: Lonni Slain, MD;  Location: [facility] INVASIVE CV LAB;  Service: Cardiovascular;  Laterality: N/A;   CARDIOVERSION N/A 03/02/2024   Procedure: CARDIOVERSION;  Surgeon: Francyne Headland, MD;  Location: [facility] INVASIVE CV LAB;  Service: Cardiovascular;  Laterality: N/A;   CATARACT EXTRACTION Bilateral 2016   CHOLECYSTECTOMY     COLONOSCOPY N/A 03/04/2018   Procedure: COLONOSCOPY;  Surgeon: Golda Claudis PENNER, MD;  Location: [facility] ENDO SUITE;  Service: Endoscopy;  Laterality: N/A;  12:45   complete hysterecomy     Social History   Occupational History   Not on file  Tobacco Use   Smoking status: Former   Smokeless tobacco: Never   Tobacco comments:    Former smoker 12/25/21 smoked 48 yrs ago.   Vaping Use   Vaping status: Never Used   Substance and Sexual Activity   Alcohol use: Never   Drug use: Never   Sexual activity: Not on file

## 2024-05-05 NOTE — Addendum Note (Signed)
 Addended by: VICENTA EMMIE HERO on: 05/05/2024 03:57 PM   Modules accepted: Orders

## 2024-05-05 NOTE — Progress Notes (Signed)
  Intake history:  Chief Complaint  Patient presents with   Knee Pain    Right/ went to ER due to pain / fell      BP 134/68 Comment: 04/18/24  Ht 5' 4.5 (1.638 m)   Wt 175 lb (79.4 kg)   BMI 29.57 kg/m  Body mass index is 29.57 kg/m.  Pharmacy? ________[facility]______________  WHAT ARE WE SEEING YOU FOR TODAY?   Right knee   How long has this bothered you? (DOI?DOS?WS?)  2 weeks prior to ER visit on October 6 roughly September 22  Clemens / new RX for BP made her fall   Was there an injury? Yes ?? 03/17/24 she states last time she went to AFIB clinic (poor historian)  Anticoag.  Yes  Diabetes No  Heart disease Yes  Hypertension Yes  SMOKING HX No  Kidney disease Yes  Any ALLERGIES ____________________ Allergies  Allergen Reactions   Codeine Other (See Comments)    Unknown reaction, tolerates hydrocodone    Morphine  And Codeine     Vein became red   Latex Other (See Comments)    Irritation   _________________________   Treatment:  Have you taken:  Tylenol  Yes  Advil No  Had PT No  Had injection No  Other  _________________________

## 2024-05-05 NOTE — Patient Instructions (Signed)
 While we are working on your approval for MRI please go ahead and call to schedule your appointment with [facility] within at least one (1) week.   Central Scheduling [phone]

## 2024-05-05 NOTE — Telephone Encounter (Signed)
 Pt forgot to mention to you that she needs some more muscle relaxers.   Methocarbamol  500 MG  Qty 20 Tablets   PATIENT USES [facility]

## 2024-05-08 ENCOUNTER — Ambulatory Visit (HOSPITAL_COMMUNITY)
Admission: RE | Admit: 2024-05-08 | Discharge: 2024-05-08 | Disposition: A | Discharge status: Home / Self Care | Source: Ambulatory Visit | Attending: Orthopedic Surgery | Admitting: Orthopedic Surgery

## 2024-05-08 DIAGNOSIS — M1711 Unilateral primary osteoarthritis, right knee: Secondary | ICD-10-CM | POA: Diagnosis not present

## 2024-05-08 DIAGNOSIS — M25461 Effusion, right knee: Secondary | ICD-10-CM | POA: Diagnosis not present

## 2024-05-12 ENCOUNTER — Ambulatory Visit (INDEPENDENT_AMBULATORY_CARE_PROVIDER_SITE_OTHER): Admitting: Orthopedic Surgery

## 2024-05-12 DIAGNOSIS — M65969 Unspecified synovitis and tenosynovitis, unspecified lower leg: Secondary | ICD-10-CM

## 2024-05-12 DIAGNOSIS — M1711 Unilateral primary osteoarthritis, right knee: Secondary | ICD-10-CM | POA: Diagnosis not present

## 2024-05-12 DIAGNOSIS — M25461 Effusion, right knee: Secondary | ICD-10-CM

## 2024-05-12 DIAGNOSIS — M65961 Unspecified synovitis and tenosynovitis, right lower leg: Secondary | ICD-10-CM

## 2024-05-12 DIAGNOSIS — M25061 Hemarthrosis, right knee: Secondary | ICD-10-CM

## 2024-05-12 MED ORDER — METHYLPREDNISOLONE ACETATE 40 MG/ML IJ SUSP
40.0000 mg | Freq: Once | INTRAMUSCULAR | Status: AC
Start: 2024-05-12 — End: 2024-05-12
  Administered 2024-05-12: 40 mg via INTRA_ARTICULAR

## 2024-05-12 NOTE — Patient Instructions (Signed)
 Apply ice to the knee for 20 minutes 4 times a day   Start therapy to increase strength and regain ability to ambulate    Wear knee brace when walking   Joint Steroid Injection A joint steroid injection is a procedure to relieve swelling and pain in a joint. Steroids are medicines that reduce inflammation. In this procedure, your health care provider uses a syringe and a needle to inject a steroid medicine into a painful and inflamed joint. A pain-relieving medicine (anesthetic) may be injected along with the steroid. In some cases, your health care provider may use an imaging technique such as ultrasound or fluoroscopy to guide the injection. Joints that are often treated with steroid injections include the knee, shoulder, hip, and spine. These injections may also be used in the elbow, ankle, and joints of the hands or feet. You may have joint steroid injections as part of your treatment for inflammation caused by: Gout. Rheumatoid arthritis. Advanced wear-and-tear arthritis (osteoarthritis). Tendinitis. Bursitis. Joint steroid injections may be repeated, but having them too often can damage a joint or the skin over the joint. You should not have joint steroid injections less than 6 weeks apart or more than four times a year. Tell a health care provider about: Any allergies you have. All medicines you are taking, including vitamins, herbs, eye drops, creams, and over-the-counter medicines. Any problems you or family members have had with anesthetic medicines. Any blood disorders you have. Any surgeries you have had. Any medical conditions you have. Whether you are pregnant or may be pregnant. What are the risks? Generally, this is a safe treatment. However, problems may occur, including: Infection. Bleeding. Allergic reactions to medicines. Damage to the joint or tissues around the joint. Thinning of skin or loss of skin color over the joint. Temporary flushing of the face or  chest. Temporary increase in pain. Temporary increase in blood sugar. Failure to relieve inflammation or pain. What happens before the treatment? Medicines Ask your health care provider about: Changing or stopping your regular medicines. This is especially important if you are taking diabetes medicines or blood thinners. Taking medicines such as aspirin and ibuprofen. These medicines can thin your blood. Do not take these medicines unless your health care provider tells you to take them. Taking over-the-counter medicines, vitamins, herbs, and supplements. General instructions You may have imaging tests of your joint. Ask your health care provider if you can drive yourself home after the procedure. What happens during the treatment?  Your health care provider will position you for the injection and locate the injection site over your joint. The skin over the joint will be cleaned with a germ-killing soap. Your health care provider may: Spray a numbing solution (topical anesthetic) over the injection site. Inject a local anesthetic under the skin above your joint. The needle will be placed through your skin into your joint. Your health care provider may use imaging to guide the needle to the right spot for the injection. If imaging is used, a special contrast dye may be injected to confirm that the needle is in the correct location. The steroid medicine will be injected into your joint. Anesthetic may be injected along with the steroid. This may be a medicine that relieves pain for a short time (short-acting anesthetic) or for a longer time (long-acting anesthetic). The needle will be removed, and an adhesive bandage (dressing) will be placed over the injection site. The procedure may vary among health care providers and hospitals. What can  I expect after the treatment? You will be able to go home after the treatment. It is normal to feel slight flushing for a few days after the  injection. After the treatment, it is common to have an increase in joint pain after the anesthetic has worn off. This may happen about an hour after a short-acting anesthetic or about 8 hours after a longer-acting anesthetic. You should begin to feel relief from joint pain and swelling after 24 to 48 hours. Contact your health care provider if you do not begin to feel relief after 2 days. Follow these instructions at home: Injection site care Leave the adhesive dressing over your injection site in place until your health care provider says you can remove it. Check your injection site every day for signs of infection. Check for: More redness, swelling, or pain. Fluid or blood. Warmth. Pus or a bad smell. Activity Return to your normal activities as told by your health care provider. Ask your health care provider what activities are safe for you. You may be asked to limit activities that put stress on the joint for a few days. Do joint exercises as told by your health care provider. Do not take baths, swim, or use a hot tub until your health care provider approves. Ask your health care provider if you may take showers. You may only be allowed to take sponge baths. Managing pain, stiffness, and swelling  If directed, put ice on the joint. To do this: Put ice in a plastic bag. Place a towel between your skin and the bag. Leave the ice on for 20 minutes, 2-3 times a day. Remove the ice if your skin turns bright red. This is very important. If you cannot feel pain, heat, or cold, you have a greater risk of damage to the area. Raise (elevate) your joint above the level of your heart when you are sitting or lying down. General instructions Take over-the-counter and prescription medicines only as told by your health care provider. Do not use any products that contain nicotine or tobacco, such as cigarettes, e-cigarettes, and chewing tobacco. These can delay joint healing. If you need help quitting,  ask your health care provider. If you have diabetes, be aware that your blood sugar may be slightly elevated for several days after the injection. Keep all follow-up visits. This is important. Contact a health care provider if you have: Chills or a fever. Any signs of infection at your injection site. Increased pain or swelling or no relief after 2 days. Summary A joint steroid injection is a treatment to relieve pain and swelling in a joint. Steroids are medicines that reduce inflammation. Your health care provider may add an anesthetic along with the steroid. You may have joint steroid injections as part of your arthritis treatment. Joint steroid injections may be repeated, but having them too often can damage a joint or the skin over the joint. Contact your health care provider if you have a fever, chills, or signs of infection, or if you get no relief from joint pain or swelling. This information is not intended to replace advice given to you by your health care provider. Make sure you discuss any questions you have with your health care provider. Document Revised: 12/09/2019 Document Reviewed: 12/09/2019 Elsevier Patient Education  2024 [employer].

## 2024-05-12 NOTE — Progress Notes (Signed)
    05/12/2024   Chief Complaint  Patient presents with   Results    R Knee MRI    No diagnosis found.  What pharmacy do you use ? [facility]  DOI/DOS/ Date:  SEPT 29  Did you get better, worse or no change (Answer below)   Unchanged

## 2024-05-12 NOTE — Addendum Note (Signed)
 Addended by: VICENTA EMMIE HERO on: 05/12/2024 12:00 PM   Modules accepted: Orders

## 2024-05-12 NOTE — Progress Notes (Signed)
 FOLLOW-UP OFFICE VISIT   Patient: Shelly Maldonado           Date of Birth: September 27, 1945           MRN: [id number] Visit Date: 05/12/2024 Requested by: Orpha Yancey LABOR, MD [address] [place],  [place] [address] PCP: Orpha Yancey LABOR, MD    Encounter Diagnoses  Name Primary?   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 There is no focal full-thickness cartilage defect. There is a complex joint effusion with synovial hypertrophy. There is a complex slightly leaking Baker's cyst which is moderate in size.   Mild mucoid degeneration the cruciate ligaments without evidence of a tear. Thickening of the  MCL without a tear.   Degenerative changes in the medial and lateral menisci without evidence of a discrete or displaced meniscal tear.   Electronically signed by: Norleen Satchel MD 05/08/2024 04:24 PM EDT RP Workstation: [id number]  ASSESSMENT AND PLAN  Difficult to assess what exactly happened she confirms that her fall date was September 12 She is on anticoagulants (Eliquis ) I suspect she fell bled into the joint and then developed synovitis and persistent effusion  The MRI does not show a tear just arthritis with fluid  I recommended and she agreed to  Attempted aspiration with injection  Hinged knee brace  Recheck for possible repeat aspiration injection right knee in 1 week  Procedure  Procedure :  Aspiration and injection  verbal consent was obtained to aspirate and inject the  right  knee joint   Timeout was completed to confirm the site of aspiration and injection   Medication:  40 mg of Depo-Medrol and 1% lidocaine  3 cc  Anesthesia was provided by ethyl chloride and the skin was prepped with alcohol.  After cleaning the skin with alcohol an 18-gauge needle was used to aspirate the right knee joint.  Fluid obtained: Synovial and bloody fluid 15 cc  Color: Yellow and red some clotted blood  We followed this by injection of 40 mg of Depo-Medrol and 3 cc 1% lidocaine .  There were no complications. A sterile bandage was applied.

## 2024-05-18 ENCOUNTER — Inpatient Hospital Stay: Payer: Medicare Other | Attending: Physician Assistant

## 2024-05-18 DIAGNOSIS — Z86718 Personal history of other venous thrombosis and embolism: Secondary | ICD-10-CM | POA: Insufficient documentation

## 2024-05-18 DIAGNOSIS — K55069 Acute infarction of intestine, part and extent unspecified: Secondary | ICD-10-CM

## 2024-05-18 LAB — CBC WITH DIFFERENTIAL/PLATELET
Abs Immature Granulocytes: 0.07 K/uL (ref 0.00–0.07)
Basophils Absolute: 0.1 K/uL (ref 0.0–0.1)
Basophils Relative: 1 %
Eosinophils Absolute: 0.1 K/uL (ref 0.0–0.5)
Eosinophils Relative: 1 %
HCT: 46.2 % — ABNORMAL HIGH (ref 36.0–46.0)
Hemoglobin: 15.2 g/dL — ABNORMAL HIGH (ref 12.0–15.0)
Immature Granulocytes: 1 %
Lymphocytes Relative: 11 %
Lymphs Abs: 1.4 K/uL (ref 0.7–4.0)
MCH: 32.5 pg (ref 26.0–34.0)
MCHC: 32.9 g/dL (ref 30.0–36.0)
MCV: 98.7 fL (ref 80.0–100.0)
Monocytes Absolute: 1.2 K/uL — ABNORMAL HIGH (ref 0.1–1.0)
Monocytes Relative: 9 %
Neutro Abs: 9.6 K/uL — ABNORMAL HIGH (ref 1.7–7.7)
Neutrophils Relative %: 77 %
Platelets: 477 K/uL — ABNORMAL HIGH (ref 150–400)
RBC: 4.68 MIL/uL (ref 3.87–5.11)
RDW: 13.8 % (ref 11.5–15.5)
WBC: 12.5 K/uL — ABNORMAL HIGH (ref 4.0–10.5)
nRBC: 0 % (ref 0.0–0.2)

## 2024-05-18 LAB — D-DIMER, QUANTITATIVE: D-Dimer, Quant: 1.53 ug{FEU}/mL — ABNORMAL HIGH (ref 0.00–0.50)

## 2024-05-18 LAB — BASIC METABOLIC PANEL WITH GFR
Anion gap: 13 (ref 5–15)
BUN: 27 mg/dL — ABNORMAL HIGH (ref 8–23)
CO2: 26 mmol/L (ref 22–32)
Calcium: 9.7 mg/dL (ref 8.9–10.3)
Chloride: 100 mmol/L (ref 98–111)
Creatinine, Ser: 1.06 mg/dL — ABNORMAL HIGH (ref 0.44–1.00)
GFR, Estimated: 54 mL/min — ABNORMAL LOW (ref >60)
Glucose, Bld: 88 mg/dL (ref 70–99)
Potassium: 4.3 mmol/L (ref 3.5–5.1)
Sodium: 139 mmol/L (ref 135–145)

## 2024-05-19 ENCOUNTER — Ambulatory Visit: Admitting: Orthopedic Surgery

## 2024-05-19 DIAGNOSIS — M25461 Effusion, right knee: Secondary | ICD-10-CM

## 2024-05-19 NOTE — Progress Notes (Signed)
    05/19/2024   Chief Complaint  Patient presents with   Knee Pain    Right/ feeling better     No diagnosis found.  What pharmacy do you use ? ______[facility]_____  DOI/DOS/ fell in August 2025  Did you get better, worse or no change (Answer below)   Improved

## 2024-05-19 NOTE — Progress Notes (Signed)
   Patient: Shelly Maldonado           Date of Birth: 15-Jan-1946           MRN: [id number] Visit Date: 05/19/2024 Requested by: Orpha Yancey LABOR, MD [address] [place],  [place] [address] PCP: Orpha Yancey LABOR, MD  Encounter Diagnosis  Name Primary?   Effusion of right knee Yes    Assessment and plan:  Resolved spontaneous bleed into the right knee joint  Resume all normal activities  No orders of the defined types were placed in this encounter.    Chief Complaint  Patient presents with   Knee Pain    Right/ feeling better     History:  Spontaneous hemarthrosis right knee status post aspiration injection doing well no problems  She can get an every chair in her house and get up without assistance  Focused exam findings:  Right knee no effusion she has regained her range of motion gait no assistance  Return as needed

## 2024-05-20 ENCOUNTER — Ambulatory Visit: Admitting: Orthopedic Surgery

## 2024-05-25 ENCOUNTER — Inpatient Hospital Stay: Payer: Medicare Other | Admitting: Physician Assistant

## 2024-06-06 ENCOUNTER — Ambulatory Visit: Attending: Student | Admitting: Student

## 2024-06-06 ENCOUNTER — Encounter: Payer: Self-pay | Admitting: Student

## 2024-06-06 VITALS — BP 112/70 | HR 64 | Ht 64.5 in | Wt 171.0 lb

## 2024-06-06 DIAGNOSIS — I484 Atypical atrial flutter: Secondary | ICD-10-CM | POA: Diagnosis not present

## 2024-06-06 DIAGNOSIS — I4819 Other persistent atrial fibrillation: Secondary | ICD-10-CM | POA: Diagnosis not present

## 2024-06-06 DIAGNOSIS — D6869 Other thrombophilia: Secondary | ICD-10-CM | POA: Insufficient documentation

## 2024-06-06 DIAGNOSIS — I10 Essential (primary) hypertension: Secondary | ICD-10-CM | POA: Insufficient documentation

## 2024-06-06 NOTE — Progress Notes (Signed)
 Cardiology Office Note    Date:  06/06/2024  ID:  Shelly Maldonado, DOB [birth year], MRN [id number] Cardiologist: Previously Dr. Hobart Cardiology APP:  Johnson Laymon HERO, PA-C  Electrophysiologist:  OLE ONEIDA HOLTS, MD { :  History of Present Illness:    Shelly Maldonado is a 78 y.o. female with past medical history of persistent atrial fibrillation (s/p DCCV in 12/2021 with recurrence later that month, s/p ablation in 05/2022 with recurrence in 06/2022 and underwent repeat DCCV), prior ischemic enteritis and splenic infarct, HTN and chronic pancreatitis who presents to the office today for 57-month follow-up.   She was examined by myself in 01/2024 and had recently undergone successful DCCV in 11/2023. She reported improvement in her stamina and shortness of breath and had been maintaining NSR when checked on her Kardia monitor. No changes were made to her cardiac medications and she was encouraged to check her BP and report back with readings as this was elevated at the time of her visit but she had not yet taken her medications.   In the interim, she did follow-up with the Atrial Fibrillation Clinic and was found to have recurrent atrial flutter with RVR during her office visit in 02/2024. Was started on Flecainide  and ultimately underwent DCCV on 03/02/2024 with successful conversion to normal sinus rhythm. Most recent visit with the Atrial Fibrillation Clinic was on 04/14/2024 and she was maintaining normal sinus rhythm but remained bradycardic with heart rate in the 40's. She was continued on Flecainide  100 mg twice daily and Eliquis  but Lopressor  was reduced to 12.5 mg twice daily.  In talking with the patient today, she reports she was previously having right knee pain after suffering a fall in 04/2024 but underwent aspiration of this by Orthopedics and symptoms significantly improved. At this time, her biggest issue is pain along her bottom tooth and she is scheduled for follow-up with her  dentist tomorrow [facility]). She reports her heart rate has overall been well-controlled and she is unaware of any recurrent episodes of atrial fibrillation or flutter. Says that her blood pressure has improved as well. She was previously having dizziness but this has now resolved. She remains on Eliquis  for anticoagulation with no reports of melena, hematochezia or hematuria. No recent orthopnea, PND or pitting edema.  Studies Reviewed:   EKG: EKG is not ordered today.  Echocardiogram: 10/2021 IMPRESSIONS     1. Left ventricular ejection fraction, by estimation, is 60 to 65%. The  left ventricle has normal function. The left ventricle has no regional  wall motion abnormalities. There is mild left ventricular hypertrophy of  the basal-septal segment. Left  ventricular diastolic function could not be evaluated.   2. Right ventricular systolic function is normal. The right ventricular  size is normal. There is moderately elevated pulmonary artery systolic  pressure. The estimated right ventricular systolic pressure is 50.1 mmHg.   3. The pericardial effusion is anterior to the right ventricle.   4. The mitral valve is normal in structure. Trivial mitral valve  regurgitation. No evidence of mitral stenosis.   5. The aortic valve is normal in structure. Aortic valve regurgitation is  not visualized. No aortic stenosis is present.   6. The inferior vena cava is normal in size with greater than 50%  respiratory variability, suggesting right atrial pressure of 3 mmHg.    Risk Assessment/Calculations:   CHA2DS2-VASc Score = 5   This indicates a 7.2% annual risk of stroke. The patient's score is based upon: CHF  History: 0 HTN History: 1 Diabetes History: 0 Stroke History: 0 Vascular Disease History: 1 Age Score: 2 Gender Score: 1   Physical Exam:   VS:  BP 112/70 (BP Location: Right Arm, Cuff Size: Large)   Pulse 64   Ht 5' 4.5 (1.638 m)   Wt 171 lb (77.6 kg)   SpO2  96%   BMI 28.90 kg/m    Wt Readings from Last 3 Encounters:  06/06/24 171 lb (77.6 kg)  05/05/24 175 lb (79.4 kg)  04/18/24 175 lb 3.2 oz (79.5 kg)     GEN: Pleasant female appearing in no acute distress NECK: No JVD; No carotid bruits CARDIAC: RRR, no murmurs, rubs, gallops RESPIRATORY:  Clear to auscultation without rales, wheezing or rhonchi  ABDOMEN: Appears non-distended. No obvious abdominal masses. EXTREMITIES: No clubbing or cyanosis. No pitting edema.  Distal pedal pulses are 2+ bilaterally.   Assessment and Plan:   1. Persistent atrial fibrillation/Atypical atrial flutter  - She previously underwent ablation in 05/2022 but has had multiple recurrences since with repeat DCCV's with most recent being in 02/2024. She is maintaining normal sinus rhythm by examination today and denies any recent palpitations. Continue Flecainide  100 mg twice daily along with Lopressor  12.5 mg twice daily.  2. Hypercoagulable state due to persistent atrial fibrillation (HCC) - She denies any evidence of active bleeding. Continue Eliquis  5 mg twice daily for anticoagulation which is the appropriate dose given her age (78 yo), weight (171 lbs) and renal function (creatinine at 1.06 when checked on 05/18/2024). Labs earlier this month showed hemoglobin was stable at 15.2 with platelets at 477 K.  - She has follow-up with her dentist tomorrow and is unsure if she will require dental extraction. If a simple (1 tooth) dental extraction, this is considered low-risk from a cardiac perspective and she does not require cardiac clearance for the procedure and there is typically no need to interrupt anticoagulation. Will route today's note to her dental office.  3. Essential hypertension - BP is well-controlled at 112/70 during today's visit. Continue current medical therapy with Amlodipine  5 mg twice daily, Losartan  25 mg daily and Lopressor  12.5 mg twice daily. Encouraged her to continue to follow blood pressure  at home, especially if she has recurrent dizziness. If she is found to have hypotension, would reduce Amlodipine  to 5 mg once daily.   Signed, Laymon CHRISTELLA Qua, PA-C

## 2024-06-06 NOTE — Patient Instructions (Signed)
 Medication Instructions:  Your physician recommends that you continue on your current medications as directed. Please refer to the Current Medication list given to you today.  *If you need a refill on your cardiac medications before your next appointment, please call your pharmacy*  Lab Work: NONE   If you have labs (blood work) drawn today and your tests are completely normal, you will receive your results only by: MyChart Message (if you have MyChart) OR A paper copy in the mail If you have any lab test that is abnormal or we need to change your treatment, we will call you to review the results.  Testing/Procedures: NONE   Follow-Up: At [facility], you and your health needs are our priority.  As part of our continuing mission to provide you with exceptional heart care, our providers are all part of one team.  This team includes your primary Cardiologist (physician) and Advanced Practice Providers or APPs (Physician Assistants and Nurse Practitioners) who all work together to provide you with the care you need, when you need it.  Your next appointment:   6 month(s)  Provider:   Laymon Qua, PA-C    We recommend signing up for the patient portal called MyChart.  Sign up information is provided on this After Visit Summary.  MyChart is used to connect with patients for Virtual Visits (Telemedicine).  Patients are able to view lab/test results, encounter notes, upcoming appointments, etc.  Non-urgent messages can be sent to your provider as well.   To learn more about what you can do with MyChart, go to [email or web address].   Other Instructions Thank you for choosing [facility]!

## 2024-06-11 ENCOUNTER — Other Ambulatory Visit (HOSPITAL_COMMUNITY): Payer: Self-pay | Admitting: Physician Assistant

## 2024-06-22 ENCOUNTER — Other Ambulatory Visit: Payer: Self-pay | Admitting: Physician Assistant

## 2024-06-24 MED ORDER — LOSARTAN POTASSIUM 25 MG PO TABS: 25.0000 mg | ORAL_TABLET | Freq: Every day | ORAL | 3 refills | Status: AC

## 2024-06-27 ENCOUNTER — Encounter: Payer: Self-pay | Admitting: Orthopedic Surgery

## 2024-06-27 ENCOUNTER — Ambulatory Visit: Admitting: Orthopedic Surgery

## 2024-06-27 DIAGNOSIS — M7051 Other bursitis of knee, right knee: Secondary | ICD-10-CM

## 2024-06-27 MED ORDER — METHYLPREDNISOLONE ACETATE 40 MG/ML IJ SUSP
40.0000 mg | Freq: Once | INTRAMUSCULAR | Status: AC
  Administered 2024-06-27: 11:00:00 40 mg via INTRA_ARTICULAR

## 2024-06-27 NOTE — Progress Notes (Signed)
° °  Patient: Shelly Maldonado           Date of Birth: Jul 17, 1945           MRN: [id number] Visit Date: 06/27/2024 Requested by: Orpha Yancey LABOR, MD [address] [place],  [place] [address] PCP: Orpha Yancey LABOR, MD  Encounter Diagnosis  Name Primary?   Pes anserinus bursitis of right knee Yes    Assessment and plan:  78 year old female most likely pes bursitis underlying arthritis right knee  Procedure note right knee injection for bursitis   verbal consent was obtained to inject right knee PES BURSA  Timeout was completed to confirm the site of injection  The medications used were 40 mg of Depo-Medrol  and 1% lidocaine  3 cc  Anesthesia was provided by ethyl chloride and the skin was prepped with alcohol.  After cleaning the skin with alcohol a 25-gauge needle was used to inject the right knee bursa.  There were no complications and a sterile bandage was applied    We recommend  Ice 3 times a day  Topical menthol 3 times a day such as Bengay  Cortisone injection  Recheck 4 weeks  Meds ordered this encounter  Medications   methylPREDNISolone  acetate (DEPO-MEDROL ) injection 40 mg     Chief Complaint  Patient presents with   Knee Pain    Right/ gave out last week when she was going to bathroom, has improved some since, but still has pain     History:  78 year old female history of right knee pain seen here in the office had MRI and x-rays she has arthritis of the joint had some synovitis no meniscal or ligamentous tears  Patient's knee gave out as she turned the corner of her bed on the way to the bathroom.  She had immediate pain had difficulty ambulating but is starting to get better  Focused exam findings:  Reexamination of the major ligaments of the knee show no instability or laxity  Range of motion seems normal  Tenderness over the pes bursa with mild swelling    No results found.

## 2024-06-27 NOTE — Progress Notes (Signed)
° ° °  06/27/2024   Chief Complaint  Patient presents with   Knee Pain    Right/ gave out last week when she was going to bathroom, has improved some since, but still has pain     No diagnosis found.  What pharmacy do you use ? [facility]_________  DOI/DOS/ Date: worse past week   Did you get better, worse or no change (Answer below)   Worse

## 2024-07-11 ENCOUNTER — Encounter: Payer: Self-pay | Admitting: *Deleted

## 2024-07-21 ENCOUNTER — Ambulatory Visit (HOSPITAL_COMMUNITY)
Admission: RE | Admit: 2024-07-21 | Discharge: 2024-07-21 | Disposition: A | Discharge status: Home / Self Care | Source: Ambulatory Visit | Attending: Physician Assistant | Admitting: Physician Assistant

## 2024-07-21 VITALS — BP 122/70 | HR 48 | Ht 64.5 in | Wt 172.7 lb

## 2024-07-21 DIAGNOSIS — Z79899 Other long term (current) drug therapy: Secondary | ICD-10-CM | POA: Insufficient documentation

## 2024-07-21 DIAGNOSIS — I4819 Other persistent atrial fibrillation: Secondary | ICD-10-CM | POA: Diagnosis present

## 2024-07-21 DIAGNOSIS — I4891 Unspecified atrial fibrillation: Secondary | ICD-10-CM | POA: Insufficient documentation

## 2024-07-21 DIAGNOSIS — D6869 Other thrombophilia: Secondary | ICD-10-CM | POA: Insufficient documentation

## 2024-07-21 DIAGNOSIS — Z5181 Encounter for therapeutic drug level monitoring: Secondary | ICD-10-CM | POA: Diagnosis present

## 2024-07-21 DIAGNOSIS — I484 Atypical atrial flutter: Secondary | ICD-10-CM | POA: Insufficient documentation

## 2024-07-21 NOTE — Progress Notes (Signed)
 "   Primary Care Physician: Orpha Yancey LABOR, MD Primary Cardiologist: Dr Hobart Primary Electrophysiologist: Dr Cindie Referring Physician: Dr Hobart   Shelly Maldonado is a 79 y.o. female with a history of HTN, aortic atherosclerosis, chronic pancreatitis, ischemic enteritis, atrial fibrillation who presents for follow up in the [facility].  The patient was initially diagnosed with atrial fibrillation during hospitalization in [place] from 10/11/21-10/16/21. She was started in Eliquis  and metoprolol  at that time. She was admitted to [facility] 10/21/21 with ischemic enteritis with splenic infarct concerning for embolus as well as pneumonia. Embolic event not felt to be an Eliquis  failure as she has just started the medication. Plan was for TEE/DCCV after 3 weeks of anticoagulation. She was admitted for observation 11/08/21 with SOB and orthopnea, found to have bilateral pleural effusions, was diuresed. Seen by Olivia Pavy on 11/18/21 and her BB was increased. She has monitored her heart rates since then which have been elevated 100-120 bpm. Her BB was further increased.   Patient is s/p DCCV on 01/02/22. TEE was not performed. Unfortunately, she went back out of rhythm on on 01/09/22 with symptoms of intermittent lightheadedness and fatigue. She was seen by Dr Cindie and underwent afib ablation 06/06/22. She did have persistent afib post ablation and had another DCCV on 07/01/22 and 11/25/23. She was started on flecainide  for recurrent afib and is s/p another DCCV on 03/02/24.  She presented to the ED on 03/07/24 with SOB, BNP was elevated at 514. She was treated with Lasix  and her SOB resolved.   Patient returns for follow up for atrial fibrillation and flecainide  monitoring. She remains in SR today and feels well. She checks her Kardia mobile at home which has shown HR in the upper 50s and 60s. No bleeding issues on anticoagulation. She denies any interim symptoms of afib.    Today, she  denies symptoms of palpitations, chest pain, shortness of breath, orthopnea, PND, lower extremity edema, dizziness, presyncope, syncope, snoring, daytime somnolence, bleeding, or neurologic sequela. The patient is tolerating medications without difficulties and is otherwise without complaint today.    Atrial Fibrillation Risk Factors:  she does not have symptoms or diagnosis of sleep apnea. Negative sleep study 01/29/23 she does not have a history of rheumatic fever. she does not have a history of alcohol use. The patient does not have a history of early familial atrial fibrillation or other arrhythmias.   Atrial Fibrillation Management history:  Previous antiarrhythmic drugs: flecainide   Previous cardioversions: 01/02/22, 07/01/22, 11/25/23, 03/02/24 Previous ablations: 06/06/22 Anticoagulation history: Eliquis    Past Medical History:  Diagnosis Date   Atrial fibrillation (HCC)    Hypertension    Pancreatitis    Shingles     ROS- All systems are reviewed and negative except as per the HPI above.  Physical Exam: Vitals:   07/21/24 1118  BP: 122/70  Pulse: (!) 48  Weight: 78.3 kg  Height: 5' 4.5 (1.638 m)    GEN: Well nourished, well developed in no acute distress CARDIAC: Regular rate and rhythm, no murmurs, rubs, gallops RESPIRATORY:  Clear to auscultation without rales, wheezing or rhonchi  ABDOMEN: Soft, non-tender, non-distended EXTREMITIES:  No edema; No deformity    Wt Readings from Last 3 Encounters:  07/21/24 78.3 kg  06/06/24 77.6 kg  05/05/24 79.4 kg    EKG Interpretation Date/Time:  Thursday July 21 2024 11:33:57 EST Ventricular Rate:  48 PR Interval:  234 QRS Duration:  104 QT Interval:  482 QTC Calculation:  430 R Axis:   34  Text Interpretation: Sinus bradycardia with 1st degree A-V block with Premature atrial complexes Otherwise normal ECG When compared with ECG of 18-Apr-2024 15:10, No significant change was found Confirmed  by Aharon Carriere ([id number]) on 07/21/2024 11:47:12 AM    Echo 10/22/21 demonstrated   1. Left ventricular ejection fraction, by estimation, is 60 to 65%. The  left ventricle has normal function. The left ventricle has no regional  wall motion abnormalities. There is mild left ventricular hypertrophy of  the basal-septal segment. Left ventricular diastolic function could not be evaluated.   2. Right ventricular systolic function is normal. The right ventricular  size is normal. There is moderately elevated pulmonary artery systolic  pressure. The estimated right ventricular systolic pressure is 50.1 mmHg.   3. The pericardial effusion is anterior to the right ventricle.   4. The mitral valve is normal in structure. Trivial mitral valve  regurgitation. No evidence of mitral stenosis.   5. The aortic valve is normal in structure. Aortic valve regurgitation is  not visualized. No aortic stenosis is present.   6. The inferior vena cava is normal in size with greater than 50%  respiratory variability, suggesting right atrial pressure of 3 mmHg.   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   S/p afib ablation 06/06/22 (heavy scar burden in [place]) Patient appears to be maintaining SR Continue flecainide  100 mg BID Continue Lopressor  12.5 mg BID Continue Eliquis  5 mg BID Kardia mobile for home monitoring.   Secondary Hypercoagulable State (ICD10:  D68.69) The patient is at significant risk for stroke/thromboembolism based upon her CHA2DS2-VASc Score of 5.  Continue Apixaban  (Eliquis ). No bleeding issues.   High Risk Medication Monitoring  (ICD 10: J342684) Patient requires ongoing monitoring for anti-arrhythmic medication which has the potential to cause life threatening arrhythmias. Intervals on ECG acceptable for flecainide  monitoring. PR interval prolonged but stable.   HTN Stable on current regimen   Follow up in the AF clinic in 6 months.    Daril Kicks PA-C [facility] [address] [place], [place] [address] [phone] 07/21/2024 11:59 AM "

## 2024-07-25 ENCOUNTER — Ambulatory Visit: Admitting: Orthopedic Surgery

## 2024-07-25 ENCOUNTER — Encounter: Payer: Self-pay | Admitting: Orthopedic Surgery

## 2024-07-25 DIAGNOSIS — M65969 Unspecified synovitis and tenosynovitis, unspecified lower leg: Secondary | ICD-10-CM

## 2024-07-25 NOTE — Progress Notes (Signed)
" ° ° °  07/25/2024   Chief Complaint  Patient presents with   Knee Pain    Right    No diagnosis found.  What pharmacy do you use ? ___[facility]________  DOI/DOS/ Date: since beginning Dec 2025  Did you get better, worse or no change (Answer below)   Improved Injection helped a lot, not taking meds any longer but states the knee gives out, and has to grab something to keep from falling (not using cane or walker)     "

## 2024-07-25 NOTE — Progress Notes (Signed)
" ° °  Chief Complaint  Patient presents with   Knee Pain    Right    Shelly Maldonado is doing better. She has very little pain, but notices A click in the right knee at times and some weakness.  Knee Pain     PHYSICAL EXAM:  Stable in the sagittal and coronal plane on ligamentous exam no effusion some tenderness in the posteromedial corner of the joint with negative McMurray's   Assessment and Plan:   Encounter Diagnosis  Name Primary?   Synovitis of knee Yes    Resolved synovitis right knee  Possible residual band of scar tissue versus OA right knee accounting for the click  No surgery recommended at this time recommend brace to support the knee as there is no evidence of ligamentous instability  The posteromedial corner tenderness could represent meniscal tear and if symptoms become more frequent or unmanageable MRI would be in order  "

## 2024-08-09 ENCOUNTER — Other Ambulatory Visit: Payer: Self-pay

## 2024-08-09 ENCOUNTER — Inpatient Hospital Stay

## 2024-08-09 DIAGNOSIS — K55069 Acute infarction of intestine, part and extent unspecified: Secondary | ICD-10-CM

## 2024-08-09 DIAGNOSIS — D735 Infarction of spleen: Secondary | ICD-10-CM

## 2024-08-12 ENCOUNTER — Inpatient Hospital Stay

## 2024-08-16 ENCOUNTER — Inpatient Hospital Stay: Admitting: Physician Assistant

## 2024-09-28 ENCOUNTER — Inpatient Hospital Stay: Attending: Physician Assistant

## 2024-10-05 ENCOUNTER — Inpatient Hospital Stay: Admitting: Physician Assistant

## 2025-01-19 ENCOUNTER — Ambulatory Visit (HOSPITAL_COMMUNITY): Admitting: Physician Assistant
